# Patient Record
Sex: Male | Born: 1935
Health system: Southern US, Community
[De-identification: ages and names within clinical notes are randomized; demographics above are authoritative.]

## PROBLEM LIST (undated history)

## (undated) DIAGNOSIS — I10 Essential (primary) hypertension: Secondary | ICD-10-CM

## (undated) DIAGNOSIS — R7302 Impaired glucose tolerance (oral): Secondary | ICD-10-CM

## (undated) DIAGNOSIS — D692 Other nonthrombocytopenic purpura: Secondary | ICD-10-CM

## (undated) DIAGNOSIS — I4819 Other persistent atrial fibrillation: Secondary | ICD-10-CM

## (undated) DIAGNOSIS — M2550 Pain in unspecified joint: Secondary | ICD-10-CM

## (undated) DIAGNOSIS — H8110 Benign paroxysmal vertigo, unspecified ear: Secondary | ICD-10-CM

## (undated) DIAGNOSIS — E785 Hyperlipidemia, unspecified: Secondary | ICD-10-CM

## (undated) HISTORY — DX: Essential (primary) hypertension: I10

## (undated) HISTORY — DX: Pain in unspecified joint: M25.50

## (undated) HISTORY — PX: TONSILLECTOMY: SUR1361

## (undated) HISTORY — DX: Other persistent atrial fibrillation: I48.19

## (undated) HISTORY — DX: Impaired glucose tolerance (oral): R73.02

## (undated) HISTORY — DX: Benign paroxysmal vertigo, unspecified ear: H81.10

## (undated) HISTORY — PX: ORIF METACARPAL FRACTURE: SUR940

## (undated) HISTORY — DX: Hyperlipidemia, unspecified: E78.5

## (undated) HISTORY — DX: Other nonthrombocytopenic purpura: D69.2

---

## 2006-09-01 ENCOUNTER — Ambulatory Visit: Payer: Self-pay | Admitting: Internal Medicine

## 2006-09-15 ENCOUNTER — Ambulatory Visit: Payer: Self-pay | Admitting: Internal Medicine

## 2010-07-21 ENCOUNTER — Emergency Department (HOSPITAL_BASED_OUTPATIENT_CLINIC_OR_DEPARTMENT_OTHER)
Admission: EM | Admit: 2010-07-21 | Discharge: 2010-07-21 | Disposition: A | Discharge status: Other Acute Inpatient Hospital | Payer: Medicare Other | Attending: Emergency Medicine | Admitting: Emergency Medicine

## 2010-07-21 ENCOUNTER — Emergency Department (INDEPENDENT_AMBULATORY_CARE_PROVIDER_SITE_OTHER): Payer: Medicare Other

## 2010-07-21 DIAGNOSIS — W010XXA Fall on same level from slipping, tripping and stumbling without subsequent striking against object, initial encounter: Secondary | ICD-10-CM

## 2010-07-21 DIAGNOSIS — E7800 Pure hypercholesterolemia, unspecified: Secondary | ICD-10-CM | POA: Insufficient documentation

## 2010-07-21 DIAGNOSIS — Z8739 Personal history of other diseases of the musculoskeletal system and connective tissue: Secondary | ICD-10-CM | POA: Insufficient documentation

## 2010-07-21 DIAGNOSIS — S2249XA Multiple fractures of ribs, unspecified side, initial encounter for closed fracture: Secondary | ICD-10-CM | POA: Insufficient documentation

## 2010-07-21 DIAGNOSIS — R079 Chest pain, unspecified: Secondary | ICD-10-CM

## 2010-07-21 DIAGNOSIS — W19XXXA Unspecified fall, initial encounter: Secondary | ICD-10-CM | POA: Insufficient documentation

## 2010-07-21 DIAGNOSIS — Y92009 Unspecified place in unspecified non-institutional (private) residence as the place of occurrence of the external cause: Secondary | ICD-10-CM | POA: Insufficient documentation

## 2010-07-21 LAB — BASIC METABOLIC PANEL
BUN: 12 mg/dL (ref 6–23)
Calcium: 9.7 mg/dL (ref 8.4–10.5)
Creatinine, Ser: 0.8 mg/dL (ref 0.4–1.5)
GFR calc non Af Amer: >60 mL/min (ref >60)

## 2010-07-21 LAB — URINALYSIS, ROUTINE W REFLEX MICROSCOPIC
Leukocytes, UA: NEGATIVE
Nitrite: NEGATIVE
Specific Gravity, Urine: 1.024 (ref 1.005–1.030)
Urobilinogen, UA: 1 mg/dL (ref 0.0–1.0)
pH: 7 (ref 5.0–8.0)

## 2010-07-21 LAB — URINE MICROSCOPIC-ADD ON

## 2010-07-21 MED ORDER — IOHEXOL 300 MG/ML  SOLN
100.0000 mL | Freq: Once | INTRAMUSCULAR | Status: AC | PRN
  Administered 2010-07-21: 100 mL via INTRAVENOUS

## 2012-08-22 ENCOUNTER — Encounter (INDEPENDENT_AMBULATORY_CARE_PROVIDER_SITE_OTHER): Payer: Medicare Other | Admitting: *Deleted

## 2012-08-22 ENCOUNTER — Encounter: Payer: Self-pay | Admitting: Internal Medicine

## 2012-08-22 DIAGNOSIS — M79609 Pain in unspecified limb: Secondary | ICD-10-CM

## 2012-09-01 ENCOUNTER — Encounter: Payer: Self-pay | Admitting: Cardiovascular Disease

## 2012-09-01 ENCOUNTER — Ambulatory Visit (INDEPENDENT_AMBULATORY_CARE_PROVIDER_SITE_OTHER): Payer: Medicare Other | Admitting: Cardiovascular Disease

## 2012-09-01 VITALS — BP 152/74 | HR 72 | Ht 74.0 in | Wt 186.0 lb

## 2012-09-01 DIAGNOSIS — R0989 Other specified symptoms and signs involving the circulatory and respiratory systems: Secondary | ICD-10-CM

## 2012-09-01 DIAGNOSIS — E785 Hyperlipidemia, unspecified: Secondary | ICD-10-CM | POA: Insufficient documentation

## 2012-09-01 DIAGNOSIS — R55 Syncope and collapse: Secondary | ICD-10-CM

## 2012-09-01 DIAGNOSIS — R42 Dizziness and giddiness: Secondary | ICD-10-CM | POA: Insufficient documentation

## 2012-09-01 NOTE — Progress Notes (Signed)
   09/01/2012 Sean Bowman   May 30, 1927  [id number]  Primary Physician ARONSON,RICHARD A, MD Primary Cardiologist: Runell Gess MD Roseanne Reno   HPI:  Mr. Sean Bowman is an 79 year old married Caucasian male father of 2, grandfather to 5  grandchildren who was referred by Dr. Bernadene Person for cardiovascular evaluation because of several episodes of "pre-syncope" occurring 9 months ago.his crit aftereffect avows remarkable for treated hyperlipidemia otherwise is negative. He's never had a heart attack or stroke and denies chest pain or shortness of breath. Approximately 9 months ago he had several brief episodes of profound fatigue/presyncope but never actually lost consciousness. There were no other associated symptoms.   Current Outpatient Prescriptions  Medication Sig Dispense Refill  . aspirin 81 MG tablet Take 81 mg by mouth daily.      Marland Kitchen latanoprost (XALATAN) 0.005 % ophthalmic solution Place 1 drop into both eyes at bedtime.      . methocarbamol (ROBAXIN) 500 MG tablet Take 500 mg by mouth as needed.      . sildenafil (VIAGRA) 100 MG tablet Take 100 mg by mouth daily as needed for erectile dysfunction.      . simvastatin (ZOCOR) 40 MG tablet Take 40 mg by mouth every evening.       No current facility-administered medications for this visit.    No Known Allergies  History   Social History  . Marital Status: Married    Spouse Name: N/A    Number of Children: N/A  . Years of Education: N/A   Occupational History  . Not on file.   Social History Main Topics  . Smoking status: Never Smoker   . Smokeless tobacco: Not on file  . Alcohol Use: Yes     Comment: socially  . Drug Use: Not on file  . Sexually Active: Not on file   Other Topics Concern  . Not on file   Social History Narrative  . No narrative on file     Review of Systems: General: negative for chills, fever, night sweats or weight changes.  Cardiovascular: negative for chest pain, dyspnea on  exertion, edema, orthopnea, palpitations, paroxysmal nocturnal dyspnea or shortness of breath Dermatological: negative for rash Respiratory: negative for cough or wheezing Urologic: negative for hematuria Abdominal: negative for nausea, vomiting, diarrhea, bright red blood per rectum, melena, or hematemesis Neurologic: negative for visual changes, syncope, or dizziness All other systems reviewed and are otherwise negative except as noted above.    Blood pressure 152/74, pulse 72, height 6\' 2"  (1.88 m), weight 186 lb (84.369 kg).  General appearance: alert and no distress Neck: no adenopathy, no JVD, supple, symmetrical, trachea midline and soft right carotid bruit Lungs: clear to auscultation bilaterally Heart: regular rate and rhythm, S1, S2 normal, no murmur, click, rub or gallop Abdomen: soft, non-tender; bowel sounds normal; no masses,  no organomegaly Extremities: extremities normal, atraumatic, no cyanosis or edema Pulses: 2+ and symmetric  EKG normal sinus rhythm at 72 without ST or T wave changes  ASSESSMENT AND PLAN:   No problem-specific assessment & plan notes found for this encounter.      Runell Gess MD FACP,FACC,FAHA, [facility] 09/01/2012 2:01 PM

## 2012-09-01 NOTE — Patient Instructions (Addendum)
  We will see you back in follow up as needed  Dr Allyson Sabal has ordered a carotid doppler (ultrasound of your neck arteries)

## 2012-09-12 ENCOUNTER — Ambulatory Visit (HOSPITAL_COMMUNITY)
Admission: RE | Admit: 2012-09-12 | Discharge: 2012-09-12 | Disposition: A | Discharge status: Home / Self Care | Payer: Medicare Other | Source: Ambulatory Visit | Attending: Cardiovascular Disease | Admitting: Cardiovascular Disease

## 2012-09-12 DIAGNOSIS — R0989 Other specified symptoms and signs involving the circulatory and respiratory systems: Secondary | ICD-10-CM

## 2012-09-12 NOTE — Progress Notes (Signed)
Carotid Duplex Completed. Noelie Renfrow, RDMS, RVT  

## 2014-04-23 ENCOUNTER — Ambulatory Visit (HOSPITAL_COMMUNITY)
Admission: RE | Admit: 2014-04-23 | Discharge: 2014-04-23 | Disposition: A | Discharge status: Home / Self Care | Payer: Medicare Other | Source: Ambulatory Visit | Attending: Vascular Surgery | Admitting: Vascular Surgery

## 2014-04-23 ENCOUNTER — Other Ambulatory Visit (HOSPITAL_COMMUNITY): Payer: Self-pay | Admitting: Internal Medicine

## 2014-04-23 DIAGNOSIS — M7989 Other specified soft tissue disorders: Secondary | ICD-10-CM

## 2014-04-26 ENCOUNTER — Other Ambulatory Visit: Payer: Self-pay | Admitting: Dermatology

## 2014-12-19 ENCOUNTER — Other Ambulatory Visit: Payer: Self-pay | Admitting: Dermatology

## 2015-03-09 HISTORY — PX: CATARACT EXTRACTION W/ INTRAOCULAR LENS  IMPLANT, BILATERAL: SHX1307

## 2015-04-03 ENCOUNTER — Other Ambulatory Visit: Payer: Self-pay | Admitting: Dermatology

## 2015-06-03 DIAGNOSIS — H18411 Arcus senilis, right eye: Secondary | ICD-10-CM | POA: Diagnosis not present

## 2015-06-03 DIAGNOSIS — H401131 Primary open-angle glaucoma, bilateral, mild stage: Secondary | ICD-10-CM | POA: Diagnosis not present

## 2015-06-03 DIAGNOSIS — H02839 Dermatochalasis of unspecified eye, unspecified eyelid: Secondary | ICD-10-CM | POA: Diagnosis not present

## 2015-06-03 DIAGNOSIS — H18412 Arcus senilis, left eye: Secondary | ICD-10-CM | POA: Diagnosis not present

## 2015-06-05 DIAGNOSIS — D0362 Melanoma in situ of left upper limb, including shoulder: Secondary | ICD-10-CM | POA: Diagnosis not present

## 2015-07-18 DIAGNOSIS — I10 Essential (primary) hypertension: Secondary | ICD-10-CM | POA: Diagnosis not present

## 2015-07-18 DIAGNOSIS — M2550 Pain in unspecified joint: Secondary | ICD-10-CM | POA: Diagnosis not present

## 2015-07-18 DIAGNOSIS — E784 Other hyperlipidemia: Secondary | ICD-10-CM | POA: Diagnosis not present

## 2015-07-18 DIAGNOSIS — H8110 Benign paroxysmal vertigo, unspecified ear: Secondary | ICD-10-CM | POA: Diagnosis not present

## 2015-07-18 DIAGNOSIS — Z6823 Body mass index (BMI) 23.0-23.9, adult: Secondary | ICD-10-CM | POA: Diagnosis not present

## 2015-07-18 DIAGNOSIS — R7302 Impaired glucose tolerance (oral): Secondary | ICD-10-CM | POA: Diagnosis not present

## 2015-07-18 DIAGNOSIS — Z1389 Encounter for screening for other disorder: Secondary | ICD-10-CM | POA: Diagnosis not present

## 2015-07-18 DIAGNOSIS — D692 Other nonthrombocytopenic purpura: Secondary | ICD-10-CM | POA: Diagnosis not present

## 2015-08-06 DIAGNOSIS — Z85820 Personal history of malignant melanoma of skin: Secondary | ICD-10-CM | POA: Diagnosis not present

## 2015-08-06 DIAGNOSIS — D239 Other benign neoplasm of skin, unspecified: Secondary | ICD-10-CM | POA: Diagnosis not present

## 2015-08-06 DIAGNOSIS — L570 Actinic keratosis: Secondary | ICD-10-CM | POA: Diagnosis not present

## 2015-08-06 DIAGNOSIS — L821 Other seborrheic keratosis: Secondary | ICD-10-CM | POA: Diagnosis not present

## 2015-08-12 DIAGNOSIS — M25511 Pain in right shoulder: Secondary | ICD-10-CM | POA: Diagnosis not present

## 2015-08-12 DIAGNOSIS — Z6823 Body mass index (BMI) 23.0-23.9, adult: Secondary | ICD-10-CM | POA: Diagnosis not present

## 2015-12-23 DIAGNOSIS — H18413 Arcus senilis, bilateral: Secondary | ICD-10-CM | POA: Diagnosis not present

## 2015-12-23 DIAGNOSIS — H02839 Dermatochalasis of unspecified eye, unspecified eyelid: Secondary | ICD-10-CM | POA: Diagnosis not present

## 2015-12-23 DIAGNOSIS — H401131 Primary open-angle glaucoma, bilateral, mild stage: Secondary | ICD-10-CM | POA: Diagnosis not present

## 2016-01-12 DIAGNOSIS — I10 Essential (primary) hypertension: Secondary | ICD-10-CM | POA: Diagnosis not present

## 2016-01-12 DIAGNOSIS — Z0000 Encounter for general adult medical examination without abnormal findings: Secondary | ICD-10-CM | POA: Diagnosis not present

## 2016-01-12 DIAGNOSIS — Z125 Encounter for screening for malignant neoplasm of prostate: Secondary | ICD-10-CM | POA: Diagnosis not present

## 2016-01-12 DIAGNOSIS — R7302 Impaired glucose tolerance (oral): Secondary | ICD-10-CM | POA: Diagnosis not present

## 2016-01-28 DIAGNOSIS — Z0000 Encounter for general adult medical examination without abnormal findings: Secondary | ICD-10-CM | POA: Diagnosis not present

## 2016-01-28 DIAGNOSIS — D692 Other nonthrombocytopenic purpura: Secondary | ICD-10-CM | POA: Diagnosis not present

## 2016-01-28 DIAGNOSIS — Z6823 Body mass index (BMI) 23.0-23.9, adult: Secondary | ICD-10-CM | POA: Diagnosis not present

## 2016-01-28 DIAGNOSIS — E784 Other hyperlipidemia: Secondary | ICD-10-CM | POA: Diagnosis not present

## 2016-01-28 DIAGNOSIS — I10 Essential (primary) hypertension: Secondary | ICD-10-CM | POA: Diagnosis not present

## 2016-01-28 DIAGNOSIS — I4891 Unspecified atrial fibrillation: Secondary | ICD-10-CM | POA: Diagnosis not present

## 2016-01-28 DIAGNOSIS — R7302 Impaired glucose tolerance (oral): Secondary | ICD-10-CM | POA: Diagnosis not present

## 2016-01-28 DIAGNOSIS — M25511 Pain in right shoulder: Secondary | ICD-10-CM | POA: Diagnosis not present

## 2016-02-04 ENCOUNTER — Other Ambulatory Visit: Payer: Self-pay | Admitting: Dermatology

## 2016-02-04 DIAGNOSIS — H2511 Age-related nuclear cataract, right eye: Secondary | ICD-10-CM | POA: Diagnosis not present

## 2016-02-04 DIAGNOSIS — C44329 Squamous cell carcinoma of skin of other parts of face: Secondary | ICD-10-CM | POA: Diagnosis not present

## 2016-02-04 DIAGNOSIS — D0439 Carcinoma in situ of skin of other parts of face: Secondary | ICD-10-CM | POA: Diagnosis not present

## 2016-02-04 DIAGNOSIS — D0421 Carcinoma in situ of skin of right ear and external auricular canal: Secondary | ICD-10-CM | POA: Diagnosis not present

## 2016-02-10 ENCOUNTER — Ambulatory Visit
Admission: RE | Admit: 2016-02-10 | Discharge: 2016-02-10 | Disposition: A | Discharge status: Home / Self Care | Payer: Medicare Other | Source: Ambulatory Visit | Attending: Internal Medicine | Admitting: Internal Medicine

## 2016-02-10 ENCOUNTER — Other Ambulatory Visit: Payer: Self-pay | Admitting: Internal Medicine

## 2016-02-10 DIAGNOSIS — I4891 Unspecified atrial fibrillation: Secondary | ICD-10-CM

## 2016-02-10 DIAGNOSIS — R0602 Shortness of breath: Secondary | ICD-10-CM | POA: Diagnosis not present

## 2016-02-13 ENCOUNTER — Encounter: Payer: Self-pay | Admitting: Internal Medicine

## 2016-02-16 DIAGNOSIS — Z1212 Encounter for screening for malignant neoplasm of rectum: Secondary | ICD-10-CM | POA: Diagnosis not present

## 2016-02-16 DIAGNOSIS — H2511 Age-related nuclear cataract, right eye: Secondary | ICD-10-CM | POA: Diagnosis not present

## 2016-02-17 DIAGNOSIS — H2512 Age-related nuclear cataract, left eye: Secondary | ICD-10-CM | POA: Diagnosis not present

## 2016-02-18 DIAGNOSIS — D692 Other nonthrombocytopenic purpura: Secondary | ICD-10-CM | POA: Diagnosis not present

## 2016-02-18 DIAGNOSIS — I10 Essential (primary) hypertension: Secondary | ICD-10-CM | POA: Diagnosis not present

## 2016-02-18 DIAGNOSIS — R7302 Impaired glucose tolerance (oral): Secondary | ICD-10-CM | POA: Diagnosis not present

## 2016-02-18 DIAGNOSIS — I4891 Unspecified atrial fibrillation: Secondary | ICD-10-CM | POA: Diagnosis not present

## 2016-02-18 DIAGNOSIS — Z6823 Body mass index (BMI) 23.0-23.9, adult: Secondary | ICD-10-CM | POA: Diagnosis not present

## 2016-02-18 DIAGNOSIS — E784 Other hyperlipidemia: Secondary | ICD-10-CM | POA: Diagnosis not present

## 2016-02-18 DIAGNOSIS — H8113 Benign paroxysmal vertigo, bilateral: Secondary | ICD-10-CM | POA: Diagnosis not present

## 2016-02-18 DIAGNOSIS — M2550 Pain in unspecified joint: Secondary | ICD-10-CM | POA: Diagnosis not present

## 2016-02-19 ENCOUNTER — Ambulatory Visit (INDEPENDENT_AMBULATORY_CARE_PROVIDER_SITE_OTHER): Payer: Medicare Other | Admitting: Internal Medicine

## 2016-02-19 VITALS — BP 122/84 | HR 99 | Ht 74.0 in | Wt 182.4 lb

## 2016-02-19 DIAGNOSIS — I481 Persistent atrial fibrillation: Secondary | ICD-10-CM | POA: Diagnosis not present

## 2016-02-19 DIAGNOSIS — I4819 Other persistent atrial fibrillation: Secondary | ICD-10-CM

## 2016-02-19 DIAGNOSIS — I10 Essential (primary) hypertension: Secondary | ICD-10-CM | POA: Diagnosis not present

## 2016-02-19 MED ORDER — DILTIAZEM HCL ER COATED BEADS 180 MG PO CP24: 180.0000 mg | ORAL_CAPSULE | Freq: Every day | ORAL | 3 refills | Status: DC

## 2016-02-19 NOTE — Progress Notes (Signed)
Electrophysiology Office Note   Date:  02/19/2016   ID:  Sean Bowman, DOB 05-19-1927, MRN [id number]  PCP:  Sean Bowman, Sean Bowman  Cardiologist:  Sean Bowman Primary Electrophysiologist: Sean Bowman, Sean Bowman    Chief Complaint  Patient presents with  . Atrial Fibrillation     History of Present Illness: Sean Bowman is Bowman 82 y.o. male who presents today for electrophysiology evaluation.   He recently presented to Sean Bowman for routine follow-up and was found to have afib with RVR.  He was started on cardizem.  Plans for eliquis were made though he did not start this until last evening.  He is minimally symptomatic with his afib.  He has only occasional palpitations.  He is quite active for his age.  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  Today, he denies symptoms of chest pain, shortness of breath, orthopnea, PND, lower extremity edema, claudication, dizziness, presyncope, syncope, bleeding, or neurologic sequela. The patient is tolerating medications without difficulties and is otherwise without complaint today.    Past Medical History:  Diagnosis Date  . Arthralgia   . Benign positional vertigo   . HTN (hypertension)   . Hyperlipidemia   . Impaired glucose tolerance   . Persistent atrial fibrillation (HCC)   . Senile purpura (HCC)    Past Surgical History:  Procedure Laterality Date  . TOE SURGERY       Current Outpatient Prescriptions  Medication Sig Dispense Refill  . latanoprost (XALATAN) 0.005 % ophthalmic solution Place 1 drop into both eyes at bedtime.    . naproxen (NAPROSYN) 500 MG tablet Take 500 mg by mouth 2 (two) times daily with Bowman meal.    . sildenafil (VIAGRA) 100 MG tablet Take 100 mg by mouth daily as needed for erectile dysfunction.    . simvastatin (ZOCOR) 40 MG tablet Take 40 mg by mouth every evening.    . diltiazem (CARDIZEM CD) 180 MG 24 hr capsule Take 1 capsule (180 mg total) by mouth daily. 90 capsule 3   No current  facility-administered medications for this visit.     Allergies:   Patient has no known allergies.   Social History:  The patient  reports that he has never smoked. He has never used smokeless tobacco. He reports that he drinks alcohol. He reports that he does not use drugs.   Family History:  The patient's  family history includes Alzheimer's disease in his father and mother; Stroke in his mother.    ROS:  Please see the history of present illness.   All other systems are reviewed and negative.    PHYSICAL EXAM: VS:  BP 122/84   Pulse 99   Ht 6\' 2"  (1.88 m)   Wt 182 lb 6.4 oz (82.7 kg)   BMI 23.42 kg/m  , BMI Body mass index is 23.42 kg/m. GEN: Well nourished, well developed, in no acute distress  HEENT: normal  Neck: no JVD, carotid bruits, or masses Cardiac: iRRR; no murmurs, rubs, or gallops,no edema  Respiratory:  clear to auscultation bilaterally, normal work of breathing GI: soft, nontender, nondistended, + BS MS: no deformity or atrophy  Skin: warm and dry  Neuro:  Strength and sensation are intact Psych: euthymic mood, full affect  EKG:  EKG is ordered today. The ekg ordered today shows afib 99 bpm   Wt Readings from Last 3 Encounters:  02/19/16 182 lb 6.4 oz (82.7 kg)  09/01/12 186 lb (84.4 kg)  Other studies Reviewed: Additional studies/ records that were reviewed today include: 9 pages of records from [facility]   Review of the above records today demonstrates: ekg from 01/28/16 reveals afib with RVR   ASSESSMENT AND PLAN:  1.  Persistent afib Minimally symptomatic and determined on routine physical exam. Therapeutic strategies for afib including rate and rhythm control were discussed in detail with the patient today. Risk, benefits, and alternatives to each approach were discussed in detail.  He would like to proceed with cardioversion once appropriately anticoagulated.  chads2vasc score is 70 (age, htn).  Continue eliquis 5mg  BID and stop  ASA  Change cardizem to cardizem CD 180mg  daily for better rate control Echo to evaluate for structural abnormalities related to afib.  Return to see me 4 weeks after cardioversion  2. HTN Stable No change required today    Current medicines are reviewed at length with the patient today.   The patient does not have concerns regarding his medicines.  The following changes were made today:  none  Labs/ tests ordered today include:  Orders Placed This Encounter  Procedures  . Basic metabolic panel  . CBC with Differential  . EKG 12-Lead  . ECHOCARDIOGRAM COMPLETE     Signed, Sean Range, Sean Bowman  02/19/2016 4:42 PM     [facility] [address] [place] [address] [phone] (office) [phone] (fax)

## 2016-02-19 NOTE — Patient Instructions (Addendum)
Medication Instructions:  Your physician has recommended you make the following change in your medication:  1) Stop Aspirin 2) Increase Cardizem to 180 mg daily  Labwork: Your physician recommends that you return for lab work on 03/09/16--do not have to fast   Testing/Procedures:   Your physician has requested that you have an echocardiogram. Echocardiography is a painless test that uses sound waves to create images of your heart. It provides your doctor with information about the size and shape of your heart and how well your heart's chambers and valves are working. This procedure takes approximately one hour. There are no restrictions for this procedure.   Your physician has recommended that you have a Cardioversion (DCCV). Electrical Cardioversion uses a jolt of electricity to your heart either through paddles or wired patches attached to your chest. This is a controlled, usually prescheduled, procedure. Defibrillation is done under light anesthesia in the hospital, and you usually go home the day of the procedure. This is done to get your heart back into a normal rhythm. You are not awake for the procedure. Please see the instruction sheet given to you today.---03/15/16   Please arrive at [facility] at ________ Do not eat or drink after midnight the night before your procedure Okay to take your medications with a small sip of water the morning of procedure Will need someone to drive you home after the proceudre   Follow-Up: Your physician recommends that you schedule a follow-up appointment in: 8 weeks with Dr Johney Frame   Any Other Special Instructions Will Be Listed Below (If Applicable).     If you need a refill on your cardiac medications before your next appointment, please call your pharmacy.

## 2016-02-23 ENCOUNTER — Telehealth: Payer: Self-pay | Admitting: Internal Medicine

## 2016-02-23 NOTE — Telephone Encounter (Signed)
New Message  Pt call requesting to speak with RN about his medication. Pt did not want to disclose much information . Pt would rather speak with RN. Please call back to discuss

## 2016-02-23 NOTE — Telephone Encounter (Signed)
Left a message for the patient and let him know I will work on scheduling his DCCV for Jan tomorrow and call him back

## 2016-02-24 NOTE — Telephone Encounter (Signed)
Called patient back to discuss DCCV.  I have it scheduled for 03/15/16 at 10:00.  Patient is to be at the [facility] at 8:30am  Labs are on 01/02

## 2016-02-25 ENCOUNTER — Telehealth: Payer: Self-pay | Admitting: Internal Medicine

## 2016-02-25 NOTE — Telephone Encounter (Signed)
New message     Pt verbalized that he is returning call for rn and [phone] is a cell

## 2016-02-25 NOTE — Telephone Encounter (Signed)
New Message;    He need to talk to you about the dates for his procedure.

## 2016-02-25 NOTE — Telephone Encounter (Signed)
Left message for patient to return my call.

## 2016-02-27 NOTE — Telephone Encounter (Signed)
Late entry: Spoke with patient and he is going to call his eye MD and get back to me.

## 2016-02-27 NOTE — Telephone Encounter (Signed)
Spoke with patient and he is still not taking the Eliquis bid.  We discussed this in great detail for over 15 min.  He is going to check his medications again and let me know that he has Eliquis.  Seems very confused in regards to what he is taking.  I let him know that the DCCV can not be scheduled until he has been taking the blood thinner twice daily fior 3 weeks.  He thinks 1/18 or 1/19 will work.  He will call me back Tues after Christmas

## 2016-03-02 MED ORDER — APIXABAN 5 MG PO TABS: 5.0000 mg | ORAL_TABLET | Freq: Two times a day (BID) | ORAL | 2 refills | Status: DC

## 2016-03-02 NOTE — Telephone Encounter (Signed)
Patient walked in this morning to discuss his medications.  He is not taking Eliquis as Dr Jenel Lucks not says to continue.  It was my understanding Dr Jacky Kindle had started this medication for him.  He says he is not taking.  I have given him samples a 30 day free card and let him know will not be able to schedule the DCCV until week of 03/29/16.  I let him know it is very important that he not miss any doses of the Eliquis prior to the DCCV.

## 2016-03-09 ENCOUNTER — Other Ambulatory Visit: Payer: Medicare Other | Admitting: *Deleted

## 2016-03-09 DIAGNOSIS — I481 Persistent atrial fibrillation: Secondary | ICD-10-CM | POA: Diagnosis not present

## 2016-03-09 DIAGNOSIS — I4819 Other persistent atrial fibrillation: Secondary | ICD-10-CM

## 2016-03-09 NOTE — Addendum Note (Signed)
Addended by: Tonita Phoenix on: 03/09/2016 09:02 AM   Modules accepted: Orders

## 2016-03-10 LAB — CBC WITH DIFFERENTIAL/PLATELET
BASOS ABS: 0 10*3/uL (ref 0.0–0.2)
Basos: 0 %
EOS (ABSOLUTE): 0.5 10*3/uL — ABNORMAL HIGH (ref 0.0–0.4)
Eos: 5 %
HEMOGLOBIN: 13.8 g/dL (ref 13.0–17.7)
Hematocrit: 41.2 % (ref 37.5–51.0)
Immature Grans (Abs): 0 10*3/uL (ref 0.0–0.1)
Immature Granulocytes: 0 %
LYMPHS ABS: 2.6 10*3/uL (ref 0.7–3.1)
Lymphs: 27 %
MCH: 31.6 pg (ref 26.6–33.0)
MCHC: 33.5 g/dL (ref 31.5–35.7)
MCV: 94 fL (ref 79–97)
MONOCYTES: 12 %
Monocytes Absolute: 1.1 10*3/uL — ABNORMAL HIGH (ref 0.1–0.9)
NEUTROS ABS: 5.5 10*3/uL (ref 1.4–7.0)
Neutrophils: 56 %
Platelets: 221 10*3/uL (ref 150–379)
RBC: 4.37 x10E6/uL (ref 4.14–5.80)
RDW: 14 % (ref 12.3–15.4)
WBC: 9.8 10*3/uL (ref 3.4–10.8)

## 2016-03-10 LAB — BASIC METABOLIC PANEL
BUN / CREAT RATIO: 18 (ref 10–24)
BUN: 20 mg/dL (ref 8–27)
CHLORIDE: 102 mmol/L (ref 96–106)
CO2: 25 mmol/L (ref 18–29)
Calcium: 9.2 mg/dL (ref 8.6–10.2)
Creatinine, Ser: 1.14 mg/dL (ref 0.76–1.27)
GFR calc non Af Amer: 58 mL/min/{1.73_m2} — ABNORMAL LOW (ref >59)
GFR, EST AFRICAN AMERICAN: 67 mL/min/{1.73_m2} (ref >59)
Glucose: 90 mg/dL (ref 65–99)
POTASSIUM: 4.4 mmol/L (ref 3.5–5.2)
Sodium: 142 mmol/L (ref 134–144)

## 2016-03-11 ENCOUNTER — Telehealth: Payer: Self-pay | Admitting: Internal Medicine

## 2016-03-11 MED ORDER — APIXABAN 5 MG PO TABS: 5.0000 mg | ORAL_TABLET | Freq: Two times a day (BID) | ORAL | 2 refills | Status: DC

## 2016-03-11 NOTE — Telephone Encounter (Signed)
He is going to resend me a fax with some questions.

## 2016-03-11 NOTE — Telephone Encounter (Signed)
New Message  Pt call requesting to speak with RN to f/u to see if his fax was received. Please call back to confirm.

## 2016-03-12 DIAGNOSIS — H2512 Age-related nuclear cataract, left eye: Secondary | ICD-10-CM | POA: Diagnosis not present

## 2016-03-12 DIAGNOSIS — H25812 Combined forms of age-related cataract, left eye: Secondary | ICD-10-CM | POA: Diagnosis not present

## 2016-03-13 ENCOUNTER — Other Ambulatory Visit: Payer: Self-pay | Admitting: Nurse Practitioner

## 2016-03-15 ENCOUNTER — Encounter (HOSPITAL_COMMUNITY): Admission: RE | Payer: Self-pay | Source: Ambulatory Visit

## 2016-03-15 ENCOUNTER — Ambulatory Visit (HOSPITAL_COMMUNITY): Admission: RE | Admit: 2016-03-15 | Payer: Medicare Other | Source: Ambulatory Visit | Admitting: Cardiology

## 2016-03-15 ENCOUNTER — Encounter (HOSPITAL_COMMUNITY): Payer: Self-pay | Admitting: Certified Registered"

## 2016-03-15 SURGERY — CARDIOVERSION
Anesthesia: Monitor Anesthesia Care

## 2016-03-15 NOTE — Telephone Encounter (Signed)
Discussed with Dr Johney Frame and will need to take the Eliquis for at least 4 weeks post DCCV.  DCCV is scheduled for 03/29/16 with Dr Royann Shivers at 10:00am. He is to be at the [facility] at 8:30am.  NPO after midnight but okay to take all morning medications with a small sip of water.  Spoke with patient and let him know date and time and he would like to discuss with Dr Johney Frame.  He says he wants to have an appointment with MD doing DCCV.  I have tried to explain to the patient that this is not necessary as Dr Johney Frame has explained the procedure to him.  He would like for Dr Johney Frame to call him

## 2016-03-19 ENCOUNTER — Telehealth: Payer: Self-pay | Admitting: Internal Medicine

## 2016-03-19 NOTE — Telephone Encounter (Signed)
Returned call to patient no answer.LMTC. 

## 2016-03-19 NOTE — Telephone Encounter (Signed)
New Message   Pt has some questions pertaining to upcoming test. Would like a call back.

## 2016-03-22 ENCOUNTER — Telehealth (HOSPITAL_COMMUNITY): Payer: Self-pay | Admitting: Internal Medicine

## 2016-03-22 NOTE — Telephone Encounter (Signed)
Follow up ° °Pt voiced returning nurses call. °

## 2016-03-22 NOTE — Telephone Encounter (Signed)
Pt called to discuss cardioversion. Pt states he would like to speak to Dr Johney Frame personally about risks. Pt states echocardiogram has not been scheduled yet, pt advised I will ask [facility] to contact him to get echo scheduled.   Pt advised I will forward to Dr Allred/Kelly to follow up with him on Wednesday.

## 2016-03-23 ENCOUNTER — Telehealth: Payer: Self-pay | Admitting: Internal Medicine

## 2016-03-23 NOTE — Telephone Encounter (Signed)
New Message:  I called Sean Bowman to get him schedule for an echo and the Sean Bowman asked if this is something Dr. Johney Frame wanted him to have prior to his Cardioversion. Please f/u with Sean Bowman and further advise.

## 2016-03-23 NOTE — Telephone Encounter (Signed)
Pt scheduled for DCCV on 1/22 and for echo on 1/26.  Pt wanted to know if echo needed to be before DCCV.  Advised pt that it did not, either way would have been fine. Pt concerned about making sure Dr. Johney Frame was aware that echo would be after DCCV.  Advised that I will send this message to Dr. Johney Frame and his nurse and have her call if by chance echo did need to be prior to DCCV.  Pt appreciative for assistance.

## 2016-03-23 NOTE — Telephone Encounter (Signed)
New Message   Pt returning call from [place].

## 2016-03-26 ENCOUNTER — Other Ambulatory Visit: Payer: Self-pay | Admitting: Nurse Practitioner

## 2016-03-29 ENCOUNTER — Ambulatory Visit (HOSPITAL_COMMUNITY): Payer: Medicare Other | Admitting: Certified Registered"

## 2016-03-29 ENCOUNTER — Ambulatory Visit (HOSPITAL_COMMUNITY)
Admission: RE | Admit: 2016-03-29 | Discharge: 2016-03-29 | Disposition: A | Discharge status: Home / Self Care | Payer: Medicare Other | Source: Ambulatory Visit | Attending: Cardiovascular Disease | Admitting: Cardiovascular Disease

## 2016-03-29 ENCOUNTER — Telehealth (HOSPITAL_COMMUNITY): Payer: Self-pay | Admitting: *Deleted

## 2016-03-29 ENCOUNTER — Encounter (HOSPITAL_COMMUNITY)
Admission: RE | Disposition: A | Discharge status: Home / Self Care | Payer: Self-pay | Source: Ambulatory Visit | Attending: Cardiovascular Disease

## 2016-03-29 ENCOUNTER — Encounter (HOSPITAL_COMMUNITY): Payer: Self-pay | Admitting: *Deleted

## 2016-03-29 DIAGNOSIS — E785 Hyperlipidemia, unspecified: Secondary | ICD-10-CM | POA: Insufficient documentation

## 2016-03-29 DIAGNOSIS — H8110 Benign paroxysmal vertigo, unspecified ear: Secondary | ICD-10-CM | POA: Insufficient documentation

## 2016-03-29 DIAGNOSIS — R42 Dizziness and giddiness: Secondary | ICD-10-CM | POA: Diagnosis not present

## 2016-03-29 DIAGNOSIS — Z79899 Other long term (current) drug therapy: Secondary | ICD-10-CM | POA: Insufficient documentation

## 2016-03-29 DIAGNOSIS — Z7901 Long term (current) use of anticoagulants: Secondary | ICD-10-CM | POA: Insufficient documentation

## 2016-03-29 DIAGNOSIS — I10 Essential (primary) hypertension: Secondary | ICD-10-CM | POA: Insufficient documentation

## 2016-03-29 DIAGNOSIS — I481 Persistent atrial fibrillation: Secondary | ICD-10-CM

## 2016-03-29 DIAGNOSIS — I4891 Unspecified atrial fibrillation: Secondary | ICD-10-CM | POA: Diagnosis not present

## 2016-03-29 HISTORY — PX: CARDIOVERSION: SHX1299

## 2016-03-29 SURGERY — CARDIOVERSION
Anesthesia: General

## 2016-03-29 MED ORDER — SODIUM CHLORIDE 0.9 % IV SOLN: INTRAVENOUS | Status: DC

## 2016-03-29 MED ORDER — PROPOFOL 10 MG/ML IV BOLUS
INTRAVENOUS | Status: DC | PRN
  Administered 2016-03-29: 70 mg via INTRAVENOUS

## 2016-03-29 MED ORDER — LIDOCAINE 2% (20 MG/ML) 5 ML SYRINGE
INTRAMUSCULAR | Status: DC | PRN
  Administered 2016-03-29: 40 mg via INTRAVENOUS

## 2016-03-29 MED ORDER — APIXABAN 5 MG PO TABS
5.0000 mg | ORAL_TABLET | Freq: Once | ORAL | Status: AC
  Administered 2016-03-29: 5 mg via ORAL
  Filled 2016-03-29 (×2): qty 1

## 2016-03-29 MED ORDER — DILTIAZEM HCL ER COATED BEADS 180 MG PO CP24: 180.0000 mg | ORAL_CAPSULE | Freq: Two times a day (BID) | ORAL | 3 refills | Status: DC

## 2016-03-29 NOTE — Anesthesia Preprocedure Evaluation (Signed)
Anesthesia Evaluation  Patient identified by MRN, date of birth, ID band Patient awake    Reviewed: Allergy & Precautions, NPO status , Patient's Chart, lab work & pertinent test results  Airway Mallampati: I  TM Distance: >3 FB     Dental  (+) Teeth Intact   Pulmonary neg pulmonary ROS,    breath sounds clear to auscultation       Cardiovascular hypertension, + Peripheral Vascular Disease  + dysrhythmias  Rhythm:Irregular Rate:Normal     Neuro/Psych negative neurological ROS     GI/Hepatic negative GI ROS, Neg liver ROS,   Endo/Other  negative endocrine ROS  Renal/GU negative Renal ROS     Musculoskeletal   Abdominal   Peds  Hematology   Anesthesia Other Findings   Reproductive/Obstetrics                             Anesthesia Physical Anesthesia Plan  ASA: II  Anesthesia Plan: General   Post-op Pain Management:    Induction: Intravenous  Airway Management Planned: Simple Face Mask  Additional Equipment:   Intra-op Plan:   Post-operative Plan:   Informed Consent: I have reviewed the patients History and Physical, chart, labs and discussed the procedure including the risks, benefits and alternatives for the proposed anesthesia with the patient or authorized representative who has indicated his/her understanding and acceptance.   Dental advisory given  Plan Discussed with: CRNA  Anesthesia Plan Comments:         Anesthesia Quick Evaluation

## 2016-03-29 NOTE — Anesthesia Postprocedure Evaluation (Addendum)
Anesthesia Post Note  Patient: Sean Bowman  Procedure(s) Performed: Procedure(s) (LRB): CARDIOVERSION (N/A)  Patient location during evaluation: Endoscopy Anesthesia Type: General Level of consciousness: awake and alert Pain management: pain level controlled Vital Signs Assessment: post-procedure vital signs reviewed and stable Respiratory status: spontaneous breathing, nonlabored ventilation, respiratory function stable and patient connected to nasal cannula oxygen Cardiovascular status: stable and blood pressure returned to baseline Anesthetic complications: no       Last Vitals:  Vitals:   03/29/16 0950 03/29/16 1000  BP: 107/74 112/69  Pulse: (!) 114 (!) 110  Resp: 15 14  Temp: 36.3 C     Last Pain:  Vitals:   03/29/16 0950  TempSrc: Oral                 Hilmar Moldovan,Cage TERRILL

## 2016-03-29 NOTE — Discharge Instructions (Signed)
Electrical Cardioversion, Care After °This sheet gives you information about how to care for yourself after your procedure. Your health care provider may also give you more specific instructions. If you have problems or questions, contact your health care provider. °What can I expect after the procedure? °After the procedure, it is common to have: °· Some redness on the skin where the shocks were given. °Follow these instructions at home: °· Do not drive for 24 hours if you were given a medicine to help you relax (sedative). °· Take over-the-counter and prescription medicines only as told by your health care provider. °· Ask your health care provider how to check your pulse. Check it often. °· Rest for 48 hours after the procedure or as told by your health care provider. °· Avoid or limit your caffeine use as told by your health care provider. °Contact a health care provider if: °· You feel like your heart is beating too quickly or your pulse is not regular. °· You have a serious muscle cramp that does not go away. °Get help right away if: °· You have discomfort in your chest. °· You are dizzy or you feel faint. °· You have trouble breathing or you are short of breath. °· Your speech is slurred. °· You have trouble moving an arm or leg on one side of your body. °· Your fingers or toes turn cold or blue. °This information is not intended to replace advice given to you by your health care provider. Make sure you discuss any questions you have with your health care provider. °Document Released: 12/13/2012 Document Revised: 09/26/2015 Document Reviewed: 08/29/2015 °Elsevier Interactive Patient Education © 2017 [employer] ° °

## 2016-03-29 NOTE — Op Note (Signed)
Procedure: Electrical Cardioversion Indications:  Atrial Fibrillation  Procedure Details:  Consent: Risks of procedure as well as the alternatives and risks of each were explained to the (patient/caregiver).  Consent for procedure obtained.  Time Out: Verified patient identification, verified procedure, site/side was marked, verified correct patient position, special equipment/implants available, medications/allergies/relevent history reviewed, required imaging and test results available.  Performed  Patient placed on cardiac monitor, pulse oximetry, supplemental oxygen as necessary.  Sedation given: IV propofol, Dr. Jacklynn Bue. Pacer pads placed anterior and posterior chest.  Cardioverted 1 time(s).  Cardioversion with synchronized biphasic 120J shock. The patient converted to NSR for approximately 1 minute, then returned to atrial fibrillation with RVR. No further attempts were made to convert the rhythm.  Evaluation: Findings: Post procedure EKG shows: Atrial Fibrillation Complications: None Patient did tolerate procedure well.  Consider repeat DC cardioversion after initiation of antiarrhythmics. The ventricular rate was 130-150s before and after the cardioversion. Will double the dose of diltiazem. Due to the risk of adverse drug interactions, will stop the simvastatin. If diltiazem will be long term therapy, consider an alternative statin.  Time Spent Directly with the Patient:  30 minutes   Sean Bowman 03/29/2016, 8:58 AM

## 2016-03-29 NOTE — Transfer of Care (Signed)
Immediate Anesthesia Transfer of Care Note  Patient: Sean Bowman  Procedure(s) Performed: Procedure(s): CARDIOVERSION (N/A)  Patient Location: Endoscopy Unit  Anesthesia Type:General  Level of Consciousness: awake, alert , oriented and patient cooperative  Airway & Oxygen Therapy: Patient Spontanous Breathing and Patient connected to nasal cannula oxygen  Post-op Assessment: Report given to RN, Post -op Vital signs reviewed and stable and Patient moving all extremities  Post vital signs: Reviewed and stable  Last Vitals:  Vitals:   03/29/16 0816  BP: (!) 132/95  Pulse: (!) 121  Resp: 18  Temp: 36.4 C    Last Pain:  Vitals:   03/29/16 0816  TempSrc: Oral         Complications: No apparent anesthesia complications

## 2016-03-29 NOTE — H&P (Signed)
   Chief Complaint:  atrial fibrillation Cardiologist: Berry/Allred  HPI:  This is a 83 y.o. male with a past medical history significant for HTN and persistent atrial fibrillation, here for elective DC cardioversion after >4 weeks of uninterrupted anticoagulation.   PMHx:  Past Medical History:  Diagnosis Date  . Arthralgia   . Benign positional vertigo   . HTN (hypertension)   . Hyperlipidemia   . Impaired glucose tolerance   . Persistent atrial fibrillation (HCC)   . Senile purpura (HCC)     Past Surgical History:  Procedure Laterality Date  . TOE SURGERY      FAMHx:  Family History  Problem Relation Age of Onset  . Stroke Mother   . Alzheimer's disease Mother   . Alzheimer's disease Father     SOCHx:   reports that he has never smoked. He has never used smokeless tobacco. He reports that he drinks alcohol. He reports that he does not use drugs.  ALLERGIES:  No Known Allergies  ROS: Pertinent items are noted in HPI.  HOME MEDS: Medications Prior to Admission  Medication Sig Dispense Refill  . apixaban (ELIQUIS) 5 MG TABS tablet Take 1 tablet (5 mg total) by mouth 2 (two) times daily. 60 tablet 2  . diltiazem (CARDIZEM CD) 180 MG 24 hr capsule Take 1 capsule (180 mg total) by mouth daily. 90 capsule 3  . latanoprost (XALATAN) 0.005 % ophthalmic solution Place 1 drop into both eyes at bedtime.    . sildenafil (VIAGRA) 100 MG tablet Take 100 mg by mouth daily as needed for erectile dysfunction.    . simvastatin (ZOCOR) 40 MG tablet Take 40 mg by mouth every evening.      VITALS: Blood pressure (!) 132/95, pulse (!) 121, temperature 97.6 F (36.4 C), temperature source Oral, resp. rate 18, height 6\' 2"  (1.88 m), weight 82.6 kg (182 lb), SpO2 97 %.  EXAM:  General: Alert, oriented x3, no distress Head: no evidence of trauma, PERRL, EOMI, no exophtalmos or lid lag, no myxedema, no xanthelasma; normal ears, nose and oropharynx Neck:  jugular venous pulsations  and no hepatojugular reflux; brisk carotid pulses without delay and no carotid bruits Chest: clear to auscultation, no signs of consolidation by percussion or palpation, normal fremitus, symmetrical and full respiratory excursions Cardiovascular: normal position and quality of the apical impulse, rapid irregular rhythm, normal first heart sound and second heart sounds, no rubs or gallops, nomurmur Abdomen: no tenderness or distention, no masses by palpation, no abnormal pulsatility or arterial bruits, normal bowel sounds, no hepatosplenomegaly Extremities: no clubbing, cyanosis or edema; 2+ radial, ulnar and brachial pulses bilaterally; 2+ right femoral, posterior tibial and dorsalis pedis pulses; 2+ left femoral, posterior tibial and dorsalis pedis pulses; no subclavian or femoral bruits Neurological: grossly nonfocal   IMPRESSION: AFib with RVR. Appropriate uninterrupted anticoagulation.  PLAN: DC cardioversion. If unsuccessful, needs enhanced rate control meds. Probably should switch to an alternative statin if he remains on diltiazem. Either way, should remain on anticoagulants indefinitely (CHADSVasc 3 - age 12, HTN). This procedure has been fully reviewed with the patient and written informed consent has been obtained.   Thurmon Fair, MD, [facility] [phone] office [phone] pager  03/29/2016, 8:53 AM

## 2016-03-29 NOTE — Telephone Encounter (Signed)
-----   Message from Shona Simpson, RN sent at 03/29/2016 10:34 AM EST -----   ----- Message ----- From: Thurmon Fair, MD Sent: 03/29/2016  10:23 AM To: Hillis Range, MD, Lutricia Horsfall, #  Billie, Please make him an AF clinic appt later this week (early AF recurrence after DCCV) to discuss antiarrhythmics. Thanks [employer]

## 2016-03-29 NOTE — Telephone Encounter (Signed)
RE: appt with afib clinic this week.  LMOM for pt to clbk to sched appt.  LM on home and work.

## 2016-03-30 ENCOUNTER — Encounter (HOSPITAL_COMMUNITY): Payer: Self-pay | Admitting: Cardiovascular Disease

## 2016-03-31 ENCOUNTER — Ambulatory Visit (HOSPITAL_COMMUNITY): Payer: Medicare Other | Admitting: Nurse Practitioner

## 2016-04-01 ENCOUNTER — Encounter (HOSPITAL_COMMUNITY): Payer: Self-pay | Admitting: Nurse Practitioner

## 2016-04-01 ENCOUNTER — Telehealth: Payer: Self-pay | Admitting: Internal Medicine

## 2016-04-01 ENCOUNTER — Ambulatory Visit (HOSPITAL_COMMUNITY)
Admission: RE | Admit: 2016-04-01 | Discharge: 2016-04-01 | Disposition: A | Discharge status: Home / Self Care | Payer: Medicare Other | Source: Ambulatory Visit | Attending: Nurse Practitioner | Admitting: Nurse Practitioner

## 2016-04-01 VITALS — BP 130/74 | HR 90 | Ht 74.0 in | Wt 181.4 lb

## 2016-04-01 DIAGNOSIS — R600 Localized edema: Secondary | ICD-10-CM | POA: Diagnosis not present

## 2016-04-01 DIAGNOSIS — I481 Persistent atrial fibrillation: Secondary | ICD-10-CM | POA: Diagnosis not present

## 2016-04-01 DIAGNOSIS — I4819 Other persistent atrial fibrillation: Secondary | ICD-10-CM

## 2016-04-01 DIAGNOSIS — Z7901 Long term (current) use of anticoagulants: Secondary | ICD-10-CM | POA: Insufficient documentation

## 2016-04-01 DIAGNOSIS — Z79899 Other long term (current) drug therapy: Secondary | ICD-10-CM | POA: Diagnosis not present

## 2016-04-01 DIAGNOSIS — I4891 Unspecified atrial fibrillation: Secondary | ICD-10-CM | POA: Diagnosis present

## 2016-04-01 NOTE — Telephone Encounter (Signed)
New message     Did you receive the fax he sent you Tuesday about changing medication ?  Please call him to discuss this change ?

## 2016-04-02 ENCOUNTER — Other Ambulatory Visit: Payer: Self-pay

## 2016-04-02 ENCOUNTER — Ambulatory Visit (HOSPITAL_COMMUNITY): Payer: Medicare Other | Attending: Cardiovascular Disease

## 2016-04-02 DIAGNOSIS — I4819 Other persistent atrial fibrillation: Secondary | ICD-10-CM

## 2016-04-02 DIAGNOSIS — I481 Persistent atrial fibrillation: Secondary | ICD-10-CM | POA: Diagnosis not present

## 2016-04-02 DIAGNOSIS — I340 Nonrheumatic mitral (valve) insufficiency: Secondary | ICD-10-CM | POA: Insufficient documentation

## 2016-04-02 DIAGNOSIS — I517 Cardiomegaly: Secondary | ICD-10-CM | POA: Diagnosis not present

## 2016-04-02 DIAGNOSIS — E785 Hyperlipidemia, unspecified: Secondary | ICD-10-CM | POA: Diagnosis not present

## 2016-04-02 DIAGNOSIS — I4891 Unspecified atrial fibrillation: Secondary | ICD-10-CM | POA: Diagnosis present

## 2016-04-02 NOTE — Progress Notes (Signed)
Primary Care Physician: Minda Meo, MD Referring Physician:   [name] is a 83 y.o. male with a h/o persistent afib that was diagnosed in December of 2017. He was started on anticoagulation and weeks later had DCCV which was unsuccessful. At time of cardioversion Cardizem was doubled for better rate control and simvastatin was stopped due to drug drug interaction concerns.  He is in the afib clinic for f/u.He is rate controlled. He is unaware that he is in afib . 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 He continues on apixaban. Had a nosebleed this am which he was able to control. Echo is pending tomorrow. Mild ankle edema since increasing dose of cardizem.  Today, he denies symptoms of palpitations, chest pain, shortness of breath, orthopnea, PND, mild bilateral ankle edema, dizziness, presyncope, syncope, or neurologic sequela. The patient is tolerating medications without difficulties and is otherwise without complaint today.   Past Medical History:  Diagnosis Date  . Arthralgia   . Benign positional vertigo   . HTN (hypertension)   . Hyperlipidemia   . Impaired glucose tolerance   . Persistent atrial fibrillation (HCC)   . Senile purpura (HCC)    Past Surgical History:  Procedure Laterality Date  . CARDIOVERSION N/A 03/29/2016   Procedure: CARDIOVERSION;  Surgeon: Thurmon Fair, MD;  [facility];  Service: Cardiovascular;  Laterality: N/A;  . TOE SURGERY      Current Outpatient Prescriptions  Medication Sig Dispense Refill  . apixaban (ELIQUIS) 5 MG TABS tablet Take 1 tablet (5 mg total) by mouth 2 (two) times daily. 60 tablet 2  . diltiazem (CARDIZEM CD) 180 MG 24 hr capsule Take 1 capsule (180 mg total) by mouth 2 (two) times daily. 90 capsule 3  . latanoprost (XALATAN) 0.005 % ophthalmic solution Place 1 drop into both eyes at bedtime.    . sildenafil (VIAGRA) 100 MG  tablet Take 100 mg by mouth daily as needed for erectile dysfunction.     No current facility-administered medications for this encounter.     No Known Allergies  Social History   Social History  . Marital status: Married    Spouse name: N/A  . Number of children: N/A  . Years of education: N/A   Occupational History  . Not on file.   Social History Main Topics  . Smoking status: Never Smoker  . Smokeless tobacco: Never Used  . Alcohol use Yes     Comment: socially  . Drug use: No  . Sexual activity: Not on file   Other Topics Concern  . 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    Family History  Problem Relation Age of Onset  . Stroke Mother   . Alzheimer's disease Mother   . Alzheimer's disease Father     ROS- All systems are reviewed and negative except as per the HPI above  Physical Exam: Vitals:   04/01/16 1529  BP: 130/74  Pulse: 90  Weight: 181 lb 6.4 oz (82.3 kg)  Height: 6\' 2"  (1.88 m)   Wt Readings from Last 3 Encounters:  04/01/16 181 lb 6.4 oz (82.3 kg)  03/29/16 182 lb (82.6 kg)  02/19/16 182 lb 6.4 oz (82.7 kg)    Labs: Lab Results  Component Value Date  NA 142 03/09/2016   K 4.4 03/09/2016   CL 102 03/09/2016   CO2 25 03/09/2016   GLUCOSE 90 03/09/2016   BUN 20 03/09/2016   CREATININE 1.14 03/09/2016   CALCIUM 9.2 03/09/2016   No results found for: INR No results found for: CHOL, HDL, LDLCALC, TRIG   GEN- The patient is well appearing, alert and oriented x 3 today.   Head- normocephalic, atraumatic Eyes-  Sclera clear, conjunctiva pink Ears- hearing intact Oropharynx- clear Neck- supple, no JVP Lymph- no cervical lymphadenopathy Lungs- Clear to ausculation bilaterally, normal work of breathing Heart- irregular rate and rhythm, no murmurs, rubs or gallops, PMI not laterally displaced GI- soft, NT, ND, +  BS Extremities- no clubbing, cyanosis, or  Mild edema around bilateral ankle area MS- no significant deformity or atrophy Skin- no rash or lesion Psych- euthymic mood, full affect Neuro- strength and sensation are intact  EKG-atrial fibrillation at 90 bpm Epic records reviewed Echo pending    Assessment and Plan: 1. Pt appears to have asymptomatic rate controlled persistent afib with failed cardioversion. Antiarrythmic's discussed but pt is not sure that he feels that that step is necessary at this time Will need echo info, if EF is diminished then it may be important to try to restore rhythm Continue apixaban  Continue cardizem at 180 mg bid, but will watch pedal edema and if worse may need to decrease cardizem and add in BB He is off simvastatin due to interaction with cardizem  and has rx to start crestor from PCP Echo tomorrow as scheduled  F/u with Dr. Johney Frame in 2 weeks  Lupita Leash C[facility] [address] [place], [place] [address] [phone]

## 2016-04-05 ENCOUNTER — Telehealth: Payer: Self-pay | Admitting: Internal Medicine

## 2016-04-05 NOTE — Telephone Encounter (Signed)
Follow Up:    Sean Bowman wants to know if you received her fax from 03-31-16 for surgical clearance? She needs this back asap.Wants to know if pt's CV is stable enough for surgery. Please fax [phone]

## 2016-04-05 NOTE — Telephone Encounter (Signed)
MR has form and they are faxing now

## 2016-04-06 NOTE — Telephone Encounter (Signed)
Yes fax was received and Dr Johney Frame said ok to make the change to Crestor.    A message was left for the patient

## 2016-04-06 NOTE — Telephone Encounter (Signed)
Follow Up:; ° ° °Returning your call. °

## 2016-04-08 ENCOUNTER — Encounter: Payer: Self-pay | Admitting: Internal Medicine

## 2016-04-08 NOTE — Telephone Encounter (Signed)
Spoke with patient and let him know Allred's recommendations

## 2016-04-08 NOTE — Telephone Encounter (Signed)
Call Documentation   Tilda Burrow at 04/08/2016 8:29 AM   Status: Signed    F/u Message  Pt states he is return RN call. Please call back

## 2016-04-08 NOTE — Telephone Encounter (Signed)
F/u Message  Pt states he is return RN call. Please call back

## 2016-04-08 NOTE — Telephone Encounter (Signed)
This encounter was created in error - please disregard.

## 2016-04-13 DIAGNOSIS — Z85820 Personal history of malignant melanoma of skin: Secondary | ICD-10-CM | POA: Diagnosis not present

## 2016-04-13 DIAGNOSIS — L905 Scar conditions and fibrosis of skin: Secondary | ICD-10-CM | POA: Diagnosis not present

## 2016-04-15 ENCOUNTER — Ambulatory Visit (HOSPITAL_COMMUNITY)
Admission: RE | Admit: 2016-04-15 | Discharge: 2016-04-15 | Disposition: A | Discharge status: Home / Self Care | Payer: Medicare Other | Source: Ambulatory Visit | Attending: Nurse Practitioner | Admitting: Nurse Practitioner

## 2016-04-15 ENCOUNTER — Encounter (HOSPITAL_COMMUNITY): Payer: Self-pay | Admitting: Nurse Practitioner

## 2016-04-15 VITALS — BP 132/64 | HR 117 | Ht 74.0 in | Wt 180.0 lb

## 2016-04-15 DIAGNOSIS — I4891 Unspecified atrial fibrillation: Secondary | ICD-10-CM | POA: Diagnosis present

## 2016-04-15 DIAGNOSIS — I10 Essential (primary) hypertension: Secondary | ICD-10-CM | POA: Diagnosis not present

## 2016-04-15 DIAGNOSIS — Z79899 Other long term (current) drug therapy: Secondary | ICD-10-CM | POA: Insufficient documentation

## 2016-04-15 DIAGNOSIS — I481 Persistent atrial fibrillation: Secondary | ICD-10-CM | POA: Diagnosis not present

## 2016-04-15 DIAGNOSIS — Z7901 Long term (current) use of anticoagulants: Secondary | ICD-10-CM | POA: Insufficient documentation

## 2016-04-15 DIAGNOSIS — I4819 Other persistent atrial fibrillation: Secondary | ICD-10-CM

## 2016-04-15 MED ORDER — CARVEDILOL 12.5 MG PO TABS: 12.5000 mg | ORAL_TABLET | Freq: Two times a day (BID) | ORAL | 3 refills | Status: DC

## 2016-04-15 NOTE — Patient Instructions (Signed)
Your physician has recommended you make the following change in your medication:  1)Stop Diltiazem  2)Start Coreg 12.5mg  twice a day  Scheduler will be in touch with you regarding follow up appointment with Dr. Johney Frame in 2 weeks

## 2016-04-15 NOTE — Progress Notes (Signed)
Electrophysiology Office Note   Date:  04/15/2016   ID:  Sean Bowman, DOB 1927/10/31, MRN [id number]  PCP:  Minda Meo, MD  Cardiologist:  Dr Allyson Sabal Primary Electrophysiologist: Hillis Range, MD    CC: afib   History of Present Illness: Sean Bowman is a 83 y.o. male who presents today for electrophysiology follow-up.  Cardioversion was recently unsuccessful.  He has only occasional palpitations.  He is quite active for his age.  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  He has noticed swelling on his legs over the past few weeks.  Today, he denies symptoms of chest pain, shortness of breath, orthopnea, PND,  claudication, dizziness, presyncope, syncope, bleeding, or neurologic sequela. The patient is tolerating medications without difficulties and is otherwise without complaint today.    Past Medical History:  Diagnosis Date  . Arthralgia   . Benign positional vertigo   . HTN (hypertension)   . Hyperlipidemia   . Impaired glucose tolerance   . Persistent atrial fibrillation (HCC)   . Senile purpura (HCC)    Past Surgical History:  Procedure Laterality Date  . CARDIOVERSION N/A 03/29/2016   Procedure: CARDIOVERSION;  Surgeon: Thurmon Fair, MD;  [facility];  Service: Cardiovascular;  Laterality: N/A;  . TOE SURGERY       Current Outpatient Prescriptions  Medication Sig Dispense Refill  . apixaban (ELIQUIS) 5 MG TABS tablet Take 1 tablet (5 mg total) by mouth 2 (two) times daily. 60 tablet 2  . rosuvastatin (CRESTOR) 20 MG tablet Take 20 mg by mouth daily.  5  . sildenafil (VIAGRA) 100 MG tablet Take 100 mg by mouth daily as needed for erectile dysfunction.    . carvedilol (COREG) 12.5 MG tablet Take 1 tablet (12.5 mg total) by mouth 2 (two) times daily with a meal. 60 tablet 3   No current facility-administered medications for this encounter.     Allergies:   Patient has no known allergies.   Social History:  The patient  reports that  he has never smoked. He has never used smokeless tobacco. He reports that he drinks alcohol. He reports that he does not use drugs.   Family History:  The patient's  family history includes Alzheimer's disease in his father and mother; Stroke in his mother.    ROS:  Please see the history of present illness.   All other systems are reviewed and negative.    PHYSICAL EXAM: VS:  BP 132/64 (BP Location: Left Arm, Patient Position: Sitting, Cuff Size: Normal)   Pulse (!) 117   Ht 6\' 2"  (1.88 m)   Wt 180 lb (81.6 kg)   BMI 23.11 kg/m  , BMI Body mass index is 23.11 kg/m. GEN: Well nourished, well developed, in no acute distress  HEENT: normal  Neck: no JVD, carotid bruits, or masses Cardiac: tachycardic irregular rhythm ; no murmurs, rubs, or gallops, +1 edema  Respiratory:  clear to auscultation bilaterally, normal work of breathing GI: soft, nontender, nondistended, + BS MS: no deformity or atrophy  Skin: warm and dry  Neuro:  Strength and sensation are intact Psych: euthymic mood, full affect  EKG:  EKG is ordered today. The ekg ordered today shows afib 117 bpm   Wt Readings from Last 3 Encounters:  04/15/16 180 lb (81.6 kg)  04/01/16 181 lb 6.4 oz (82.3 kg)  03/29/16 182 lb (82.6 kg)    ASSESSMENT AND PLAN:  1.  Persistent afib Minimally symptomatic  and determined on routine physical exam. V rates are more elevated.  Echo is reviewed and reveals EF 40% (likely tachy mediated) I suspect that his edema is due to afib/ RVR and diltiazem. I will stop diltiazem and start coreg 12.5mg  BID today I have also discussed tikosyn as an option.  If he decides to proceed, he will contact the AF clinic Otherwise, I will follow-up for rate control in 2 weeks.   chads2vasc score is 79 (age, htn).  Continue eliquis 5mg  BID    2. HTN Stable No change required today    Current medicines are reviewed at length with the patient today.   The patient does not have concerns regarding his  medicines.  The following changes were made today:  none  Labs/ tests ordered today include:  Orders Placed This Encounter  Procedures  . EKG 12-Lead   Today, I have spent 40 minutes with the patient discussing afib management.  More than 50% of the visit time today was spent on this issue.    Randolm Idol, MD  04/15/2016 4:54 PM     [facility] [address] [place] [address] [phone] (office) [phone] (fax)

## 2016-04-23 ENCOUNTER — Telehealth (HOSPITAL_COMMUNITY): Payer: Self-pay | Admitting: *Deleted

## 2016-04-23 MED ORDER — FUROSEMIDE 20 MG PO TABS: 20.0000 mg | ORAL_TABLET | Freq: Every day | ORAL | 1 refills | Status: DC

## 2016-04-23 NOTE — Telephone Encounter (Signed)
Patient called in stating he is having swelling in lower legs despite stopping diltiazem and continues to be short of breath with exertion. Discussed with [name] NP == will call in Lasix 20mg  once a day until he sees Dr. Johney Frame on 2/23. Pt will call if issues in the interim.

## 2016-04-30 ENCOUNTER — Encounter: Payer: Self-pay | Admitting: Internal Medicine

## 2016-04-30 ENCOUNTER — Ambulatory Visit (INDEPENDENT_AMBULATORY_CARE_PROVIDER_SITE_OTHER): Payer: Medicare Other | Admitting: Internal Medicine

## 2016-04-30 ENCOUNTER — Other Ambulatory Visit: Payer: Self-pay

## 2016-04-30 VITALS — BP 110/72 | HR 114 | Ht 74.0 in | Wt 179.6 lb

## 2016-04-30 DIAGNOSIS — I481 Persistent atrial fibrillation: Secondary | ICD-10-CM

## 2016-04-30 DIAGNOSIS — I519 Heart disease, unspecified: Secondary | ICD-10-CM | POA: Diagnosis not present

## 2016-04-30 DIAGNOSIS — I10 Essential (primary) hypertension: Secondary | ICD-10-CM

## 2016-04-30 DIAGNOSIS — I4819 Other persistent atrial fibrillation: Secondary | ICD-10-CM

## 2016-04-30 NOTE — Patient Instructions (Addendum)
Medication Instructions:  Your physician recommends that you continue on your current medications as directed. Please refer to the Current Medication list given to you today.   Labwork: None ordered   Testing/Procedures: Tikosyn Load on Tues with Dr Johney Frame  Follow-Up: Your physician recommends that you schedule a follow-up appointment ---will be scheduled after Tikosyn Load    Call Tresa Endo on Mon or fax to me your decision

## 2016-04-30 NOTE — Progress Notes (Signed)
Electrophysiology Office Note   Date:  04/30/2016   ID:  Sean Bowman, DOB 09-18-1927, MRN [id number]  PCP:  Minda Meo, MD  Cardiologist:  Dr Allyson Sabal Primary Electrophysiologist: Hillis Range, MD    CC: afib   History of Present Illness: Sean Bowman is a 83 y.o. male who presents today for electrophysiology follow-up.  Cardioversion was recently unsuccessful.  His edema and SOB are worse.  He has started lasix. Today, he denies symptoms of chest pain, , orthopnea, PND,  claudication, dizziness, presyncope, syncope, bleeding, or neurologic sequela. The patient is tolerating medications without difficulties and is otherwise without complaint today.    Past Medical History:  Diagnosis Date  . Arthralgia   . Benign positional vertigo   . HTN (hypertension)   . Hyperlipidemia   . Impaired glucose tolerance   . Persistent atrial fibrillation (HCC)   . Senile purpura (HCC)    Past Surgical History:  Procedure Laterality Date  . CARDIOVERSION N/A 03/29/2016   Procedure: CARDIOVERSION;  Surgeon: Thurmon Fair, MD;  [facility]: [facility];  Service: Cardiovascular;  Laterality: N/A;  . TOE SURGERY       Current Outpatient Prescriptions  Medication Sig Dispense Refill  . apixaban (ELIQUIS) 5 MG TABS tablet Take 1 tablet (5 mg total) by mouth 2 (two) times daily. 60 tablet 2  . carvedilol (COREG) 12.5 MG tablet Take 1 tablet (12.5 mg total) by mouth 2 (two) times daily with a meal. 60 tablet 3  . furosemide (LASIX) 20 MG tablet Take 1 tablet (20 mg total) by mouth daily. 30 tablet 1  . rosuvastatin (CRESTOR) 20 MG tablet Take 20 mg by mouth daily.  5   No current facility-administered medications for this visit.     Allergies:   Patient has no known allergies.   Social History:  The patient  reports that he has never smoked. He has never used smokeless tobacco. He reports that he drinks alcohol. He reports that he does not use drugs.   Family History:  The patient's   family history includes Alzheimer's disease in his father and mother; Stroke in his mother.    ROS:  Please see the history of present illness.   All other systems are reviewed and negative.    PHYSICAL EXAM: VS:  BP 110/72   Pulse (!) 114   Ht 6\' 2"  (1.88 m)   Wt 179 lb 9.6 oz (81.5 kg)   BMI 23.06 kg/m  , BMI Body mass index is 23.06 kg/m. GEN: Well nourished, well developed, in no acute distress  HEENT: normal  Neck: no JVD, carotid bruits, or masses Cardiac: tachycardic irregular rhythm ; no murmurs, rubs, or gallops, +1 edema  Respiratory:  clear to auscultation bilaterally, normal work of breathing GI: soft, nontender, nondistended, + BS MS: no deformity or atrophy  Skin: warm and dry  Neuro:  Strength and sensation are intact Psych: euthymic mood, full affect  EKG:  EKG is ordered today. The ekg ordered today shows afib 114 bpm  Personally reviewed   Wt Readings from Last 3 Encounters:  04/30/16 179 lb 9.6 oz (81.5 kg)  04/15/16 180 lb (81.6 kg)  04/01/16 181 lb 6.4 oz (82.3 kg)    ASSESSMENT AND PLAN:  1.  Persistent afib V rates are remain elevated.  Echo is reviewed and reveals EF 40% (likely tachy mediated) I suspect that his edema is due to afib/ RVR.  chads2vasc score is 87 (age, htn).  Continue eliquis  5mg  BID  I have advised tikosyn.  He is contemplating this option.  He will contact my office when he wishes to proceed.   2. HTN Stable No change required today  Current medicines are reviewed at length with the patient today.   The patient does not have concerns regarding his medicines.  The following changes were made today:  none    Signed, Hillis Range, MD  04/30/2016 4:27 PM     [facility] [address] [place] [address] [phone] (office) [phone] (fax)

## 2016-05-03 ENCOUNTER — Telehealth: Payer: Self-pay | Admitting: Internal Medicine

## 2016-05-03 NOTE — Telephone Encounter (Signed)
New Message     Please call her husband wants to go on vacation after the procedure 05/04/16 is this a good idea for him to fly?  Please call before 1130a

## 2016-05-03 NOTE — Telephone Encounter (Signed)
Discussed with Dr Johney Frame and will be okay to travel after Tikosyn load.  Wife aware

## 2016-05-04 ENCOUNTER — Inpatient Hospital Stay (HOSPITAL_COMMUNITY)
Admission: AD | Admit: 2016-05-04 | Discharge: 2016-05-07 | DRG: 310 | Disposition: A | Discharge status: Home / Self Care | Payer: Medicare Other | Source: Ambulatory Visit | Attending: Cardiovascular Disease | Admitting: Cardiovascular Disease

## 2016-05-04 ENCOUNTER — Ambulatory Visit (INDEPENDENT_AMBULATORY_CARE_PROVIDER_SITE_OTHER): Payer: Medicare Other | Admitting: Pharmacist

## 2016-05-04 ENCOUNTER — Encounter (HOSPITAL_COMMUNITY): Payer: Self-pay | Admitting: Physician Assistant

## 2016-05-04 DIAGNOSIS — I4891 Unspecified atrial fibrillation: Secondary | ICD-10-CM

## 2016-05-04 DIAGNOSIS — I481 Persistent atrial fibrillation: Principal | ICD-10-CM

## 2016-05-04 DIAGNOSIS — E785 Hyperlipidemia, unspecified: Secondary | ICD-10-CM | POA: Diagnosis present

## 2016-05-04 DIAGNOSIS — I10 Essential (primary) hypertension: Secondary | ICD-10-CM | POA: Diagnosis present

## 2016-05-04 DIAGNOSIS — I429 Cardiomyopathy, unspecified: Secondary | ICD-10-CM | POA: Diagnosis present

## 2016-05-04 DIAGNOSIS — R000 Tachycardia, unspecified: Secondary | ICD-10-CM | POA: Diagnosis present

## 2016-05-04 DIAGNOSIS — Z79899 Other long term (current) drug therapy: Secondary | ICD-10-CM

## 2016-05-04 DIAGNOSIS — Z7901 Long term (current) use of anticoagulants: Secondary | ICD-10-CM

## 2016-05-04 DIAGNOSIS — I4819 Other persistent atrial fibrillation: Secondary | ICD-10-CM

## 2016-05-04 LAB — BASIC METABOLIC PANEL
Anion gap: 8 (ref 5–15)
BUN / CREAT RATIO: 20 (ref 10–24)
BUN: 25 mg/dL (ref 8–27)
BUN: 20 mg/dL (ref 6–20)
CHLORIDE: 104 mmol/L (ref 101–111)
CO2: 31 mmol/L — ABNORMAL HIGH (ref 18–29)
CO2: 28 mmol/L (ref 22–32)
CREATININE: 1.24 mg/dL (ref 0.61–1.24)
Calcium: 9.1 mg/dL (ref 8.6–10.2)
Calcium: 9.1 mg/dL (ref 8.9–10.3)
Chloride: 104 mmol/L (ref 96–106)
Creatinine, Ser: 1.24 mg/dL (ref 0.76–1.27)
GFR calc non Af Amer: 51 mL/min — ABNORMAL LOW (ref >60)
GFR, EST AFRICAN AMERICAN: 60 mL/min/{1.73_m2} (ref >59)
GFR, EST AFRICAN AMERICAN: 59 mL/min — AB (ref >60)
GFR, EST NON AFRICAN AMERICAN: 52 mL/min/{1.73_m2} — AB (ref >59)
Glucose, Bld: 115 mg/dL — ABNORMAL HIGH (ref 65–99)
Glucose: 136 mg/dL — ABNORMAL HIGH (ref 65–99)
POTASSIUM: 4.6 mmol/L (ref 3.5–5.1)
Potassium: 4.2 mmol/L (ref 3.5–5.2)
SODIUM: 142 mmol/L (ref 134–144)
Sodium: 140 mmol/L (ref 135–145)

## 2016-05-04 LAB — MAGNESIUM
Magnesium: 2.3 mg/dL (ref 1.6–2.3)
Magnesium: 2.3 mg/dL (ref 1.7–2.4)

## 2016-05-04 MED ORDER — APIXABAN 5 MG PO TABS
5.0000 mg | ORAL_TABLET | Freq: Two times a day (BID) | ORAL | Status: DC
  Administered 2016-05-04 – 2016-05-07 (×6): 5 mg via ORAL
  Filled 2016-05-04 (×6): qty 1

## 2016-05-04 MED ORDER — METOPROLOL TARTRATE 5 MG/5ML IV SOLN
INTRAVENOUS | Status: AC
  Filled 2016-05-04: qty 5

## 2016-05-04 MED ORDER — SODIUM CHLORIDE 0.9% FLUSH
3.0000 mL | Freq: Two times a day (BID) | INTRAVENOUS | Status: DC
  Administered 2016-05-05 – 2016-05-06 (×2): 3 mL via INTRAVENOUS

## 2016-05-04 MED ORDER — ROSUVASTATIN CALCIUM 10 MG PO TABS
20.0000 mg | ORAL_TABLET | Freq: Every day | ORAL | Status: DC
  Administered 2016-05-05 – 2016-05-07 (×3): 20 mg via ORAL
  Filled 2016-05-04 (×3): qty 2

## 2016-05-04 MED ORDER — SODIUM CHLORIDE 0.9% FLUSH
3.0000 mL | INTRAVENOUS | Status: DC | PRN
  Administered 2016-05-06: 3 mL via INTRAVENOUS
  Filled 2016-05-04: qty 3

## 2016-05-04 MED ORDER — FUROSEMIDE 20 MG PO TABS
20.0000 mg | ORAL_TABLET | Freq: Every day | ORAL | Status: DC
  Administered 2016-05-05 – 2016-05-07 (×2): 20 mg via ORAL
  Filled 2016-05-04 (×3): qty 1

## 2016-05-04 MED ORDER — METOPROLOL TARTRATE 5 MG/5ML IV SOLN
10.0000 mg | Freq: Once | INTRAVENOUS | Status: AC
  Administered 2016-05-04: 10 mg via INTRAVENOUS
  Filled 2016-05-04: qty 10

## 2016-05-04 MED ORDER — SODIUM CHLORIDE 0.9 % IV SOLN: 250.0000 mL | INTRAVENOUS | Status: DC | PRN

## 2016-05-04 MED ORDER — CARVEDILOL 12.5 MG PO TABS
12.5000 mg | ORAL_TABLET | Freq: Two times a day (BID) | ORAL | Status: DC
  Administered 2016-05-05 – 2016-05-07 (×5): 12.5 mg via ORAL
  Filled 2016-05-04 (×5): qty 1

## 2016-05-04 MED ORDER — DOFETILIDE 250 MCG PO CAPS
250.0000 ug | ORAL_CAPSULE | Freq: Two times a day (BID) | ORAL | Status: DC
  Administered 2016-05-04 – 2016-05-07 (×6): 250 ug via ORAL
  Filled 2016-05-04 (×6): qty 1

## 2016-05-04 NOTE — Progress Notes (Signed)
Patient ID: Sean Bowman                 DOB: May 12, 1927                    MRN: [id number]     HPI: Sean Bowman is a 83 y.o. male patient of Dr. Johney Frame who presents today for Tikosyn initiation. PMH is significant for permanent afib, HTN, and HLD. Recent cardioversion on 03/29/16 was unsuccessful and pt with complaints of worsening edema and SOB. Decision was made at last office visit on 04/30/16 to pursue Tikosyn initiation.  Pt educated on potential risks with Tikosyn including QTc prolongation. Pt is aware of the importance of not missing any doses and will call the office if they miss more than 2 doses in a row. Pt is anticoagulated with Eliquis and reports no missed doses within the past month. Pt is currently not taking any QTc prolongating or contraindicated medications.   EKG: Read by Dr Elberta Fortis. Afib with RVR. Vent rate 109bpm, QTc (was 457 at OV on 2/23 when Dr Johney Frame recommended Tikosyn)  Labs: K 4.2, Mg 2.3, SCr 1.24, CrCL 47mL/min, Wt 180 lbs  Past Medical History:  Diagnosis Date  . Arthralgia   . Benign positional vertigo   . HTN (hypertension)   . Hyperlipidemia   . Impaired glucose tolerance   . Persistent atrial fibrillation (HCC)   . Senile purpura (HCC)     Current Outpatient Prescriptions on File Prior to Visit  Medication Sig Dispense Refill  . apixaban (ELIQUIS) 5 MG TABS tablet Take 1 tablet (5 mg total) by mouth 2 (two) times daily. 60 tablet 2  . carvedilol (COREG) 12.5 MG tablet Take 1 tablet (12.5 mg total) by mouth 2 (two) times daily with a meal. 60 tablet 3  . furosemide (LASIX) 20 MG tablet Take 1 tablet (20 mg total) by mouth daily. 30 tablet 1  . rosuvastatin (CRESTOR) 20 MG tablet Take 20 mg by mouth daily.  5   No current facility-administered medications on file prior to visit.     No Known Allergies  Assessment/Plan:  1. Atrial fibrillation - QTc elevated > however discussed with Dr Johney Frame and still ok for Tikosyn admit.  Mg and K both at goal. CrCl 73mL/min, anticipated starting dose of Tikosyn is BID. Pt is aware to report to admitting. Of note, pt will need to be discharged on generic Tikosyn (Tier 4 with his insurance - brand is not covered at all).   Glenyce Randle E. Kaniya Trueheart, PharmD, CPP, BCACP [facility] [address] [place], [place] [address] Phone: [phone]; Fax: [phone] 05/04/2016 10:36 AM

## 2016-05-04 NOTE — Progress Notes (Signed)
Pharmacy Review for Dofetilide (Tikosyn) Initiation  Admit Complaint: 83 y.o. male admitted 05/04/2016 with atrial fibrillation to be initiated on dofetilide.   Assessment:  Patient Exclusion Criteria: If any screening criteria checked as "Yes", then  patient  should NOT receive dofetilide until criteria item is corrected. If "Yes" please indicate correction plan.  YES  NO Patient  Exclusion Criteria Correction Plan  [x]  []  Baseline QTc interval is greater than or equal to 440 msec. IF above YES box checked dofetilide contraindicated unless patient has ICD; then may proceed if QTc 500-550 msec or with known ventricular conduction abnormalities may proceed with QTc 550-600 msec. QTc =490     Noted per Dr. Tresa Endo EKG: Read by Dr Elberta Fortis. Afib with RVR. Vent rate 109bpm, QTc (was 457 at OV on 2/23 when Dr Johney Frame recommended Tikosyn)   []  [x]  Magnesium level is less than 1.8 mEq/l : Last magnesium:  Lab Results  Component Value Date   MG 2.3 05/04/2016         []  [x]  Potassium level is less than 4 mEq/l : Last potassium:  Lab Results  Component Value Date   K 4.2 05/04/2016         []  [x]  Patient is known or suspected to have a digoxin level greater than 2 ng/ml: No results found for: DIGOXIN    []  [x]  Creatinine clearance less than 20 ml/min (calculated using Cockcroft-Gault, actual body weight and serum creatinine): Estimated Creatinine Clearance: 48.1 mL/min (by C-G formula based on SCr of 1.24 mg/dL).    []  [x]  Patient has received drugs known to prolong the QT intervals within the last 48 hours (phenothiazines, tricyclics or tetracyclic antidepressants, erythromycin, H-1 antihistamines, cisapride, fluoroquinolones, azithromycin). Drugs not listed above may have an, as yet, undetected potential to prolong the QT interval, updated information on QT prolonging agents is available at this website:QT prolonging agents   []  [x]  Patient received a dose of hydrochlorothiazide  (Oretic) alone or in any combination including triamterene (Dyazide, Maxzide) in the last 48 hours.   []  [x]  Patient received a medication known to increase dofetilide plasma concentrations prior to initial dofetilide dose:  . Trimethoprim (Primsol, Proloprim) in the last 36 hours . Verapamil (Calan, Verelan) in the last 36 hours or a sustained release dose in the last 72 hours . Megestrol (Megace) in the last 5 days  . Cimetidine (Tagamet) in the last 6 hours . Ketoconazole (Nizoral) in the last 24 hours . Itraconazole (Sporanox) in the last 48 hours  . Prochlorperazine (Compazine) in the last 36 hours    []  [x]  Patient is known to have a history of torsades de pointes; congenital or acquired long QT syndromes.   []  [x]  Patient has received a Class 1 antiarrhythmic with less than 2 half-lives since last dose. (Disopyramide, Quinidine, Procainamide, Lidocaine, Mexiletine, Flecainide, Propafenone)   []  [x]  Patient has received amiodarone therapy in the past 3 months or amiodarone level is greater than 0.3 ng/ml.    Patient has been appropriately anticoagulated with Eliquis.   Ordering provider was confirmed at [email or web address] if they are not listed on the [facility] Authorized Prescribers list.  Goal of Therapy: Follow renal function, electrolytes, potential drug interactions, and dose adjustment. Provide education and 1 week supply at discharge.  Plan:  [x]   Physician selected initial dose within range recommended for patients level of renal function - will monitor for response.  []   Physician selected initial dose outside of range recommended for patients level of  renal function - will discuss if the dose should be altered at this time.   Select One Calculated CrCl  Dose q12h  []  > 60 ml/min 500 mcg  [x]  40-60 ml/min 250 mcg  []  20-40 ml/min 125 mcg   2. Follow up QTc after the first 5 doses, renal function, electrolytes (K & Mg) daily x 3     days, dose adjustment, success of  initiation and facilitate 1 week discharge supply as     clinically indicated.  3. Initiate Tikosyn education video (Call [phone] and ask for Tikosyn Video # 116).  4. Place Enrollment Form on the chart for discharge supply of dofetilide.  Noah Delaine, RPh Clinical Pharmacist 7:02 PM 05/04/2016

## 2016-05-04 NOTE — Progress Notes (Signed)
Pt has arrived to 2w as a direct admit. Telemetry box applied and CCMD notified. Pt oriented to room. MD paged. Awaiting orders to be placed.   Berdine Dance BSN, RN

## 2016-05-04 NOTE — H&P (Signed)
Patient ID: Sean Bowman MRN: [id number], DOB/AGE: [birth year]   Admit date: 05/04/2016   Primary Physician: Minda Meo, MD Primary Cardiologist: Dr. Johney Frame  Reason for admission: Tikosyn admission  HPI:  Sean Bowman is a 83 y.o. male with a history of permanent afib s/p recent unsuccessful DCCV on 03/29/16, HTN, HLD who presented to [facility] today for planned [facility] load.  Decision was made at last office visit on 04/30/16 to pursue Tikosyn initiation.  He was seen in the office today by our clinical pharmacist, Margaretmary Dys PharmD. Pt educated on potential risks with Tikosyn including QTc prolongation. Pt is aware of the importance of not missing any doses and will call the office if they miss more than 2 doses in a row. Pt is anticoagulated with Eliquisand reports no missed doses within the past month. Pt is currently not taking any QTc prolongating or contraindicated medications.   EKG: Read by Dr Elberta Fortis. Afib with RVR. Vent rate 109bpm, QTc (was 457 at OV on 2/23 when Dr Johney Frame recommended Tikosyn)  Labs: K 4.2, Mg 2.3, SCr 1.24, CrCL 8mL/min, Wt 180 lbs    Problem List  Past Medical History:  Diagnosis Date  . Arthralgia   . Benign positional vertigo   . HTN (hypertension)   . Hyperlipidemia   . Impaired glucose tolerance   . Persistent atrial fibrillation (HCC)   . Senile purpura (HCC)     Past Surgical History:  Procedure Laterality Date  . CARDIOVERSION N/A 03/29/2016   Procedure: CARDIOVERSION;  Surgeon: Thurmon Fair, MD;  Location: [facility];  Service: Cardiovascular;  Laterality: N/A;  . TOE SURGERY       Allergies  No Known Allergies   Home Medications  Prior to Admission medications   Medication Sig Start Date End Date Taking? Authorizing Provider  apixaban (ELIQUIS) 5 MG TABS tablet Take 1 tablet (5 mg total) by mouth 2 (two) times daily. 03/11/16   Hillis Range, MD  carvedilol (COREG) 12.5 MG tablet Take 1 tablet (12.5  mg total) by mouth 2 (two) times daily with a meal. 04/15/16   Hillis Range, MD  furosemide (LASIX) 20 MG tablet Take 1 tablet (20 mg total) by mouth daily. 04/23/16   Newman Nip, NP  rosuvastatin (CRESTOR) 20 MG tablet Take 20 mg by mouth daily. 03/29/16   Historical Provider, MD    Family History  Family History  Problem Relation Age of Onset  . Stroke Mother   . Alzheimer's disease Mother   . Alzheimer's disease Father       Family Status  Relation Status  . Mother Deceased  . Father Deceased  . Maternal Grandmother Deceased  . Maternal Grandfather Deceased  . Paternal Grandmother Deceased  . Paternal Grandfather Deceased     Social History  Social History   Social History  . Marital status: Married    Spouse name: N/A  . Number of children: N/A  . Years of education: N/A   Occupational History  . Not on file.   Social History Main Topics  . Smoking status: Never Smoker  . Smokeless tobacco: Never Used  . Alcohol use Yes     Comment: socially  . Drug use: No  . Sexual activity: Not on file   Other Topics Concern  . 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  Review of Systems General:  No chills, fever, night sweats or weight changes.  Cardiovascular:  No chest pain, dyspnea on exertion, edema, orthopnea, palpitations, paroxysmal nocturnal dyspnea. Dermatological: No rash, lesions/masses Respiratory: No cough, dyspnea Urologic: No hematuria, dysuria Abdominal:   No nausea, vomiting, diarrhea, bright red blood per rectum, melena, or hematemesis Neurologic:  No visual changes, wkns, changes in mental status. All other systems reviewed and are otherwise negative except as noted above.  Physical Exam  Blood pressure 118/79, pulse (!) 139, resp. rate 18, SpO2 100 %.  General: Pleasant, NAD Psych: Normal affect. Neuro: Alert and  oriented X 3. Moves all extremities spontaneously. HEENT: Normal  Neck: Supple without bruits or JVD. Lungs:  Resp regular and unlabored, CTA. Heart: irreg irreg, tachy. no s3, s4, or murmurs. Abdomen: Soft, non-tender, non-distended, BS + x 4.  Extremities: No clubbing, cyanosis or edema. DP/PT/Radials 2+ and equal bilaterally.  Labs  No results for input(s): CKTOTAL, CKMB, TROPONINI in the last 72 hours. Lab Results  Component Value Date   WBC 9.8 03/09/2016   HCT 41.2 03/09/2016   MCV 94 03/09/2016   PLT 221 03/09/2016     Recent Labs Lab 05/04/16 0818  NA 142  K 4.2  CL 104  CO2 31*  BUN 25  CREATININE 1.24  CALCIUM 9.1  GLUCOSE 136*   No results found for: CHOL, HDL, LDLCALC, TRIG No results found for: DDIMER   Radiology/Studies  No results found.  ECG  Afib with RVR. Vent rate 109bpm, QTc (was 457 at OV on 2/23 when Dr Johney Frame recommended Tikosyn)  ASSESSMENT AND PLAN  1. Atrial fibrillation - QTc elevated > however discussed with Dr Johney Frame and still ok for Tikosyn admit. Mg and K both at goal. CrCl 82mL/min, anticipated starting dose of Tikosyn is BID. Pt is aware to report to admitting. Of note, pt will need to be discharged on generic Tikosyn (Tier 4 with his insurance - brand is not covered at all).   Gaetana Michaelis, PA-C 05/04/2016, 6:24 PM  Pager [phone]   Patient seen and examined. Agree with assessment and plan. Very pleasant 88 year WM originally from [place], [place] who has AF on eliquis anticoagulation and recently failed cardioversion . He had seen Dr. Johney Frame and is now admitted for tikosyn loading.  ECG earlier today showed AF with HR 109; QT 350/QTc 471 msec.  He has Cr Cl 49 with 1.24. He is tolerating eliquis 5 mg bid.  Will initiate tonight; monitor QTc; Mg. ECG in am. On exam, BP stable; P 100, irreg, irreg, 1/6 systolic murmur, trace left ankle edema. A repeat ECG upon arrival now shows AF 145, QT 316,  QTc 490 Continue carvedilol 12.5 mg bid and give diose now.    Lennette Bihari, MD, [facility] 05/04/2016 6:24 PM

## 2016-05-05 ENCOUNTER — Other Ambulatory Visit: Payer: Self-pay

## 2016-05-05 LAB — BASIC METABOLIC PANEL
ANION GAP: 7 (ref 5–15)
BUN: 24 mg/dL — ABNORMAL HIGH (ref 6–20)
CALCIUM: 8.6 mg/dL — AB (ref 8.9–10.3)
CO2: 28 mmol/L (ref 22–32)
CREATININE: 1.36 mg/dL — AB (ref 0.61–1.24)
Chloride: 106 mmol/L (ref 101–111)
GFR, EST AFRICAN AMERICAN: 53 mL/min — AB (ref >60)
GFR, EST NON AFRICAN AMERICAN: 45 mL/min — AB (ref >60)
Glucose, Bld: 99 mg/dL (ref 65–99)
Potassium: 4.3 mmol/L (ref 3.5–5.1)
SODIUM: 141 mmol/L (ref 135–145)

## 2016-05-05 LAB — MAGNESIUM: MAGNESIUM: 2.3 mg/dL (ref 1.7–2.4)

## 2016-05-05 NOTE — Progress Notes (Signed)
Per Theatre stage manager for [employer], @ [employer] # [phone] OPT- 1   1. TIKOSYN 250 MCG BID  COVER- NO  NONE FORMULARY  PRIOR APPROVAL- YES # [phone] OPT 5 FOR EXCEPTION   2. DOFETILIDE 250.00 BID  COVER- YES  CIO-PAY- $ 37.00  TIER- 4 DRUG  PRIOR APPROVAL- NO   PREFERRED PHARMACY : [facility] AND [facility]

## 2016-05-05 NOTE — Progress Notes (Signed)
    SUBJECTIVE: The patient is doing well today.  At this time, he denies chest pain, shortness of breath, or any new concerns.  CURRENT MEDICATIONS: . apixaban  5 mg Oral BID  . carvedilol  12.5 mg Oral BID WC  . dofetilide  250 mcg Oral BID  . furosemide  20 mg Oral Daily  . metoprolol      . rosuvastatin  20 mg Oral Daily  . sodium chloride flush  3 mL Intravenous Q12H     OBJECTIVE: Physical Exam: Vitals:   05/04/16 1727 05/04/16 2121 05/05/16 0521  BP: 118/79 118/73 106/75  Pulse: (!) 139 (!) 103 (!) 130  Resp: 18 18 18   Temp:  98 F (36.7 C) 98.3 F (36.8 C)  TempSrc: Oral Oral Oral  SpO2: 100% 100% 99%  Weight: 175 lb 3.2 oz (79.5 kg)    Height: 6\' 2"  (1.88 m)     No intake or output data in the 24 hours ending 05/05/16 0650  Telemetry reveals atrial fibrillation  GEN- The patient is well appearing, alert and oriented x 3 today.   Head- normocephalic, atraumatic Eyes-  Sclera clear, conjunctiva pink Ears- hearing intact Oropharynx- clear Neck- supple Lungs- Clear to ausculation bilaterally, normal work of breathing Heart- Tachycardic irregular rate and rhythm GI- soft, NT, ND, + BS Extremities- no clubbing, cyanosis, or edema Skin- no rash or lesion Psych- euthymic mood, full affect Neuro- strength and sensation are intact  LABS: Basic Metabolic Panel:  Recent Labs  44/96/75 1913 05/05/16 0310  NA 140 141  K 4.6 4.3  CL 104 106  CO2 28 28  GLUCOSE 115* 99  BUN 20 24*  CREATININE 1.24 1.36*  CALCIUM 9.1 8.6*  MG 2.3 2.3    ASSESSMENT AND PLAN:  Principal Problem:   Atrial fibrillation, persistent (HCC) Active Problems:   Hyperlipidemia   Essential hypertension  1.  Persistent atrial fibrillation Admitted for Tikosyn load BMET, Mg, QTc stable Continue Eliquis long term for CHADS2VASC of 3 If still in AF tomorrow, will need DCCV Ok to shower while here  2.  HTN Stable No change required today  Hillis Range, MD 05/05/2016 8:15  AM

## 2016-05-05 NOTE — Care Management Note (Signed)
Case Management Note Donn Pierini RN, BSN Unit 2W-Case Manager [phone]  Patient Details  Name: Sean Bowman MRN: [id number] Date of Birth: Aug 08, 1927  Subjective/Objective:   Pt admitted with afib for Tikosyn load                 Action/Plan: PTA pt lived at home, independent- referral received for Tikosyn needs- per insurance check- copay cost for generic is $37/mo- spoke with pt at bedside- coverage info shared-  Pt will need 7 day supply on discharge from [facility]- MD please provide script fo r7 day with no refills to send to [facility]- pt will also need script with [facility] for [facility] in [facility]-  Expected Discharge Date:    05/07/16              Expected Discharge Plan:  Home/Self Care  In-House Referral:     Discharge planning Services  Medication Assistance  Post Acute Care Choice:  NA Choice offered to:  NA  DME Arranged:    DME Agency:     HH Arranged:    HH Agency:     Status of Service:  Completed, signed off  If discussed at [employer] of Stay Meetings, dates discussed:    Additional Comments:  Darrold Span, RN 05/05/2016, 11:43 AM

## 2016-05-06 ENCOUNTER — Encounter (HOSPITAL_COMMUNITY)
Admission: AD | Disposition: A | Discharge status: Home / Self Care | Payer: Self-pay | Source: Ambulatory Visit | Attending: Cardiovascular Disease

## 2016-05-06 LAB — BASIC METABOLIC PANEL
Anion gap: 6 (ref 5–15)
BUN: 21 mg/dL — ABNORMAL HIGH (ref 6–20)
CALCIUM: 8.4 mg/dL — AB (ref 8.9–10.3)
CO2: 27 mmol/L (ref 22–32)
CREATININE: 1.19 mg/dL (ref 0.61–1.24)
Chloride: 108 mmol/L (ref 101–111)
GFR calc non Af Amer: 53 mL/min — ABNORMAL LOW (ref >60)
GLUCOSE: 96 mg/dL (ref 65–99)
Potassium: 3.9 mmol/L (ref 3.5–5.1)
Sodium: 141 mmol/L (ref 135–145)

## 2016-05-06 LAB — MAGNESIUM: Magnesium: 2.3 mg/dL (ref 1.7–2.4)

## 2016-05-06 SURGERY — CARDIOVERSION
Anesthesia: Monitor Anesthesia Care

## 2016-05-06 SURGERY — Surgical Case
Anesthesia: *Unknown

## 2016-05-06 MED ORDER — POTASSIUM CHLORIDE CRYS ER 10 MEQ PO TBCR
10.0000 meq | EXTENDED_RELEASE_TABLET | Freq: Every day | ORAL | Status: DC
  Administered 2016-05-06 – 2016-05-07 (×2): 10 meq via ORAL
  Filled 2016-05-06 (×4): qty 1

## 2016-05-06 NOTE — Discharge Summary (Signed)
ELECTROPHYSIOLOGY PROCEDURE DISCHARGE SUMMARY    Patient ID: Sean Bowman,  MRN: [id number], DOB/AGE: 06/06/1927 83 y.o.  Admit date: 05/04/2016 Discharge date: 05/07/2016  Primary Care Physician: Minda Meo, MD Primary Cardiologist: Allyson Sabal Electrophysiologist: Ahman Dugdale  Primary Discharge Diagnosis:  1.  Persistent atrial fibrillation status post Tikosyn loading this admission      CHA2DS2Vasc is at least 3, on Eliquis  Secondary Discharge Diagnosis:  1.  HTN 2.  Hyperlipidemia 3.  Tachycardia mediated cardiomyopathy   No Known Allergies   Procedures This Admission:  1.  Tikosyn loading  Brief HPI: [name] is a 83 y.o. male with a past medical history as noted above. He has developed a tachycardia induced cardiomyopathy with persistent atrial fibrillation. Risks, benefits, and alternatives to Tikosyn were reviewed with the patient who wished to proceed.    Hospital Course:  The patient was admitted and Tikosyn was initiated.  Renal function and electrolytes were followed during the hospitalization.  His QTc remained stable, he was monitored until discharge on telemetry which was reviewed by Dr. Johney Frame and myself, demonstrated SR.  On the day of discharge, he was examined by Dr Johney Frame who considered him stable for discharge to home. Will continue low dose Kdur given his lasix and need for replacement here.  Follow-up has been arranged with AF clinic in 1 week for labs, EKG and a visit and with Dr Johney Frame in 4 weeks.   Physical Exam: Vitals:   05/06/16 0800 05/06/16 1333 05/06/16 2101 05/07/16 0624  BP: 118/63 120/74 113/69 117/65  Pulse: 67 (!) 58 74 72  Resp:  18 18 18   Temp:  98.3 F (36.8 C) 97.4 F (36.3 C) 98.3 F (36.8 C)  TempSrc:  Oral Axillary Oral  SpO2:  100% 100% 94%  Weight:      Height:        Labs:   Lab Results  Component Value Date   WBC 9.8 03/09/2016   HCT 41.2 03/09/2016   MCV 94 03/09/2016   PLT 221 03/09/2016     Recent  Labs Lab 05/07/16 0310  NA 140  K 4.2  CL 106  CO2 26  BUN 15  CREATININE 1.07  CALCIUM 8.7*  GLUCOSE 97     Discharge Medications:  Allergies as of 05/07/2016   No Known Allergies     Medication List    TAKE these medications   apixaban 5 MG Tabs tablet Commonly known as:  ELIQUIS Take 1 tablet (5 mg total) by mouth 2 (two) times daily.   carvedilol 12.5 MG tablet Commonly known as:  COREG Take 1 tablet (12.5 mg total) by mouth 2 (two) times daily with a meal.   dofetilide 250 MCG capsule Commonly known as:  TIKOSYN Take 1 capsule (250 mcg total) by mouth 2 (two) times daily.   furosemide 20 MG tablet Commonly known as:  LASIX Take 1 tablet (20 mg total) by mouth daily.   potassium chloride 10 MEQ tablet Commonly known as:  K-DUR,KLOR-CON Take 1 tablet (10 mEq total) by mouth daily. Start taking on:  05/08/2016   rosuvastatin 20 MG tablet Commonly known as:  CRESTOR Take 20 mg by mouth daily.       Disposition:  Discharge Instructions    Diet - low sodium heart healthy    Complete by:  As directed    Increase activity slowly    Complete by:  As directed      Follow-up Information  [facility] Follow up on 05/14/2016.   Specialty:  Cardiology Why:  at [facility] information: [address] [id number] Wilhemina Bonito [place] [address] [phone]       Hillis Range, MD Follow up on 06/16/2016.   Specialty:  Cardiology Why:  at 12:15PM  Contact information: [address] [place] [address] [phone]           Duration of Discharge Encounter: Greater than 30 minutes including physician time.  Signed, Gypsy Balsam, NP 05/07/2016 12:52 PM  Hillis Range MD, [facility]

## 2016-05-06 NOTE — Progress Notes (Signed)
    SUBJECTIVE: The patient is doing well today.  At this time, he denies chest pain, shortness of breath, or any new concerns.  CURRENT MEDICATIONS: . apixaban  5 mg Oral BID  . carvedilol  12.5 mg Oral BID WC  . dofetilide  250 mcg Oral BID  . furosemide  20 mg Oral Daily  . rosuvastatin  20 mg Oral Daily  . sodium chloride flush  3 mL Intravenous Q12H     OBJECTIVE: Physical Exam: Vitals:   05/05/16 0521 05/05/16 2146 05/06/16 0445 05/06/16 0800  BP: 106/75 106/71 (!) 101/57 118/63  Pulse: (!) 130 72 65 67  Resp: 18 18 18    Temp: 98.3 F (36.8 C) 97.8 F (36.6 C) 97.8 F (36.6 C)   TempSrc: Oral Oral Oral   SpO2: 99% 100% 99%   Weight:      Height:        Intake/Output Summary (Last 24 hours) at 05/06/16 0811 Last data filed at 05/05/16 1800  Gross per 24 hour  Intake              720 ml  Output              400 ml  Net              320 ml    Telemetry reveals sinus rhythm   GEN- The patient is well appearing, alert and oriented x 3 today.   Head- normocephalic, atraumatic Eyes-  Sclera clear, conjunctiva pink Ears- hearing intact Oropharynx- clear Neck- supple Lungs- Clear to ausculation bilaterally, normal work of breathing Heart- Regular rate and rhythm GI- soft, NT, ND, + BS Extremities- no clubbing, cyanosis, or edema Skin- no rash or lesion Psych- euthymic mood, full affect Neuro- strength and sensation are intact  LABS: Basic Metabolic Panel:  Recent Labs  83/25/49 0310 05/06/16 0319  NA 141 141  K 4.3 3.9  CL 106 108  CO2 28 27  GLUCOSE 99 96  BUN 24* 21*  CREATININE 1.36* 1.19  CALCIUM 8.6* 8.4*  MG 2.3 2.3    ASSESSMENT AND PLAN:  Principal Problem:   Atrial fibrillation, persistent (HCC) Active Problems:   Hyperlipidemia   Essential hypertension  1.  Persistent atrial fibrillation Admitted for Tikosyn load, converted to SR  BMET, Mg, QTc stable Continue Eliquis long term for CHADS2VASC of 3 Ok to shower while  here  2.  HTN Stable No change required today  Plan discharge tomorrow    Gypsy Balsam, NP 05/06/2016 8:12 AM  I have seen, examined the patient, and reviewed the above assessment and plan.  On exam, RRR.  He has converted to sinus rhythm.  QT is stable.  Changes to above are made where necessary.    Co Sign: Hillis Range, MD 05/06/2016 11:04 AM

## 2016-05-07 LAB — BASIC METABOLIC PANEL
ANION GAP: 8 (ref 5–15)
BUN: 15 mg/dL (ref 6–20)
CHLORIDE: 106 mmol/L (ref 101–111)
CO2: 26 mmol/L (ref 22–32)
Calcium: 8.7 mg/dL — ABNORMAL LOW (ref 8.9–10.3)
Creatinine, Ser: 1.07 mg/dL (ref 0.61–1.24)
GFR calc Af Amer: >60 mL/min (ref >60)
Glucose, Bld: 97 mg/dL (ref 65–99)
Potassium: 4.2 mmol/L (ref 3.5–5.1)
SODIUM: 140 mmol/L (ref 135–145)

## 2016-05-07 LAB — MAGNESIUM: MAGNESIUM: 2.3 mg/dL (ref 1.7–2.4)

## 2016-05-07 MED ORDER — DOFETILIDE 250 MCG PO CAPS: 250.0000 ug | ORAL_CAPSULE | Freq: Two times a day (BID) | ORAL | 3 refills | Status: DC

## 2016-05-07 MED ORDER — POTASSIUM CHLORIDE CRYS ER 10 MEQ PO TBCR: 10.0000 meq | EXTENDED_RELEASE_TABLET | Freq: Every day | ORAL | 3 refills | Status: DC

## 2016-05-07 NOTE — Care Management Important Message (Signed)
Important Message  Patient Details  Name: TAMAURI FOSBERG MRN: [id number] Date of Birth: Jul 07, 1927   Medicare Important Message Given:  Yes    Kyla Balzarine 05/07/2016, 12:36 PM

## 2016-05-07 NOTE — Progress Notes (Signed)
    SUBJECTIVE: The patient is doing well today.  At this time, he denies chest pain, shortness of breath, or any new concerns.  CURRENT MEDICATIONS: . apixaban  5 mg Oral BID  . carvedilol  12.5 mg Oral BID WC  . dofetilide  250 mcg Oral BID  . furosemide  20 mg Oral Daily  . potassium chloride  10 mEq Oral Daily  . rosuvastatin  20 mg Oral Daily  . sodium chloride flush  3 mL Intravenous Q12H     OBJECTIVE: Physical Exam: Vitals:   05/06/16 0800 05/06/16 1333 05/06/16 2101 05/07/16 0624  BP: 118/63 120/74 113/69 117/65  Pulse: 67 (!) 58 74 72  Resp:  18 18 18   Temp:  98.3 F (36.8 C) 97.4 F (36.3 C) 98.3 F (36.8 C)  TempSrc:  Oral Axillary Oral  SpO2:  100% 100% 94%  Weight:      Height:        Intake/Output Summary (Last 24 hours) at 05/07/16 1012 Last data filed at 05/06/16 1700  Gross per 24 hour  Intake              480 ml  Output              650 ml  Net             -170 ml    Telemetry reveals sinus rhythm   GEN- The patient is well appearing, alert and oriented x 3 today.   Head- normocephalic, atraumatic Eyes-  Sclera clear, conjunctiva pink Ears- hearing intact Oropharynx- clear Neck- supple Lungs- Clear to ausculation bilaterally, normal work of breathing Heart- Regular rate and rhythm GI- soft, NT, ND, + BS Extremities- no clubbing, cyanosis, or edema Skin- no rash or lesion Psych- euthymic mood, full affect Neuro- strength and sensation are intact  LABS: Basic Metabolic Panel:  Recent Labs  55/97/41 0319 05/07/16 0310  NA 141 140  K 3.9 4.2  CL 108 106  CO2 27 26  GLUCOSE 96 97  BUN 21* 15  CREATININE 1.19 1.07  CALCIUM 8.4* 8.7*  MG 2.3 2.3    ASSESSMENT AND PLAN:  Principal Problem:   Atrial fibrillation, persistent (HCC) Active Problems:   Hyperlipidemia   Essential hypertension  1.  Persistent atrial fibrillation Admitted for Tikosyn load, converted to SR  BMET, Mg, QTc stable Continue Eliquis long term for  CHADS2VASC of 3  2.  HTN Stable No change required today  DC to home today.  Follow-up with AF clinic in 1 week.  I will see 06/16/16 as scheduled  Hillis Range, MD 05/07/2016 10:12 AM

## 2016-05-07 NOTE — Discharge Instructions (Signed)
You have an appointment set up with the Atrial Fibrillation Clinic.  Multiple studies have shown that being followed by a dedicated atrial fibrillation clinic in addition to the standard care you receive from your other physicians improves health. We believe that enrollment in the atrial fibrillation clinic will allow us to better care for you.  ° °The phone number to the Atrial Fibrillation Clinic is [phone]. The clinic is staffed Monday through Friday from 8:30am to 5pm. ° °Parking Directions: The clinic is located in the Heart and Vascular Building connected to [facility]. °1)From [address] turn on to [address] and go to the 3rd entrance  (Heart and Vascular entrance) on the right. °2)Look to the right for Heart &Vascular Parking Garage. °3)A code for the entrance is 5002.   °4)Take the elevators to the 1st floor. Registration is in the room with the glass walls at the end of the hallway. ° °If you have any trouble parking or locating the clinic, please don’t hesitate to call [phone]. °

## 2016-05-07 NOTE — Progress Notes (Signed)
Patient provided with Tikosyn 1 week supply through CM, CM verified [facility] [address] [facility] in [facility] within week.

## 2016-05-12 ENCOUNTER — Encounter (HOSPITAL_COMMUNITY): Payer: Self-pay | Admitting: Nurse Practitioner

## 2016-05-12 ENCOUNTER — Other Ambulatory Visit (HOSPITAL_COMMUNITY): Payer: Self-pay | Admitting: *Deleted

## 2016-05-12 ENCOUNTER — Ambulatory Visit (HOSPITAL_COMMUNITY)
Admission: RE | Admit: 2016-05-12 | Discharge: 2016-05-12 | Disposition: A | Discharge status: Home / Self Care | Payer: Medicare Other | Source: Ambulatory Visit | Attending: Nurse Practitioner | Admitting: Nurse Practitioner

## 2016-05-12 VITALS — BP 122/74 | HR 58 | Ht 74.0 in | Wt 177.0 lb

## 2016-05-12 DIAGNOSIS — Z79899 Other long term (current) drug therapy: Secondary | ICD-10-CM | POA: Insufficient documentation

## 2016-05-12 DIAGNOSIS — I481 Persistent atrial fibrillation: Secondary | ICD-10-CM | POA: Diagnosis not present

## 2016-05-12 DIAGNOSIS — Z7901 Long term (current) use of anticoagulants: Secondary | ICD-10-CM | POA: Insufficient documentation

## 2016-05-12 DIAGNOSIS — I498 Other specified cardiac arrhythmias: Secondary | ICD-10-CM | POA: Diagnosis not present

## 2016-05-12 DIAGNOSIS — I4819 Other persistent atrial fibrillation: Secondary | ICD-10-CM

## 2016-05-12 LAB — BASIC METABOLIC PANEL
Anion gap: 7 (ref 5–15)
BUN: 17 mg/dL (ref 6–20)
CHLORIDE: 105 mmol/L (ref 101–111)
CO2: 28 mmol/L (ref 22–32)
Calcium: 9.1 mg/dL (ref 8.9–10.3)
Creatinine, Ser: 1.1 mg/dL (ref 0.61–1.24)
GFR calc Af Amer: >60 mL/min (ref >60)
GFR calc non Af Amer: 59 mL/min — ABNORMAL LOW (ref >60)
GLUCOSE: 122 mg/dL — AB (ref 65–99)
POTASSIUM: 4.2 mmol/L (ref 3.5–5.1)
Sodium: 140 mmol/L (ref 135–145)

## 2016-05-12 LAB — MAGNESIUM: MAGNESIUM: 2.3 mg/dL (ref 1.7–2.4)

## 2016-05-12 MED ORDER — FUROSEMIDE 20 MG PO TABS: 20.0000 mg | ORAL_TABLET | ORAL | 1 refills | Status: DC

## 2016-05-12 NOTE — Progress Notes (Signed)
Primary Care Physician: Minda Meo, MD Referring Physician:Dr. Johney Frame   Sean Bowman is a 83 y.o. male with a h/o persistent afib that was diagnosed in December of 2017. He was started on anticoagulation and weeks later had DCCV which was unsuccessful. At time of cardioversion Cardizem was doubled for better rate control and simvastatin was stopped due to drug drug interaction concerns.  He is in the afib clinic for f/u.He is rate controlled. He is unaware that he is in afib . 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 He continues on apixaban. Had a nosebleed this am which he was able to control. Echo is pending tomorrow. Mild ankle edema since increasing dose of cardizem.  Echo showed EF of 40-45%. It was decided that he would benefit from restoring SR with tikosyn with probable TMC. He was admitted for Tikosyn initiation.He returned to SR and qtc was stable. He is in the afib clinic today for f/u. He is planning a trip to [place] for a week. He feels well. Has not noted any irregular heart beat.  Today, he denies symptoms of palpitations, chest pain, shortness of breath, orthopnea, PND, mild bilateral ankle edema, dizziness, presyncope, syncope, or neurologic sequela. The patient is tolerating medications without difficulties and is otherwise without complaint today.   Past Medical History:  Diagnosis Date  . Arthralgia   . Benign positional vertigo   . HTN (hypertension)   . Hyperlipidemia   . Impaired glucose tolerance   . Persistent atrial fibrillation (HCC)   . Senile purpura (HCC)    Past Surgical History:  Procedure Laterality Date  . CARDIOVERSION N/A 03/29/2016   Procedure: CARDIOVERSION;  Surgeon: Thurmon Fair, MD;  [facility];  Service: Cardiovascular;  Laterality: N/A;  . CATARACT EXTRACTION W/ INTRAOCULAR LENS  IMPLANT, BILATERAL Bilateral 2017  . ORIF METACARPAL FRACTURE  Left ~ 2016   S/P fall  . TONSILLECTOMY      Current Outpatient Prescriptions  Medication Sig Dispense Refill  . apixaban (ELIQUIS) 5 MG TABS tablet Take 1 tablet (5 mg total) by mouth 2 (two) times daily. 60 tablet 2  . carvedilol (COREG) 12.5 MG tablet Take 1 tablet (12.5 mg total) by mouth 2 (two) times daily with a meal. 60 tablet 3  . dofetilide (TIKOSYN) 250 MCG capsule Take 1 capsule (250 mcg total) by mouth 2 (two) times daily. 60 capsule 3  . furosemide (LASIX) 20 MG tablet Take 1 tablet (20 mg total) by mouth daily. 30 tablet 1  . potassium chloride (K-DUR,KLOR-CON) 10 MEQ tablet Take 1 tablet (10 mEq total) by mouth daily. 30 tablet 3  . rosuvastatin (CRESTOR) 20 MG tablet Take 20 mg by mouth daily.  5   No current facility-administered medications for this encounter.     No Known Allergies  Social History   Social History  . Marital status: Married    Spouse name: N/A  . Number of children: N/A  . Years of education: N/A   Occupational History  . Not on file.   Social History Main Topics  . Smoking status: Never Smoker  . Smokeless tobacco: Never Used  . Alcohol use 1.2 oz/week    2 Shots of liquor per week  . Drug use: No  . Sexual activity: Not on file   Other Topics Concern  . 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    Family History  Problem Relation Age of Onset  . Stroke Mother   . Alzheimer's disease Mother   . Alzheimer's disease Father     ROS- All systems are reviewed and negative except as per the HPI above  Physical Exam: Vitals:   05/12/16 1140  BP: 122/74  Pulse: (!) 58  Weight: 177 lb (80.3 kg)  Height: 6\' 2"  (1.88 m)   Wt Readings from Last 3 Encounters:  05/12/16 177 lb (80.3 kg)  05/04/16 175 lb 3.2 oz (79.5 kg)  04/30/16 179 lb 9.6 oz (81.5 kg)    Labs: Lab Results  Component Value Date   NA 140  05/12/2016   K 4.2 05/12/2016   CL 105 05/12/2016   CO2 28 05/12/2016   GLUCOSE 122 (H) 05/12/2016   BUN 17 05/12/2016   CREATININE 1.10 05/12/2016   CALCIUM 9.1 05/12/2016   MG 2.3 05/12/2016   No results found for: INR No results found for: CHOL, HDL, LDLCALC, TRIG   GEN- The patient is well appearing, alert and oriented x 3 today.   Head- normocephalic, atraumatic Eyes-  Sclera clear, conjunctiva pink Ears- hearing intact Oropharynx- clear Neck- supple, no JVP Lymph- no cervical lymphadenopathy Lungs- Clear to ausculation bilaterally, normal work of breathing Heart- regular rate and rhythm, no murmurs, rubs or gallops, PMI not laterally displaced GI- soft, NT, ND, + BS Extremities- no clubbing, cyanosis, or  Mild edema around bilateral ankle area MS- no significant deformity or atrophy Skin- no rash or lesion Psych- euthymic mood, full affect Neuro- strength and sensation are intact  EKG-SR at 58 bpm, pr int 172 ms, qrs int 86 ms, qtc 473 ms  Epic records reviewed  Echo Study Conclusions  - Left ventricle: The cavity size was normal. Wall thickness was   normal. Systolic function was mildly to moderately reduced. The   estimated ejection fraction was in the range of 40% to 45%. - Mitral valve: There was moderate regurgitation. - Left atrium: The atrium was mildly dilated. - Pulmonary arteries: Systolic pressure was mildly to moderately   increased. PA peak pressure: 39 mm Hg (S).    Assessment and Plan: 1. afib In SR with tikosyn Continue dofetilide 250 mcg a day Drug precautions discussed Continue apixaban 5 mg bid Continue on carvedilol at 12.5 mg qd He wants to stop lasix, try going to qod and watch for LLE, if he has return of fluid, return to daily bmet/mag   F/u with Dr. Johney Frame 4/11  [facility] [address] [place], [place] [address] [phone]

## 2016-05-14 ENCOUNTER — Inpatient Hospital Stay (HOSPITAL_COMMUNITY): Admit: 2016-05-14 | Payer: Medicare Other | Admitting: Nurse Practitioner

## 2016-06-02 DIAGNOSIS — L570 Actinic keratosis: Secondary | ICD-10-CM | POA: Diagnosis not present

## 2016-06-15 DIAGNOSIS — Z961 Presence of intraocular lens: Secondary | ICD-10-CM | POA: Diagnosis not present

## 2016-06-15 DIAGNOSIS — H18413 Arcus senilis, bilateral: Secondary | ICD-10-CM | POA: Diagnosis not present

## 2016-06-15 DIAGNOSIS — H02839 Dermatochalasis of unspecified eye, unspecified eyelid: Secondary | ICD-10-CM | POA: Diagnosis not present

## 2016-06-15 DIAGNOSIS — H401131 Primary open-angle glaucoma, bilateral, mild stage: Secondary | ICD-10-CM | POA: Diagnosis not present

## 2016-06-16 ENCOUNTER — Encounter: Payer: Self-pay | Admitting: Internal Medicine

## 2016-06-16 ENCOUNTER — Ambulatory Visit (INDEPENDENT_AMBULATORY_CARE_PROVIDER_SITE_OTHER): Payer: Medicare Other | Admitting: Internal Medicine

## 2016-06-16 VITALS — BP 126/82 | HR 63 | Ht 74.0 in | Wt 180.2 lb

## 2016-06-16 DIAGNOSIS — I10 Essential (primary) hypertension: Secondary | ICD-10-CM

## 2016-06-16 DIAGNOSIS — I481 Persistent atrial fibrillation: Secondary | ICD-10-CM

## 2016-06-16 DIAGNOSIS — I4819 Other persistent atrial fibrillation: Secondary | ICD-10-CM

## 2016-06-16 DIAGNOSIS — I519 Heart disease, unspecified: Secondary | ICD-10-CM

## 2016-06-16 MED ORDER — FUROSEMIDE 20 MG PO TABS: 20.0000 mg | ORAL_TABLET | Freq: Every day | ORAL | 1 refills | Status: DC | PRN

## 2016-06-16 MED ORDER — POTASSIUM CHLORIDE CRYS ER 10 MEQ PO TBCR: 10.0000 meq | EXTENDED_RELEASE_TABLET | Freq: Every day | ORAL | 3 refills | Status: DC | PRN

## 2016-06-16 NOTE — Patient Instructions (Addendum)
Medication Instructions:  Your physician has recommended you make the following change in your medication:   Take your Furosemide and Potassium as needed.  Weigh yourself daily if weight goes up by 2-3 pounds take both that day   Labwork: None ordered   Testing/Procedures: None ordered   Follow-Up: Your physician recommends that you schedule a follow-up appointment in: 6 weeks with [name], NP and 3 months with Dr Johney Frame   Any Other Special Instructions Will Be Listed Below (If Applicable).     If you need a refill on your cardiac medications before your next appointment, please call your pharmacy.

## 2016-06-16 NOTE — Progress Notes (Signed)
Electrophysiology Office Note   Date:  06/16/2016   ID:  Sean Bowman, DOB 1927/04/09, MRN [id number]  PCP:  Minda Meo, MD  Cardiologist:  Dr Allyson Sabal Primary Electrophysiologist: Hillis Range, MD    CC: afib   History of Present Illness: Sean Bowman is a 83 y.o. male who presents today for electrophysiology follow-up.  Doing well with tikosyn.  Maintaining sinus rhythm.  Edema is improved.  Today, he denies symptoms of exertional chest pain, , orthopnea, PND,  claudication, dizziness, presyncope, syncope, bleeding, or neurologic sequela. The patient is tolerating medications without difficulties and is otherwise without complaint today.    Past Medical History:  Diagnosis Date  . Arthralgia   . Benign positional vertigo   . HTN (hypertension)   . Hyperlipidemia   . Impaired glucose tolerance   . Persistent atrial fibrillation (HCC)   . Senile purpura (HCC)    Past Surgical History:  Procedure Laterality Date  . CARDIOVERSION N/A 03/29/2016   Procedure: CARDIOVERSION;  Surgeon: Thurmon Fair, MD;  Location: [facility];  Service: Cardiovascular;  Laterality: N/A;  . CATARACT EXTRACTION W/ INTRAOCULAR LENS  IMPLANT, BILATERAL Bilateral 2017  . ORIF METACARPAL FRACTURE Left ~ 2016   S/P fall  . TONSILLECTOMY       Current Outpatient Prescriptions  Medication Sig Dispense Refill  . apixaban (ELIQUIS) 5 MG TABS tablet Take 1 tablet (5 mg total) by mouth 2 (two) times daily. 60 tablet 2  . carvedilol (COREG) 12.5 MG tablet Take 12.5 mg by mouth daily.  2  . dofetilide (TIKOSYN) 250 MCG capsule Take 1 capsule (250 mcg total) by mouth 2 (two) times daily. 60 capsule 3  . furosemide (LASIX) 20 MG tablet Take 1 tablet (20 mg total) by mouth every other day. 30 tablet 1  . potassium chloride (K-DUR,KLOR-CON) 10 MEQ tablet Take 1 tablet (10 mEq total) by mouth daily. 30 tablet 3  . rosuvastatin (CRESTOR) 20 MG tablet Take 20 mg by mouth daily.  5   No current  facility-administered medications for this visit.     Allergies:   Patient has no known allergies.   Social History:  The patient  reports that he has never smoked. He has never used smokeless tobacco. He reports that he drinks about 1.2 oz of alcohol per week . He reports that he does not use drugs.   Family History:  The patient's  family history includes Alzheimer's disease in his father and mother; Stroke in his mother.    ROS:  Please see the history of present illness.   All other systems are reviewed and negative.    PHYSICAL EXAM: VS:  BP 126/82   Pulse 63   Ht 6\' 2"  (1.88 m)   Wt 180 lb 3.2 oz (81.7 kg)   SpO2 99%   BMI 23.14 kg/m  , BMI Body mass index is 23.14 kg/m. GEN: Well nourished, well developed, in no acute distress, quite inquisitive about many things HEENT: normal  Neck: no JVD, carotid bruits, or masses Cardiac: RRR ; no murmurs, rubs, or gallops, no edema , 2+ DP/PT pulses Respiratory:  clear to auscultation bilaterally, normal work of breathing GI: soft, nontender, nondistended, + BS MS: no deformity or atrophy  Skin: warm and dry  Neuro:  Strength and sensation are intact Psych: euthymic mood, full affect  EKG:  EKG is ordered today. The ekg ordered today shows sinus rhythm 63 bpm, PR 168 msec, QRS 88 msec, QTc 454 msec,  personally reviewed   Wt Readings from Last 3 Encounters:  06/16/16 180 lb 3.2 oz (81.7 kg)  05/12/16 177 lb (80.3 kg)  05/04/16 175 lb 3.2 oz (79.5 kg)    ASSESSMENT AND PLAN:  1.  Persistent afib Maintaining sinus rhythm with tikosyn Qt is stable Recent labs reviewed chads2vasc score is 2 (age, htn).  Continue eliquis 5mg  BID  Follow-up in AF clinic in 6 weeks   2. HTN Stable No change required today  3. Chronic systolic dysfunction Presumed to be due to afib with RVR Repeat echo upon return Stop lasix and KDur per patient request Daily weights advised He should take lasix prn  afib clinic in 6 weeks I will see  in 3 months  Signed, Hillis Range, MD  06/16/2016 12:43 PM     [facility] [address] [place] [address] [phone] (office) [phone] (fax)

## 2016-06-18 ENCOUNTER — Other Ambulatory Visit: Payer: Self-pay | Admitting: *Deleted

## 2016-06-18 MED ORDER — APIXABAN 5 MG PO TABS: 5.0000 mg | ORAL_TABLET | Freq: Two times a day (BID) | ORAL | 3 refills | Status: AC

## 2016-08-03 ENCOUNTER — Encounter: Payer: Self-pay | Admitting: Internal Medicine

## 2016-08-04 ENCOUNTER — Other Ambulatory Visit: Payer: Self-pay | Admitting: Dermatology

## 2016-08-04 DIAGNOSIS — D492 Neoplasm of unspecified behavior of bone, soft tissue, and skin: Secondary | ICD-10-CM | POA: Diagnosis not present

## 2016-08-04 DIAGNOSIS — I4891 Unspecified atrial fibrillation: Secondary | ICD-10-CM | POA: Diagnosis not present

## 2016-08-04 DIAGNOSIS — L432 Lichenoid drug reaction: Secondary | ICD-10-CM | POA: Diagnosis not present

## 2016-08-04 DIAGNOSIS — D692 Other nonthrombocytopenic purpura: Secondary | ICD-10-CM | POA: Diagnosis not present

## 2016-08-04 DIAGNOSIS — M25511 Pain in right shoulder: Secondary | ICD-10-CM | POA: Diagnosis not present

## 2016-08-04 DIAGNOSIS — R7302 Impaired glucose tolerance (oral): Secondary | ICD-10-CM | POA: Diagnosis not present

## 2016-08-04 DIAGNOSIS — L570 Actinic keratosis: Secondary | ICD-10-CM | POA: Diagnosis not present

## 2016-08-04 DIAGNOSIS — L821 Other seborrheic keratosis: Secondary | ICD-10-CM | POA: Diagnosis not present

## 2016-08-05 ENCOUNTER — Other Ambulatory Visit (HOSPITAL_COMMUNITY): Payer: Self-pay | Admitting: Nurse Practitioner

## 2016-08-06 ENCOUNTER — Encounter (HOSPITAL_COMMUNITY): Payer: Self-pay | Admitting: Nurse Practitioner

## 2016-08-06 ENCOUNTER — Ambulatory Visit (HOSPITAL_COMMUNITY)
Admission: RE | Admit: 2016-08-06 | Discharge: 2016-08-06 | Disposition: A | Discharge status: Home / Self Care | Payer: Medicare Other | Source: Ambulatory Visit | Attending: Nurse Practitioner | Admitting: Nurse Practitioner

## 2016-08-06 VITALS — BP 126/72 | HR 45 | Ht 74.0 in | Wt 180.8 lb

## 2016-08-06 DIAGNOSIS — Z7901 Long term (current) use of anticoagulants: Secondary | ICD-10-CM | POA: Insufficient documentation

## 2016-08-06 DIAGNOSIS — I10 Essential (primary) hypertension: Secondary | ICD-10-CM | POA: Diagnosis not present

## 2016-08-06 DIAGNOSIS — I481 Persistent atrial fibrillation: Secondary | ICD-10-CM | POA: Diagnosis not present

## 2016-08-06 DIAGNOSIS — Z79899 Other long term (current) drug therapy: Secondary | ICD-10-CM | POA: Insufficient documentation

## 2016-08-06 DIAGNOSIS — I498 Other specified cardiac arrhythmias: Secondary | ICD-10-CM | POA: Insufficient documentation

## 2016-08-06 DIAGNOSIS — I4891 Unspecified atrial fibrillation: Secondary | ICD-10-CM | POA: Diagnosis present

## 2016-08-06 DIAGNOSIS — E785 Hyperlipidemia, unspecified: Secondary | ICD-10-CM | POA: Insufficient documentation

## 2016-08-06 DIAGNOSIS — I4819 Other persistent atrial fibrillation: Secondary | ICD-10-CM

## 2016-08-06 LAB — BASIC METABOLIC PANEL
Anion gap: 6 (ref 5–15)
BUN: 12 mg/dL (ref 6–20)
CHLORIDE: 105 mmol/L (ref 101–111)
CO2: 28 mmol/L (ref 22–32)
CREATININE: 1.05 mg/dL (ref 0.61–1.24)
Calcium: 9 mg/dL (ref 8.9–10.3)
GFR calc Af Amer: >60 mL/min (ref >60)
GFR calc non Af Amer: >60 mL/min (ref >60)
GLUCOSE: 113 mg/dL — AB (ref 65–99)
Potassium: 4 mmol/L (ref 3.5–5.1)
Sodium: 139 mmol/L (ref 135–145)

## 2016-08-06 LAB — MAGNESIUM: Magnesium: 2.3 mg/dL (ref 1.7–2.4)

## 2016-08-06 NOTE — Addendum Note (Signed)
Addendum  created 08/06/16 0947 by Ashwika Freels, MD   Sign clinical note    

## 2016-08-06 NOTE — Patient Instructions (Signed)
Your physician has recommended you make the following change in your medication:  1)Decrease coreg to 6.25mg  twice a day

## 2016-08-06 NOTE — Progress Notes (Signed)
Primary Care Physician: Geoffry Paradise, MD Referring Physician:Dr. Johney Frame   Sean Bowman is a 83 y.o. male with a h/o persistent afib that was diagnosed in December of 2017. He was started on anticoagulation and weeks later had DCCV which was unsuccessful. At time of cardioversion Cardizem was doubled for better rate control and simvastatin was stopped due to drug drug interaction concerns.  He is in the afib clinic for f/u.He is rate controlled. He is unaware that he is in afib . 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 He continues on apixaban. Had a nosebleed this am which he was able to control. Echo is pending tomorrow. Mild ankle edema since increasing dose of cardizem.  Echo showed EF of 40-45%. It was decided that he would benefit from restoring SR with tikosyn with probable TMC. He was admitted for Tikosyn initiation.He returned to SR and qtc was stable. He is in the afib clinic today for f/u. He is planning a trip to [place] for a week. He feels well. Has not noted any irregular heart beat.  F/u in afib clinic 6/1. He is staying in SR but has brady at 45 bpm. He is taking lasix 20 mg qod.  Today, he denies symptoms of palpitations, chest pain, shortness of breath, orthopnea, PND, mild bilateral ankle edema, dizziness, presyncope, syncope, or neurologic sequela. The patient is tolerating medications without difficulties and is otherwise without complaint today.   Past Medical History:  Diagnosis Date  . Arthralgia   . Benign positional vertigo   . HTN (hypertension)   . Hyperlipidemia   . Impaired glucose tolerance   . Persistent atrial fibrillation (HCC)   . Senile purpura (HCC)    Past Surgical History:  Procedure Laterality Date  . CARDIOVERSION N/A 03/29/2016   Procedure: CARDIOVERSION;  Surgeon: Thurmon Fair, MD;  Location: [facility];  Service: Cardiovascular;  Laterality: N/A;  .  CATARACT EXTRACTION W/ INTRAOCULAR LENS  IMPLANT, BILATERAL Bilateral 2017  . ORIF METACARPAL FRACTURE Left ~ 2016   S/P fall  . TONSILLECTOMY      Current Outpatient Prescriptions  Medication Sig Dispense Refill  . apixaban (ELIQUIS) 5 MG TABS tablet Take 1 tablet (5 mg total) by mouth 2 (two) times daily. 180 tablet 3  . carvedilol (COREG) 12.5 MG tablet Take 6.25 mg by mouth 2 (two) times daily.  2  . dofetilide (TIKOSYN) 250 MCG capsule Take 1 capsule (250 mcg total) by mouth 2 (two) times daily. 60 capsule 3  . furosemide (LASIX) 20 MG tablet Take 1 tablet daily as needed for weight gain or swelling 30 tablet 0  . potassium chloride (K-DUR,KLOR-CON) 10 MEQ tablet Take 1 tablet (10 mEq total) by mouth daily as needed (with Furosemide as needed for fluid). 30 tablet 3  . rosuvastatin (CRESTOR) 20 MG tablet Take 20 mg by mouth daily.  5   No current facility-administered medications for this encounter.     No Known Allergies  Social History   Social History  . Marital status: Married    Spouse name: N/A  . Number of children: N/A  . Years of education: N/A   Occupational History  . Not on file.   Social History Main Topics  . Smoking status: Never Smoker  . Smokeless tobacco: Never Used  . Alcohol use 1.2 oz/week    2 Shots of liquor per week  . Drug use: No  .  Sexual activity: Not on file   Other Topics Concern  . 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    Family History  Problem Relation Age of Onset  . Stroke Mother   . Alzheimer's disease Mother   . Alzheimer's disease Father     ROS- All systems are reviewed and negative except as per the HPI above  Physical Exam: Vitals:   08/06/16 1129  BP: 126/72  Pulse: (!) 45  Weight: 180 lb 12.8 oz (82 kg)  Height: 6\' 2"  (1.88 m)   Wt Readings from Last 3 Encounters:  08/06/16 180 lb  12.8 oz (82 kg)  06/16/16 180 lb 3.2 oz (81.7 kg)  05/12/16 177 lb (80.3 kg)    Labs: Lab Results  Component Value Date   NA 139 08/06/2016   K 4.0 08/06/2016   CL 105 08/06/2016   CO2 28 08/06/2016   GLUCOSE 113 (H) 08/06/2016   BUN 12 08/06/2016   CREATININE 1.05 08/06/2016   CALCIUM 9.0 08/06/2016   MG 2.3 08/06/2016   No results found for: INR No results found for: CHOL, HDL, LDLCALC, TRIG   GEN- The patient is well appearing, alert and oriented x 3 today.   Head- normocephalic, atraumatic Eyes-  Sclera clear, conjunctiva pink Ears- hearing intact Oropharynx- clear Neck- supple, no JVP Lymph- no cervical lymphadenopathy Lungs- Clear to ausculation bilaterally, normal work of breathing Heart- regular rate and rhythm, no murmurs, rubs or gallops, PMI not laterally displaced GI- soft, NT, ND, + BS Extremities- no clubbing, cyanosis, or  Mild edema around bilateral ankle area MS- no significant deformity or atrophy Skin- no rash or lesion Psych- euthymic mood, full affect Neuro- strength and sensation are intact  EKG-SR at 45 bpm, pr int 178 ms, qrs int 90 ms, qtc 435 ms  Epic records reviewed  Echo Study Conclusions  - Left ventricle: The cavity size was normal. Wall thickness was   normal. Systolic function was mildly to moderately reduced. The   estimated ejection fraction was in the range of 40% to 45%. - Mitral valve: There was moderate regurgitation. - Left atrium: The atrium was mildly dilated. - Pulmonary arteries: Systolic pressure was mildly to moderately   increased. PA peak pressure: 39 mm Hg (S).    Assessment and Plan: 1. afib In SR with tikosyn Continue dofetilide 250 mcg a day Continue apixaban 5 mg bid Decrease carvedilol  to 6.25 mg bid Continue lasix at 20 mg qod with k+ 10 meq every day Bmet/mag today   F/u 2 weeks with EKG/BP f/u with change in coreg Dr. Johney Frame as scheduled in July  Cela Newcom [facility] [address] [place], [place] [address] [phone]

## 2016-08-17 ENCOUNTER — Other Ambulatory Visit: Payer: Self-pay | Admitting: Internal Medicine

## 2016-08-18 ENCOUNTER — Ambulatory Visit (HOSPITAL_COMMUNITY)
Admission: RE | Admit: 2016-08-18 | Discharge: 2016-08-18 | Disposition: A | Discharge status: Home / Self Care | Payer: Medicare Other | Source: Ambulatory Visit | Attending: Nurse Practitioner | Admitting: Nurse Practitioner

## 2016-08-18 DIAGNOSIS — R001 Bradycardia, unspecified: Secondary | ICD-10-CM | POA: Insufficient documentation

## 2016-08-18 MED ORDER — CARVEDILOL 3.125 MG PO TABS: 3.1250 mg | ORAL_TABLET | Freq: Two times a day (BID) | ORAL | 11 refills | Status: DC

## 2016-08-18 NOTE — Patient Instructions (Signed)
Decrease Carvedilol to 3.125 twice a day

## 2016-08-18 NOTE — Progress Notes (Addendum)
Pt in for repeat EKG since decreasing Coreg to 6.25 bid.  EKG to be reviewed by [name], NP  Decreased coreg to 6.25 mg bid on last visit for heart rate in the 40's. EKG still shows HR in the 40's. C/o fatigue but no presyncope. Staying in SR. Will try to decrease coreg to 3.125 mg bid and return for EKG next week.

## 2016-08-25 ENCOUNTER — Ambulatory Visit (HOSPITAL_COMMUNITY)
Admission: RE | Admit: 2016-08-25 | Discharge: 2016-08-25 | Disposition: A | Discharge status: Home / Self Care | Payer: Medicare Other | Source: Ambulatory Visit | Attending: Nurse Practitioner | Admitting: Nurse Practitioner

## 2016-08-25 DIAGNOSIS — I4891 Unspecified atrial fibrillation: Secondary | ICD-10-CM | POA: Insufficient documentation

## 2016-08-25 DIAGNOSIS — I498 Other specified cardiac arrhythmias: Secondary | ICD-10-CM | POA: Diagnosis not present

## 2016-08-25 NOTE — Progress Notes (Addendum)
Pt in for repeat EKG and BP. EKG to be reviewed by [name], NP.     Pt has had recent brady,in the 40's despite decreasing BB. Right after tikosyn was administered, HR was in the 60's. He feels tired but no presyncope. He is due to see Dr. Johney Frame soon and will request appoint in the next 1-2 weeks HR at 48 bpm,.PR int 178 ms, qrs int 86 ms, qtc 435 ms.

## 2016-08-30 ENCOUNTER — Telehealth (HOSPITAL_COMMUNITY): Payer: Self-pay | Admitting: *Deleted

## 2016-08-30 NOTE — Telephone Encounter (Signed)
Pt cld today about appt with Allred.  He needs sooner than 09/13/2016 due to bradycardia.  HR is in the 40s.  I sent a note to scheduler.

## 2016-09-02 ENCOUNTER — Other Ambulatory Visit: Payer: Self-pay | Admitting: Physician Assistant

## 2016-09-06 ENCOUNTER — Encounter: Payer: Self-pay | Admitting: Internal Medicine

## 2016-09-06 ENCOUNTER — Ambulatory Visit (INDEPENDENT_AMBULATORY_CARE_PROVIDER_SITE_OTHER): Payer: Medicare Other | Admitting: Internal Medicine

## 2016-09-06 VITALS — BP 120/72 | HR 53 | Ht 74.0 in | Wt 176.2 lb

## 2016-09-06 DIAGNOSIS — I4819 Other persistent atrial fibrillation: Secondary | ICD-10-CM

## 2016-09-06 DIAGNOSIS — I481 Persistent atrial fibrillation: Secondary | ICD-10-CM | POA: Diagnosis not present

## 2016-09-06 DIAGNOSIS — I10 Essential (primary) hypertension: Secondary | ICD-10-CM | POA: Diagnosis not present

## 2016-09-06 DIAGNOSIS — I519 Heart disease, unspecified: Secondary | ICD-10-CM | POA: Diagnosis not present

## 2016-09-06 DIAGNOSIS — I428 Other cardiomyopathies: Secondary | ICD-10-CM | POA: Diagnosis not present

## 2016-09-06 NOTE — Patient Instructions (Addendum)
Medication Instructions:   Your physician has recommended you make the following change in your medication:  1) STOP Carvedilol  - If you need a refill on your cardiac medications before your next appointment, please call your pharmacy.   Labwork:  None ordered  Testing/Procedures: Your physician has requested that you have an echocardiogram in 3 months - before your follow up with Dr. Johney Frame. Echocardiography is a painless test that uses sound waves to create images of your heart. It provides your doctor with information about the size and shape of your heart and how well your heart's chambers and valves are working. This procedure takes approximately one hour. There are no restrictions for this procedure.  Follow-Up:  Your physician recommends that you schedule a follow-up appointment in: 3 months with Dr. Johney Frame. (after echocardiogram)  Thank you for choosing [facility]!!

## 2016-09-06 NOTE — Progress Notes (Signed)
   PCP: Geoffry Paradise, MD  Sean Bowman is a 83 y.o. male who presents today for routine electrophysiology followup.  Since last being seen in our clinic, the patient reports doing very well. He is maintaining sinus rhythm.  No concerns. Today, he denies symptoms of palpitations, chest pain, shortness of breath,  lower extremity edema, dizziness, presyncope, or syncope.  The patient is otherwise without complaint today.   Past Medical History:  Diagnosis Date  . Arthralgia   . Benign positional vertigo   . HTN (hypertension)   . Hyperlipidemia   . Impaired glucose tolerance   . Persistent atrial fibrillation (HCC)   . Senile purpura (HCC)    Past Surgical History:  Procedure Laterality Date  . CARDIOVERSION N/A 03/29/2016   Procedure: CARDIOVERSION;  Surgeon: Thurmon Fair, MD;  [facility];  Service: Cardiovascular;  Laterality: N/A;  . CATARACT EXTRACTION W/ INTRAOCULAR LENS  IMPLANT, BILATERAL Bilateral 2017  . ORIF METACARPAL FRACTURE Left ~ 2016   S/P fall  . TONSILLECTOMY      ROS- all systems are reviewed and negatives except as per HPI above  Current Outpatient Prescriptions  Medication Sig Dispense Refill  . apixaban (ELIQUIS) 5 MG TABS tablet Take 1 tablet (5 mg total) by mouth 2 (two) times daily. 180 tablet 3  . carvedilol (COREG) 3.125 MG tablet Take 1 tablet (3.125 mg total) by mouth 2 (two) times daily. 60 tablet 11  . dofetilide (TIKOSYN) 250 MCG capsule TAKE 1 CAPSULE (250 MCG TOTAL) BY MOUTH 2 (TWO) TIMES DAILY. 60 capsule 10  . furosemide (LASIX) 20 MG tablet Take 1 tablet by mouth daily as needed for weight gain or swelling    . potassium chloride (K-DUR,KLOR-CON) 10 MEQ tablet Take 1 tablet (10 mEq total) by mouth daily as needed (with Furosemide as needed for fluid). 30 tablet 3  . rosuvastatin (CRESTOR) 20 MG tablet Take 20 mg by mouth daily.  5   No current facility-administered medications for this visit.     Physical Exam: Vitals:   09/06/16 1650  BP: 120/72  Pulse: (!) 53  SpO2: 96%  Weight: 176 lb 3.2 oz (79.9 kg)  Height: 6\' 2"  (1.88 m)    GEN- The patient is well appearing, alert and oriented x 3 today.   Head- normocephalic, atraumatic Eyes-  Sclera clear, conjunctiva pink Ears- hearing intact Oropharynx- clear Lungs- Clear to ausculation bilaterally, normal work of breathing Heart- Regular rate and rhythm, no murmurs, rubs or gallops, PMI not laterally displaced GI- soft, NT, ND, + BS Extremities- no clubbing, cyanosis, or edema  EKG tracing ordered today is personally reviewed and shows sinus bradycardia 53 bpm, QTc 435 msec  Assessment and Plan:  1. Persistent afib Maintaining sinus rhythm with tikosyn On eliquis Qt is stable  2. Nonischemic CM Likely due to afib Repeat echo in 3 months  3.  Sinus bradycardia Asymptomatic Stop coreg  Return to see me with an echo in 3 months  Hillis Range MD, [facility] 09/06/2016 5:01 PM

## 2016-09-10 ENCOUNTER — Telehealth: Payer: Self-pay | Admitting: Internal Medicine

## 2016-09-10 NOTE — Telephone Encounter (Signed)
New message      Pt was recently seen.  He forgot to ask Dr Johney Frame if it is ok for him to fly to [place] on 09-22-16. He states he has been having low heart rate problems.  Please call between 2-6pm today.

## 2016-09-10 NOTE — Telephone Encounter (Signed)
Spoke with pt, he would like to know if dr allred has any reservations about him flying to [place] [employer]. He will be leaving in 2 weeks. Will forward to dr allred to review and advise.

## 2016-09-13 ENCOUNTER — Ambulatory Visit: Payer: Medicare Other | Admitting: Internal Medicine

## 2016-09-13 NOTE — Telephone Encounter (Signed)
I am ok with him flying to [place].

## 2016-09-14 NOTE — Telephone Encounter (Signed)
Spoke with pt, aware of dr allred recommendations.

## 2016-10-15 ENCOUNTER — Ambulatory Visit: Payer: Medicare Other | Admitting: Internal Medicine

## 2016-11-29 ENCOUNTER — Encounter (HOSPITAL_COMMUNITY): Payer: Self-pay | Admitting: Nurse Practitioner

## 2016-11-29 ENCOUNTER — Ambulatory Visit (HOSPITAL_COMMUNITY)
Admission: RE | Admit: 2016-11-29 | Discharge: 2016-11-29 | Disposition: A | Discharge status: Home / Self Care | Payer: Medicare Other | Source: Ambulatory Visit | Attending: Nurse Practitioner | Admitting: Nurse Practitioner

## 2016-11-29 ENCOUNTER — Telehealth (HOSPITAL_COMMUNITY): Payer: Self-pay | Admitting: Nurse Practitioner

## 2016-11-29 VITALS — BP 148/86 | HR 69 | Ht 74.0 in | Wt 181.2 lb

## 2016-11-29 DIAGNOSIS — I481 Persistent atrial fibrillation: Secondary | ICD-10-CM | POA: Insufficient documentation

## 2016-11-29 DIAGNOSIS — E785 Hyperlipidemia, unspecified: Secondary | ICD-10-CM | POA: Insufficient documentation

## 2016-11-29 DIAGNOSIS — R0789 Other chest pain: Secondary | ICD-10-CM | POA: Insufficient documentation

## 2016-11-29 DIAGNOSIS — Z7901 Long term (current) use of anticoagulants: Secondary | ICD-10-CM | POA: Diagnosis not present

## 2016-11-29 DIAGNOSIS — I4819 Other persistent atrial fibrillation: Secondary | ICD-10-CM

## 2016-11-29 DIAGNOSIS — Z79899 Other long term (current) drug therapy: Secondary | ICD-10-CM | POA: Diagnosis not present

## 2016-11-29 DIAGNOSIS — Z823 Family history of stroke: Secondary | ICD-10-CM | POA: Insufficient documentation

## 2016-11-29 DIAGNOSIS — R079 Chest pain, unspecified: Secondary | ICD-10-CM

## 2016-11-29 DIAGNOSIS — I700 Atherosclerosis of aorta: Secondary | ICD-10-CM | POA: Diagnosis not present

## 2016-11-29 DIAGNOSIS — I10 Essential (primary) hypertension: Secondary | ICD-10-CM | POA: Insufficient documentation

## 2016-11-29 DIAGNOSIS — S2243XA Multiple fractures of ribs, bilateral, initial encounter for closed fracture: Secondary | ICD-10-CM | POA: Insufficient documentation

## 2016-11-29 LAB — BASIC METABOLIC PANEL
Anion gap: 6 (ref 5–15)
BUN: 16 mg/dL (ref 6–20)
CO2: 27 mmol/L (ref 22–32)
Calcium: 9 mg/dL (ref 8.9–10.3)
Chloride: 105 mmol/L (ref 101–111)
Creatinine, Ser: 0.76 mg/dL (ref 0.61–1.24)
GFR calc Af Amer: >60 mL/min (ref >60)
GFR calc non Af Amer: >60 mL/min (ref >60)
Glucose, Bld: 119 mg/dL — ABNORMAL HIGH (ref 65–99)
Potassium: 4 mmol/L (ref 3.5–5.1)
SODIUM: 138 mmol/L (ref 135–145)

## 2016-11-29 LAB — MAGNESIUM: MAGNESIUM: 2.2 mg/dL (ref 1.7–2.4)

## 2016-11-29 LAB — CBC
HEMATOCRIT: 41 % (ref 39.0–52.0)
HEMOGLOBIN: 13.6 g/dL (ref 13.0–17.0)
MCH: 31.9 pg (ref 26.0–34.0)
MCHC: 33.2 g/dL (ref 30.0–36.0)
MCV: 96.2 fL (ref 78.0–100.0)
Platelets: 232 10*3/uL (ref 150–400)
RBC: 4.26 MIL/uL (ref 4.22–5.81)
RDW: 13.9 % (ref 11.5–15.5)
WBC: 10.6 10*3/uL — ABNORMAL HIGH (ref 4.0–10.5)

## 2016-11-29 NOTE — Telephone Encounter (Signed)
User: Trina Ao A Date/time: 11/29/16 11:17 AM  Comment: Called pt and lmsg for him to CB to get scheduled for a myoview.   Context:  Outcome: Left Message  Phone number: [phone] Phone Type: Work Designer, industrial/product. type: Telephone Call type: Outgoing  Contact: Hollie Beach E Relation to patient: Self

## 2016-11-29 NOTE — Progress Notes (Signed)
Primary Care Physician: AronsoGeoffry ParadiseD Referring Physician:Dr. Johney Frame   Sean Bowman is a 83 y.o. male with a h/o persistent afib that was diagnosed in December of 2017. He was started on anticoagulation and weeks later had DCCV which was unsuccessful. At time of cardioversion Cardizem was doubled for better rate control and simvastatin was stopped due to drug drug interaction concerns. He was later started on rosuvastatin.   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 He continues on apixaban.   Echo showed EF of 40-45%. It was decided that he would benefit from restoring SR with tikosyn with probable TMC. He was admitted for Tikosyn initiation.He returned to SR and qtc was stable. He was eventually taken off Cardizem and some ankle edema he had improved and he only takes lasix as needed.  He asked to be seen today, 11/29/16,  for chest discomfort that he has had x 2 weeks now.  Has not noted any afib. Started out of the blue, he states that it is a 7/10, but he is able to work/sleep as usual. He worked in the yard this week end without any worsening of the pain or having to change his activities in any way. Also will feel an aching  up the back of his neck at times.Is not exertional but not positional either.EKG without any st/t wave changes.  The pain does not radiate, all central anterior. No nausea, dysphagia, heart burn, shortness of breath. No cough, fever chills. No diaphoresis, chest pain has not varied with intensity. Has c/o of increased urination since afib diagnosis but has not noted any odor, burning. Has felt some weakness in the knees. He is off all rate control 2/2 brady on return to SR. Cannot reproduce the  chest pain with palpation of chest wall. No aggravating/relieving factors. Has not tried Tylenol or ibuprofen fro the discomfort..  Today, he denies symptoms of palpitations,  shortness of  breath, orthopnea, PND  dizziness, presyncope, syncope, or neurologic sequela.  Positive for chest pain 7/10 with change in pain with exertion.The patient is tolerating medications without difficulties and is otherwise without complaint today.   Past Medical History:  Diagnosis Date  . Arthralgia   . Benign positional vertigo   . HTN (hypertension)   . Hyperlipidemia   . Impaired glucose tolerance   . Persistent atrial fibrillation (HCC)   . Senile purpura (HCC)    Past Surgical History:  Procedure Laterality Date  . CARDIOVERSION N/A 03/29/2016   Procedure: CARDIOVERSION;  Surgeon: Thurmon Fair, MD;  Location: [facility];  Service: Cardiovascular;  Laterality: N/A;  . CATARACT EXTRACTION W/ INTRAOCULAR LENS  IMPLANT, BILATERAL Bilateral 2017  . ORIF METACARPAL FRACTURE Left ~ 2016   S/P fall  . TONSILLECTOMY      Current Outpatient Prescriptions  Medication Sig Dispense Refill  . apixaban (ELIQUIS) 5 MG TABS tablet Take 1 tablet (5 mg total) by mouth 2 (two) times daily. 180 tablet 3  . dofetilide (TIKOSYN) 250 MCG capsule TAKE 1 CAPSULE (250 MCG TOTAL) BY MOUTH 2 (TWO) TIMES DAILY. 60 capsule 10  . rosuvastatin (CRESTOR) 20 MG tablet Take 20 mg by mouth daily.  5  . furosemide (LASIX) 20 MG tablet Take 1 tablet by mouth daily as needed for weight gain or swelling    . potassium chloride (K-DUR,KLOR-CON) 10 MEQ tablet Take 1 tablet (10 mEq total) by mouth daily as  needed (with Furosemide as needed for fluid). (Patient not taking: Reported on 11/29/2016) 30 tablet 3   No current facility-administered medications for this encounter.     No Known Allergies  Social History   Social History  . Marital status: Married    Spouse name: N/A  . Number of children: N/A  . Years of education: N/A   Occupational History  . Not on file.   Social History Main Topics  . Smoking status: Never Smoker  . Smokeless tobacco: Never Used  . Alcohol use 1.2 oz/week    2 Shots of liquor  per week  . Drug use: No  . Sexual activity: Not on file   Other Topics Concern  . 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    Family History  Problem Relation Age of Onset  . Stroke Mother   . Alzheimer's disease Mother   . Alzheimer's disease Father     ROS- All systems are reviewed and negative except as per the HPI above  Physical Exam: Vitals:   11/29/16 1032  BP: (!) 148/86  Pulse: 69  Weight: 181 lb 3.2 oz (82.2 kg)  Height:  (1.88 m)   Wt Readings from Last 3 Encounters:  11/29/16 181 lb 3.2 oz (82.2 kg)  09/06/16 176 lb 3.2 oz (79.9 kg)  08/06/16 180 lb 12.8 oz (82 kg)    Labs: Lab Results  Component Value Date   NA 138 11/29/2016   K 4.0 11/29/2016   CL 105 11/29/2016   CO2 27 11/29/2016   GLUCOSE 119 (H) 11/29/2016   BUN 16 11/29/2016   CREATININE 0.76 11/29/2016   CALCIUM 9.0 11/29/2016   MG 2.2 11/29/2016   No results found for: INR No results found for: CHOL, HDL, LDLCALC, TRIG   GEN- The patient is well appearing, alert and oriented x 3 today.   Head- normocephalic, atraumatic Eyes-  Sclera clear, conjunctiva pink Ears- hearing intact Oropharynx- clear Neck- supple, no JVP Lymph- no cervical lymphadenopathy Lungs- Clear to ausculation bilaterally, normal work of breathing. No tenderness of chest wall on palpation Heart- regular rate and rhythm, no murmurs, rubs or gallops, PMI not laterally displaced GI- soft, NT, ND, + BS Extremities- no clubbing, cyanosis, or  Mild edema around bilateral ankle area MS- no significant deformity or atrophy Skin- no rash or lesion Psych- euthymic mood, full affect Neuro- strength and sensation are intact  EKG-SR at 69 bpm, pr int 168 ms, qrs int 86 ms, qtc 452 ms  Epic records reviewed  Echo Study Conclusions  - Left ventricle: The cavity size was normal. Wall thickness  was   normal. Systolic function was mildly to moderately reduced. The   estimated ejection fraction was in the range of 40% to 45%. - Mitral valve: There was moderate regurgitation. - Left atrium: The atrium was mildly dilated. - Pulmonary arteries: Systolic pressure was mildly to moderately   increased. PA peak pressure: 39 mm Hg (S).    Assessment and Plan: 1. afib In SR with tikosyn Continue dofetilide 250 mcg a day Continue apixaban 5 mg bid Bmet/mag/cbc today  2. Chest discomfort Does not sound exertional but pt is concerned that it is cardiac in origin CXR today Can try tylenol to see if it helps ETT/myoview, pt would like to walk on the treadmill, if unable to hit target  may have to change to Lexi.  This can be coordinated with echo that is scheduled 10/1 and has f/u with Dr. Johney Frame 10/3.  Elvina Sidle [facility] [address] [place], [place] [address] [phone]

## 2016-12-01 ENCOUNTER — Telehealth (HOSPITAL_COMMUNITY): Payer: Self-pay | Admitting: *Deleted

## 2016-12-01 NOTE — Telephone Encounter (Signed)
Left message on voicemail per DPR in reference to upcoming appointment scheduled on 12/06/16 with detailed instructions given per Myocardial Perfusion Study Information Sheet for the test. LM to arrive 15 minutes early, and that it is imperative to arrive on time for appointment to keep from having the test rescheduled. If you need to cancel or reschedule your appointment, please call the office within 24 hours of your appointment. Failure to do so may result in a cancellation of your appointment, and a $50 no show fee. Phone number given for call back for any questions. Ricky Ala

## 2016-12-06 ENCOUNTER — Ambulatory Visit (HOSPITAL_BASED_OUTPATIENT_CLINIC_OR_DEPARTMENT_OTHER): Payer: Medicare Other

## 2016-12-06 ENCOUNTER — Ambulatory Visit (HOSPITAL_COMMUNITY): Payer: Medicare Other | Attending: Internal Medicine

## 2016-12-06 ENCOUNTER — Other Ambulatory Visit: Payer: Self-pay

## 2016-12-06 DIAGNOSIS — E785 Hyperlipidemia, unspecified: Secondary | ICD-10-CM | POA: Diagnosis not present

## 2016-12-06 DIAGNOSIS — I481 Persistent atrial fibrillation: Secondary | ICD-10-CM

## 2016-12-06 DIAGNOSIS — R079 Chest pain, unspecified: Secondary | ICD-10-CM | POA: Insufficient documentation

## 2016-12-06 DIAGNOSIS — I10 Essential (primary) hypertension: Secondary | ICD-10-CM | POA: Diagnosis not present

## 2016-12-06 DIAGNOSIS — I340 Nonrheumatic mitral (valve) insufficiency: Secondary | ICD-10-CM | POA: Insufficient documentation

## 2016-12-06 DIAGNOSIS — I4819 Other persistent atrial fibrillation: Secondary | ICD-10-CM

## 2016-12-06 LAB — MYOCARDIAL PERFUSION IMAGING
CHL CUP NUCLEAR SDS: 3
CHL CUP RESTING HR STRESS: 75 {beats}/min
CSEPED: 6 min
CSEPHR: 86 %
CSEPPHR: 115 {beats}/min
Estimated workload: 7 METS
LV dias vol: 128 mL (ref 62–150)
LVSYSVOL: 65 mL
MPHR: 133 {beats}/min
RATE: 0.28
RPE: 18
SRS: 2
SSS: 5
TID: 1.11

## 2016-12-06 LAB — ECHOCARDIOGRAM COMPLETE
Height: 74 in
Weight: 2816 oz

## 2016-12-06 MED ORDER — TECHNETIUM TC 99M TETROFOSMIN IV KIT
10.7000 | PACK | Freq: Once | INTRAVENOUS | Status: AC | PRN
  Administered 2016-12-06: 10.7 via INTRAVENOUS
  Filled 2016-12-06: qty 11

## 2016-12-06 MED ORDER — TECHNETIUM TC 99M TETROFOSMIN IV KIT
32.3000 | PACK | Freq: Once | INTRAVENOUS | Status: AC | PRN
  Administered 2016-12-06: 32.3 via INTRAVENOUS
  Filled 2016-12-06: qty 33

## 2016-12-08 ENCOUNTER — Ambulatory Visit (INDEPENDENT_AMBULATORY_CARE_PROVIDER_SITE_OTHER): Payer: Medicare Other | Admitting: Internal Medicine

## 2016-12-08 ENCOUNTER — Encounter: Payer: Self-pay | Admitting: Internal Medicine

## 2016-12-08 ENCOUNTER — Encounter (INDEPENDENT_AMBULATORY_CARE_PROVIDER_SITE_OTHER): Payer: Self-pay

## 2016-12-08 VITALS — BP 126/76 | HR 87 | Ht 72.0 in | Wt 179.0 lb

## 2016-12-08 DIAGNOSIS — I428 Other cardiomyopathies: Secondary | ICD-10-CM | POA: Diagnosis not present

## 2016-12-08 DIAGNOSIS — I481 Persistent atrial fibrillation: Secondary | ICD-10-CM | POA: Diagnosis not present

## 2016-12-08 DIAGNOSIS — I4819 Other persistent atrial fibrillation: Secondary | ICD-10-CM

## 2016-12-08 DIAGNOSIS — R0789 Other chest pain: Secondary | ICD-10-CM | POA: Diagnosis not present

## 2016-12-08 MED ORDER — METOPROLOL SUCCINATE ER 25 MG PO TB24: 50.0000 mg | ORAL_TABLET | Freq: Every day | ORAL | 3 refills | Status: DC

## 2016-12-08 NOTE — Patient Instructions (Addendum)
Medication Instructions:  Your physician has recommended you make the following change in your medication:  1.) Metoprolol Succinate 25mg  daily.    Labwork: None ordered   Testing/Procedures: None ordered   Follow-Up: Your physician recommends that you schedule a follow-up appointment in: 1 week for EKG in Afib clinic and 3 months with [name].   Any Other Special Instructions Will Be Listed Below (If Applicable).     If you need a refill on your cardiac medications before your next appointment, please call your pharmacy.

## 2016-12-08 NOTE — Progress Notes (Signed)
   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 male who presents today for routine electrophysiology followup.  Since last being seen in our clinic, the patient reports doing very well.  His atypical chest pain has resolved.  He is in afib today but unaware. Today, he denies symptoms of palpitations, exertional chest pain, shortness of breath,  lower extremity edema, dizziness, presyncope, or syncope.  The patient is otherwise without complaint today.   Past Medical History:  Diagnosis Date  . Arthralgia   . Benign positional vertigo   . HTN (hypertension)   . Hyperlipidemia   . Impaired glucose tolerance   . Persistent atrial fibrillation (HCC)   . Senile purpura (HCC)    Past Surgical History:  Procedure Laterality Date  . CARDIOVERSION N/A 03/29/2016   Procedure: CARDIOVERSION;  Surgeon: Thurmon Fair, MD;  Location: [facility];  Service: Cardiovascular;  Laterality: N/A;  . CATARACT EXTRACTION W/ INTRAOCULAR LENS  IMPLANT, BILATERAL Bilateral 2017  . ORIF METACARPAL FRACTURE Left ~ 2016   S/P fall  . TONSILLECTOMY      ROS- all systems are reviewed and negatives except as per HPI above  Current Outpatient Prescriptions  Medication Sig Dispense Refill  . apixaban (ELIQUIS) 5 MG TABS tablet Take 1 tablet (5 mg total) by mouth 2 (two) times daily. 180 tablet 3  . dofetilide (TIKOSYN) 250 MCG capsule TAKE 1 CAPSULE (250 MCG TOTAL) BY MOUTH 2 (TWO) TIMES DAILY. 60 capsule 10  . furosemide (LASIX) 20 MG tablet Take 1 tablet by mouth daily as needed for weight gain or swelling    . potassium chloride (K-DUR,KLOR-CON) 10 MEQ tablet Take 1 tablet (10 mEq total) by mouth daily as needed (with Furosemide as needed for fluid). 30 tablet 3  . rosuvastatin (CRESTOR) 20 MG tablet Take 20 mg by mouth daily.  5   No current facility-administered medications for this visit.     Physical Exam: Vitals:   12/08/16 1631  Weight: 179 lb (81.2 kg)  Height:  6' (1.829 m)    GEN- The patient is well appearing, alert and oriented x 3 today.   Head- normocephalic, atraumatic Eyes-  Sclera clear, conjunctiva pink Ears- hearing intact Oropharynx- clear Lungs- Clear to ausculation bilaterally, normal work of breathing Heart- irregular rate and rhythm, no murmurs, rubs or gallops, PMI not laterally displaced GI- soft, NT, ND, + BS Extremities- no clubbing, cyanosis, or edema  EKG tracing ordered today is personally reviewed and shows afib, V rate 125 bpm  Assessment and Plan:  1. Persistent afib Back in afib today Return for EKG in a week.  If still in afib, would advise cardioversion.  He prefers to go to afib clinic for ekg due to ease of parking. Continue anticoagulation Add metoprolol succinate 25mg  daily for rate control  2. Nonischemic CM Echo 12/06/16  Reveals that EF has recovered  3. Atypical chest pain Resolved Low risk myoview discussed with patient and wife at length today.  He prefers medical management Will continue statin Add metoprolol  Follow-up in AF clinic in 3 months if EKG reveals sinus rhythm next week  Hillis Range MD, [facility] 12/08/2016 4:38 PM

## 2016-12-10 ENCOUNTER — Telehealth: Payer: Self-pay | Admitting: Internal Medicine

## 2016-12-10 MED ORDER — METOPROLOL SUCCINATE ER 25 MG PO TB24: 50.0000 mg | ORAL_TABLET | Freq: Every day | ORAL | 3 refills | Status: DC

## 2016-12-10 MED ORDER — METOPROLOL SUCCINATE ER 25 MG PO TB24: 25.0000 mg | ORAL_TABLET | Freq: Every day | ORAL | 3 refills | Status: DC

## 2016-12-10 NOTE — Addendum Note (Signed)
Addended by: Demetrios Loll on: 12/10/2016 03:03 PM   Modules accepted: Orders

## 2016-12-10 NOTE — Telephone Encounter (Signed)
Pt's medication was resent to pt's pharmacy corrected, as requested. Confirmation received.

## 2016-12-10 NOTE — Telephone Encounter (Signed)
New Message  Todd from [facility] called to get clarity on pt medication metoprolol. He states pt is suppose to take 2 tablets instead of 1. Please call back to discuss

## 2016-12-13 DIAGNOSIS — I4891 Unspecified atrial fibrillation: Secondary | ICD-10-CM | POA: Diagnosis not present

## 2016-12-13 DIAGNOSIS — M791 Myalgia, unspecified site: Secondary | ICD-10-CM | POA: Diagnosis not present

## 2016-12-13 DIAGNOSIS — E7849 Other hyperlipidemia: Secondary | ICD-10-CM | POA: Diagnosis not present

## 2016-12-13 DIAGNOSIS — R7302 Impaired glucose tolerance (oral): Secondary | ICD-10-CM | POA: Diagnosis not present

## 2016-12-13 DIAGNOSIS — I10 Essential (primary) hypertension: Secondary | ICD-10-CM | POA: Diagnosis not present

## 2016-12-15 ENCOUNTER — Other Ambulatory Visit: Payer: Self-pay

## 2016-12-15 ENCOUNTER — Ambulatory Visit (HOSPITAL_COMMUNITY)
Admission: RE | Admit: 2016-12-15 | Discharge: 2016-12-15 | Disposition: A | Discharge status: Home / Self Care | Payer: Medicare Other | Source: Ambulatory Visit | Attending: Nurse Practitioner | Admitting: Nurse Practitioner

## 2016-12-15 DIAGNOSIS — I4891 Unspecified atrial fibrillation: Secondary | ICD-10-CM | POA: Insufficient documentation

## 2016-12-15 MED ORDER — METOPROLOL SUCCINATE ER 25 MG PO TB24: 12.5000 mg | ORAL_TABLET | Freq: Every day | ORAL | 3 refills | Status: DC

## 2016-12-15 NOTE — Patient Instructions (Signed)
Your physician has recommended you make the following change in your medication:  1)decrease metoprolol to 1/2 tablet daily (12.5mg )

## 2016-12-15 NOTE — Progress Notes (Addendum)
Pt in for EKG since starting metoprolol.  Pt EKG to be reviewed by [name], NP  Pt was found to be in afib on last clinic visit with Dr. Johney Frame. Toprol succinate 25 mg once a day was added and he was asked to come back in here for EKG f/u. Today he is in SR at 52 bpm, he has add brady with previous rate control. He is feeling well with HR of 52 bpm, but will reduce Toprol to 1/2 tab a day to allow HR to drift up hopefully to upper 50's.  He will f/u in 3 months in afib clinic.

## 2016-12-21 DIAGNOSIS — Z961 Presence of intraocular lens: Secondary | ICD-10-CM | POA: Diagnosis not present

## 2016-12-21 DIAGNOSIS — H401131 Primary open-angle glaucoma, bilateral, mild stage: Secondary | ICD-10-CM | POA: Diagnosis not present

## 2016-12-21 DIAGNOSIS — H16223 Keratoconjunctivitis sicca, not specified as Sjogren's, bilateral: Secondary | ICD-10-CM | POA: Diagnosis not present

## 2016-12-21 DIAGNOSIS — H18413 Arcus senilis, bilateral: Secondary | ICD-10-CM | POA: Diagnosis not present

## 2016-12-31 DIAGNOSIS — R82998 Other abnormal findings in urine: Secondary | ICD-10-CM | POA: Diagnosis not present

## 2016-12-31 DIAGNOSIS — M6281 Muscle weakness (generalized): Secondary | ICD-10-CM | POA: Diagnosis not present

## 2016-12-31 DIAGNOSIS — M7989 Other specified soft tissue disorders: Secondary | ICD-10-CM | POA: Diagnosis not present

## 2016-12-31 DIAGNOSIS — N390 Urinary tract infection, site not specified: Secondary | ICD-10-CM | POA: Diagnosis not present

## 2016-12-31 DIAGNOSIS — M791 Myalgia, unspecified site: Secondary | ICD-10-CM | POA: Diagnosis not present

## 2016-12-31 DIAGNOSIS — I4891 Unspecified atrial fibrillation: Secondary | ICD-10-CM | POA: Diagnosis not present

## 2016-12-31 DIAGNOSIS — R350 Frequency of micturition: Secondary | ICD-10-CM | POA: Diagnosis not present

## 2017-01-31 DIAGNOSIS — E7849 Other hyperlipidemia: Secondary | ICD-10-CM | POA: Diagnosis not present

## 2017-01-31 DIAGNOSIS — I10 Essential (primary) hypertension: Secondary | ICD-10-CM | POA: Diagnosis not present

## 2017-01-31 DIAGNOSIS — I4891 Unspecified atrial fibrillation: Secondary | ICD-10-CM | POA: Diagnosis not present

## 2017-01-31 DIAGNOSIS — R7302 Impaired glucose tolerance (oral): Secondary | ICD-10-CM | POA: Diagnosis not present

## 2017-02-07 DIAGNOSIS — M6281 Muscle weakness (generalized): Secondary | ICD-10-CM | POA: Diagnosis not present

## 2017-02-07 DIAGNOSIS — I4891 Unspecified atrial fibrillation: Secondary | ICD-10-CM | POA: Diagnosis not present

## 2017-02-07 DIAGNOSIS — M25511 Pain in right shoulder: Secondary | ICD-10-CM | POA: Diagnosis not present

## 2017-02-07 DIAGNOSIS — Z0000 Encounter for general adult medical examination without abnormal findings: Secondary | ICD-10-CM | POA: Diagnosis not present

## 2017-03-15 DIAGNOSIS — R5383 Other fatigue: Secondary | ICD-10-CM | POA: Diagnosis not present

## 2017-03-15 DIAGNOSIS — M2550 Pain in unspecified joint: Secondary | ICD-10-CM | POA: Diagnosis not present

## 2017-03-16 ENCOUNTER — Ambulatory Visit (HOSPITAL_COMMUNITY)
Admission: RE | Admit: 2017-03-16 | Discharge: 2017-03-16 | Disposition: A | Discharge status: Home / Self Care | Payer: Medicare Other | Source: Ambulatory Visit | Attending: Nurse Practitioner | Admitting: Nurse Practitioner

## 2017-03-16 ENCOUNTER — Encounter (HOSPITAL_COMMUNITY): Payer: Self-pay | Admitting: Nurse Practitioner

## 2017-03-16 VITALS — BP 140/72 | HR 52 | Ht 73.0 in | Wt 179.0 lb

## 2017-03-16 DIAGNOSIS — Z7901 Long term (current) use of anticoagulants: Secondary | ICD-10-CM | POA: Diagnosis not present

## 2017-03-16 DIAGNOSIS — I4819 Other persistent atrial fibrillation: Secondary | ICD-10-CM

## 2017-03-16 DIAGNOSIS — I4891 Unspecified atrial fibrillation: Secondary | ICD-10-CM | POA: Diagnosis present

## 2017-03-16 DIAGNOSIS — I10 Essential (primary) hypertension: Secondary | ICD-10-CM | POA: Insufficient documentation

## 2017-03-16 DIAGNOSIS — Z7952 Long term (current) use of systemic steroids: Secondary | ICD-10-CM | POA: Diagnosis not present

## 2017-03-16 DIAGNOSIS — Z79899 Other long term (current) drug therapy: Secondary | ICD-10-CM | POA: Diagnosis not present

## 2017-03-16 DIAGNOSIS — I481 Persistent atrial fibrillation: Secondary | ICD-10-CM | POA: Diagnosis not present

## 2017-03-16 DIAGNOSIS — E785 Hyperlipidemia, unspecified: Secondary | ICD-10-CM | POA: Diagnosis not present

## 2017-03-16 DIAGNOSIS — D692 Other nonthrombocytopenic purpura: Secondary | ICD-10-CM | POA: Diagnosis not present

## 2017-03-16 LAB — BASIC METABOLIC PANEL
ANION GAP: 8 (ref 5–15)
BUN: 16 mg/dL (ref 6–20)
CALCIUM: 9 mg/dL (ref 8.9–10.3)
CO2: 26 mmol/L (ref 22–32)
Chloride: 105 mmol/L (ref 101–111)
Creatinine, Ser: 0.91 mg/dL (ref 0.61–1.24)
Glucose, Bld: 110 mg/dL — ABNORMAL HIGH (ref 65–99)
Potassium: 4.3 mmol/L (ref 3.5–5.1)
SODIUM: 139 mmol/L (ref 135–145)

## 2017-03-16 LAB — MAGNESIUM: MAGNESIUM: 2.3 mg/dL (ref 1.7–2.4)

## 2017-03-16 NOTE — Progress Notes (Signed)
Primary Care Physician: Geoffry Paradise, MD Referring Physician:Dr. Johney Frame   Sean Bowman is a 84 y.o. male with a h/o persistent afib that was diagnosed in December of 2017. He was started on anticoagulation and weeks later had DCCV which was unsuccessful. At time of cardioversion Cardizem was doubled for better rate control and simvastatin was stopped due to drug drug interaction concerns. He was later started on rosuvastatin.  Echo showed EF of 40-45%. It was decided that he would benefit from restoring SR with tikosyn with probable TMC. He was admitted for Tikosyn initiation.He returned to SR and qtc was stable. He was eventually taken off Cardizem and some ankle edema he had improved and he only takes lasix as needed. Left on low dose BB. BB was held at one point, and had return of afib.  F/u in afib clinic, 1/9, for general  f/u of tikosyn. He is staying in SR. He has had all over joint discomfort including the chest, shoulders and back. He saw a specialist and is thought to have some sort of arthritis and is on prednisone which does help with symptoms. No bleeding issues with eliquis.  Today, he denies symptoms of palpitations,  shortness of breath, orthopnea, PND  dizziness, presyncope, syncope, or neurologic sequela.  Positive for chest pain 7/10 with change in pain with exertion.The patient is tolerating medications without difficulties and is otherwise without complaint today.   Past Medical History:  Diagnosis Date  . Arthralgia   . Benign positional vertigo   . HTN (hypertension)   . Hyperlipidemia   . Impaired glucose tolerance   . Persistent atrial fibrillation (HCC)   . Senile purpura (HCC)    Past Surgical History:  Procedure Laterality Date  . CARDIOVERSION N/A 03/29/2016   Procedure: CARDIOVERSION;  Surgeon: Thurmon Fair, MD;  Location: [facility];  Service: Cardiovascular;  Laterality: N/A;  . CATARACT EXTRACTION W/ INTRAOCULAR LENS  IMPLANT, BILATERAL Bilateral  2017  . ORIF METACARPAL FRACTURE Left ~ 2016   S/P fall  . TONSILLECTOMY      Current Outpatient Medications  Medication Sig Dispense Refill  . apixaban (ELIQUIS) 5 MG TABS tablet Take 1 tablet (5 mg total) by mouth 2 (two) times daily. 180 tablet 3  . dofetilide (TIKOSYN) 250 MCG capsule TAKE 1 CAPSULE (250 MCG TOTAL) BY MOUTH 2 (TWO) TIMES DAILY. 60 capsule 10  . furosemide (LASIX) 20 MG tablet Take 1 tablet by mouth daily as needed for weight gain or swelling    . metoprolol succinate (TOPROL-XL) 25 MG 24 hr tablet Take 0.5 tablets (12.5 mg total) by mouth daily. Take with or immediately following a meal. 90 tablet 3  . predniSONE (DELTASONE) 20 MG tablet Take 20 mg by mouth daily with breakfast.    . rosuvastatin (CRESTOR) 20 MG tablet Take 20 mg by mouth daily.  5  . potassium chloride (K-DUR,KLOR-CON) 10 MEQ tablet Take 1 tablet (10 mEq total) by mouth daily as needed (with Furosemide as needed for fluid). (Patient not taking: Reported on 03/16/2017) 30 tablet 3   No current facility-administered medications for this encounter.     No Known Allergies  Social History   Socioeconomic History  . Marital status: Married    Spouse name: Not on file  . Number of children: Not on file  . Years of education: Not on file  . Highest education level: Not on file  Social Needs  . Financial resource strain: Not on file  . Food insecurity -  worry: Not on file  . Food insecurity - inability: Not on file  . Transportation needs - medical: Not on file  . Transportation needs - non-medical: Not on file  Occupational History  . Not on file  Tobacco Use  . Smoking status: Never Smoker  . Smokeless tobacco: Never Used  Substance and Sexual Activity  . Alcohol use: Yes    Alcohol/week: 1.2 oz    Types: 2 Shots of liquor per week  . Drug use: No  . Sexual activity: Not on file  Other Topics Concern  . 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    Family History  Problem Relation Age of Onset  . Stroke Mother   . Alzheimer's disease Mother   . Alzheimer's disease Father     ROS- All systems are reviewed and negative except as per the HPI above  Physical Exam: Vitals:   03/16/17 0841  BP: 140/72  Pulse: (!) 52  Weight: 179 lb (81.2 kg)  Height: 6\' 1"  (1.854 m)   Wt Readings from Last 3 Encounters:  03/16/17 179 lb (81.2 kg)  12/08/16 179 lb (81.2 kg)  12/06/16 176 lb (79.8 kg)    Labs: Lab Results  Component Value Date   NA 138 11/29/2016   K 4.0 11/29/2016   CL 105 11/29/2016   CO2 27 11/29/2016   GLUCOSE 119 (H) 11/29/2016   BUN 16 11/29/2016   CREATININE 0.76 11/29/2016   CALCIUM 9.0 11/29/2016   MG 2.2 11/29/2016   No results found for: INR No results found for: CHOL, HDL, LDLCALC, TRIG   GEN- The patient is well appearing, alert and oriented x 3 today.   Head- normocephalic, atraumatic Eyes-  Sclera clear, conjunctiva pink Ears- hearing intact Oropharynx- clear Neck- supple, no JVP Lymph- no cervical lymphadenopathy Lungs- Clear to ausculation bilaterally, normal work of breathing. No tenderness of chest wall on palpation Heart- regular rate and rhythm, no murmurs, rubs or gallops, PMI not laterally displaced GI- soft, NT, ND, + BS Extremities- no clubbing, cyanosis, or  Mild edema around bilateral ankle area MS- no significant deformity or atrophy Skin- no rash or lesion Psych- euthymic mood, full affect Neuro- strength and sensation are intact  EKG-S brady at 52 bpm, pr int 164 ms, qrs int 82 ms, qtc 442 ms  Epic records reviewed  Echo Study Conclusions  - Left ventricle: The cavity size was normal. Wall thickness was   normal. Systolic function was mildly to moderately reduced. The   estimated ejection fraction was in the range of 40% to 45%. - Mitral valve: There was moderate regurgitation. - Left atrium:  The atrium was mildly dilated. - Pulmonary arteries: Systolic pressure was mildly to moderately   increased. PA peak pressure: 39 mm Hg (S).    Assessment and Plan: 1. afib In SR with tikosyn Continue dofetilide 250 mcg a day Continue apixaban 5 mg bid for chadsvasc score of 3-4 Bmet/mag today  2. Chest/joint discomfort Being evaluated by an rheumatologist Currently on daily prednisone which is helping pain  F/u in 3 months for Tikosyn surveillnace  Alivya Wegman C. [facility] [address] [place], [place] [address] [phone]

## 2017-04-06 ENCOUNTER — Other Ambulatory Visit: Payer: Self-pay | Admitting: Dermatology

## 2017-04-06 DIAGNOSIS — D229 Melanocytic nevi, unspecified: Secondary | ICD-10-CM | POA: Diagnosis not present

## 2017-04-06 DIAGNOSIS — L821 Other seborrheic keratosis: Secondary | ICD-10-CM | POA: Diagnosis not present

## 2017-04-06 DIAGNOSIS — D0359 Melanoma in situ of other part of trunk: Secondary | ICD-10-CM | POA: Diagnosis not present

## 2017-04-06 DIAGNOSIS — L570 Actinic keratosis: Secondary | ICD-10-CM | POA: Diagnosis not present

## 2017-04-06 DIAGNOSIS — C4359 Malignant melanoma of other part of trunk: Secondary | ICD-10-CM | POA: Diagnosis not present

## 2017-04-13 DIAGNOSIS — Z7952 Long term (current) use of systemic steroids: Secondary | ICD-10-CM | POA: Diagnosis not present

## 2017-04-13 DIAGNOSIS — M2550 Pain in unspecified joint: Secondary | ICD-10-CM | POA: Diagnosis not present

## 2017-04-13 DIAGNOSIS — M353 Polymyalgia rheumatica: Secondary | ICD-10-CM | POA: Diagnosis not present

## 2017-05-10 DIAGNOSIS — L905 Scar conditions and fibrosis of skin: Secondary | ICD-10-CM | POA: Diagnosis not present

## 2017-05-10 DIAGNOSIS — D0359 Melanoma in situ of other part of trunk: Secondary | ICD-10-CM | POA: Diagnosis not present

## 2017-05-26 DIAGNOSIS — M353 Polymyalgia rheumatica: Secondary | ICD-10-CM | POA: Diagnosis not present

## 2017-05-26 DIAGNOSIS — M2550 Pain in unspecified joint: Secondary | ICD-10-CM | POA: Diagnosis not present

## 2017-05-26 DIAGNOSIS — Z7952 Long term (current) use of systemic steroids: Secondary | ICD-10-CM | POA: Diagnosis not present

## 2017-05-26 DIAGNOSIS — Z6823 Body mass index (BMI) 23.0-23.9, adult: Secondary | ICD-10-CM | POA: Diagnosis not present

## 2017-06-15 ENCOUNTER — Ambulatory Visit (HOSPITAL_COMMUNITY): Payer: Medicare Other | Admitting: Nurse Practitioner

## 2017-06-27 ENCOUNTER — Ambulatory Visit (HOSPITAL_COMMUNITY)
Admission: RE | Admit: 2017-06-27 | Discharge: 2017-06-27 | Disposition: A | Discharge status: Home / Self Care | Payer: Medicare Other | Source: Ambulatory Visit | Attending: Nurse Practitioner | Admitting: Nurse Practitioner

## 2017-06-27 ENCOUNTER — Encounter (HOSPITAL_COMMUNITY): Payer: Self-pay | Admitting: Nurse Practitioner

## 2017-06-27 VITALS — BP 154/82 | HR 46 | Ht 73.0 in | Wt 183.8 lb

## 2017-06-27 DIAGNOSIS — Z7901 Long term (current) use of anticoagulants: Secondary | ICD-10-CM | POA: Insufficient documentation

## 2017-06-27 DIAGNOSIS — E785 Hyperlipidemia, unspecified: Secondary | ICD-10-CM | POA: Diagnosis not present

## 2017-06-27 DIAGNOSIS — Z79899 Other long term (current) drug therapy: Secondary | ICD-10-CM | POA: Diagnosis not present

## 2017-06-27 DIAGNOSIS — I10 Essential (primary) hypertension: Secondary | ICD-10-CM | POA: Diagnosis not present

## 2017-06-27 DIAGNOSIS — H8110 Benign paroxysmal vertigo, unspecified ear: Secondary | ICD-10-CM | POA: Diagnosis not present

## 2017-06-27 DIAGNOSIS — R001 Bradycardia, unspecified: Secondary | ICD-10-CM | POA: Diagnosis not present

## 2017-06-27 DIAGNOSIS — I481 Persistent atrial fibrillation: Secondary | ICD-10-CM | POA: Diagnosis not present

## 2017-06-27 DIAGNOSIS — M7989 Other specified soft tissue disorders: Secondary | ICD-10-CM | POA: Diagnosis not present

## 2017-06-27 DIAGNOSIS — I4819 Other persistent atrial fibrillation: Secondary | ICD-10-CM

## 2017-06-27 DIAGNOSIS — D692 Other nonthrombocytopenic purpura: Secondary | ICD-10-CM | POA: Insufficient documentation

## 2017-06-27 LAB — BASIC METABOLIC PANEL
Anion gap: 10 (ref 5–15)
BUN: 17 mg/dL (ref 6–20)
CHLORIDE: 103 mmol/L (ref 101–111)
CO2: 25 mmol/L (ref 22–32)
CREATININE: 0.99 mg/dL (ref 0.61–1.24)
Calcium: 8.9 mg/dL (ref 8.9–10.3)
GFR calc Af Amer: >60 mL/min (ref >60)
GFR calc non Af Amer: >60 mL/min (ref >60)
GLUCOSE: 173 mg/dL — AB (ref 65–99)
POTASSIUM: 4 mmol/L (ref 3.5–5.1)
Sodium: 138 mmol/L (ref 135–145)

## 2017-06-27 LAB — MAGNESIUM: Magnesium: 2.1 mg/dL (ref 1.7–2.4)

## 2017-06-27 NOTE — Progress Notes (Signed)
Primary Care Physician: Geoffry Paradise, MD Referring Physician:Dr. Johney Frame   Sean Bowman is a 84 y.o. male with a h/o persistent afib that was diagnosed in December of 2017. He was started on anticoagulation and weeks later had DCCV which was unsuccessful. At time of cardioversion Cardizem was doubled for better rate control and simvastatin was stopped due to drug drug interaction concerns. He was later started on rosuvastatin.  Echo showed EF of 40-45%. It was decided that he would benefit from restoring SR with tikosyn with probable TMC. He was admitted for Tikosyn initiation.He returned to SR and qtc was stable. He was eventually taken off Cardizem and some ankle edema he had improved and he only takes lasix as needed. Left on low dose BB. BB was held at one point, and had return of afib.  F/u in afib clinic, 03/16/17, for general  f/u of tikosyn. He is staying in SR. He has had all over joint discomfort including the chest, shoulders and back. He saw a specialist and is thought to have some sort of arthritis and is on prednisone which does help with symptoms. No bleeding issues with eliquis.  F/u in afib clinic 4/22,he is doing well staying in SR with Tikosyn.  He has ha HR in the 40's, which is chronic but is not symptomatic. 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 No bleeding issues with eliquis 5 mg bid.  Today, he denies symptoms of palpitations,  shortness of breath, orthopnea, PND  dizziness, presyncope, syncope, or neurologic sequela. The patient is tolerating medications without difficulties and is otherwise without complaint today.   Past Medical History:  Diagnosis Date  . Arthralgia   . Benign positional vertigo   . HTN (hypertension)   . Hyperlipidemia   . Impaired glucose tolerance   . Persistent atrial fibrillation (HCC)   . Senile purpura (HCC)    Past Surgical History:  Procedure Laterality Date  . CARDIOVERSION N/A 03/29/2016   Procedure:  CARDIOVERSION;  Surgeon: Thurmon Fair, MD;  Location: [facility];  Service: Cardiovascular;  Laterality: N/A;  . CATARACT EXTRACTION W/ INTRAOCULAR LENS  IMPLANT, BILATERAL Bilateral 2017  . ORIF METACARPAL FRACTURE Left ~ 2016   S/P fall  . TONSILLECTOMY      Current Outpatient Medications  Medication Sig Dispense Refill  . apixaban (ELIQUIS) 5 MG TABS tablet Take 1 tablet (5 mg total) by mouth 2 (two) times daily. 180 tablet 3  . dofetilide (TIKOSYN) 250 MCG capsule TAKE 1 CAPSULE (250 MCG TOTAL) BY MOUTH 2 (TWO) TIMES DAILY. 60 capsule 10  . furosemide (LASIX) 20 MG tablet Take 1 tablet by mouth daily as needed for weight gain or swelling    . metoprolol succinate (TOPROL-XL) 25 MG 24 hr tablet Take 0.5 tablets (12.5 mg total) by mouth daily. Take with or immediately following a meal. 90 tablet 3  . potassium chloride (K-DUR,KLOR-CON) 10 MEQ tablet Take 1 tablet (10 mEq total) by mouth daily as needed (with Furosemide as needed for fluid). 30 tablet 3  . predniSONE (DELTASONE) 20 MG tablet Take 20 mg by mouth daily with breakfast.    . rosuvastatin (CRESTOR) 20 MG tablet Take 20 mg by mouth daily.  5   No current facility-administered medications for this encounter.     No Known Allergies  Social History   Socioeconomic History  . Marital status: Married    Spouse name: Not on file  . Number of children: Not on file  .  Years of education: Not on file  . Highest education level: Not on file  Occupational History  . Not on file  Social Needs  . Financial resource strain: Not on file  . Food insecurity:    Worry: Not on file    Inability: Not on file  . Transportation needs:    Medical: Not on file    Non-medical: Not on file  Tobacco Use  . Smoking status: Never Smoker  . Smokeless tobacco: Never Used  Substance and Sexual Activity  . Alcohol use: Yes    Alcohol/week: 1.2 oz    Types: 2 Shots of liquor per week  . Drug use: No  . Sexual activity: Not on file    Lifestyle  . Physical activity:    Days per week: Not on file    Minutes per session: Not on file  . Stress: Not on file  Relationships  . Social connections:    Talks on phone: Not on file    Gets together: Not on file    Attends religious service: Not on file    Active member of club or organization: Not on file    Attends meetings of clubs or organizations: Not on file    Relationship status: Not on file  . Intimate partner violence:    Fear of current or ex partner: Not on file    Emotionally abused: Not on file    Physically abused: Not on file    Forced sexual activity: Not on file  Other Topics Concern  . 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    Family History  Problem Relation Age of Onset  . Stroke Mother   . Alzheimer's disease Mother   . Alzheimer's disease Father     ROS- All systems are reviewed and negative except as per the HPI above  Physical Exam: Vitals:   06/27/17 1123  BP: (!) 154/82  Pulse: (!) 46  Weight: 183 lb 12.8 oz (83.4 kg)  Height: 6\' 1"  (1.854 m)   Wt Readings from Last 3 Encounters:  06/27/17 183 lb 12.8 oz (83.4 kg)  03/16/17 179 lb (81.2 kg)  12/08/16 179 lb (81.2 kg)    Labs: Lab Results  Component Value Date   NA 138 06/27/2017   K 4.0 06/27/2017   CL 103 06/27/2017   CO2 25 06/27/2017   GLUCOSE 173 (H) 06/27/2017   BUN 17 06/27/2017   CREATININE 0.99 06/27/2017   CALCIUM 8.9 06/27/2017   MG 2.1 06/27/2017   No results found for: INR No results found for: CHOL, HDL, LDLCALC, TRIG   GEN- The patient is well appearing, alert and oriented x 3 today.   Head- normocephalic, atraumatic Eyes-  Sclera clear, conjunctiva pink Ears- hearing intact Oropharynx- clear Neck- supple, no JVP Lymph- no cervical lymphadenopathy Lungs- Clear to ausculation bilaterally, normal work of breathing. No tenderness  of chest wall on palpation Heart- regular rate and rhythm, no murmurs, rubs or gallops, PMI not laterally displaced GI- soft, NT, ND, + BS Extremities- no clubbing, cyanosis, or  Mild edema around bilateral ankle area MS- no significant deformity or atrophy Skin- no rash or lesion Psych- euthymic mood, full affect Neuro- strength and sensation are intact  EKG-S brady at 46 bpm, pr int 180 ms, qrs int 98 ms, qtc 442 ms  Epic records reviewed  Echo  Study Conclusions  - Left ventricle: The cavity size was normal. Wall thickness was   normal. Systolic function was mildly to moderately reduced. The   estimated ejection fraction was in the range of 40% to 45%. - Mitral valve: There was moderate regurgitation. - Left atrium: The atrium was mildly dilated. - Pulmonary arteries: Systolic pressure was mildly to moderately   increased. PA peak pressure: 39 mm Hg (S).    Assessment and Plan: 1. afib In SR with tikosyn Continue dofetilide 250 mcg a day Continue apixaban 5 mg bid for chadsvasc score of 3-4, still appropriately dosed with cr at 0.99 and weight at 183 lbs. Bmet/mag today with K+ at 4.0 and mag at 2.1  2. Brady Asymptomatic  Continue to monitor  F/u in 4 months for [employer] C. Matthew Folks [facility] [address] [place], [place] [address] [phone]

## 2017-08-02 ENCOUNTER — Other Ambulatory Visit: Payer: Self-pay | Admitting: Internal Medicine

## 2017-08-02 DIAGNOSIS — Z961 Presence of intraocular lens: Secondary | ICD-10-CM | POA: Diagnosis not present

## 2017-08-02 DIAGNOSIS — H16223 Keratoconjunctivitis sicca, not specified as Sjogren's, bilateral: Secondary | ICD-10-CM | POA: Diagnosis not present

## 2017-08-02 DIAGNOSIS — H401131 Primary open-angle glaucoma, bilateral, mild stage: Secondary | ICD-10-CM | POA: Diagnosis not present

## 2017-08-02 DIAGNOSIS — H18413 Arcus senilis, bilateral: Secondary | ICD-10-CM | POA: Diagnosis not present

## 2017-08-11 DIAGNOSIS — I10 Essential (primary) hypertension: Secondary | ICD-10-CM | POA: Diagnosis not present

## 2017-08-11 DIAGNOSIS — R7302 Impaired glucose tolerance (oral): Secondary | ICD-10-CM | POA: Diagnosis not present

## 2017-08-11 DIAGNOSIS — E7849 Other hyperlipidemia: Secondary | ICD-10-CM | POA: Diagnosis not present

## 2017-08-11 DIAGNOSIS — M6281 Muscle weakness (generalized): Secondary | ICD-10-CM | POA: Diagnosis not present

## 2017-09-16 DIAGNOSIS — M79644 Pain in right finger(s): Secondary | ICD-10-CM | POA: Diagnosis not present

## 2017-09-19 DIAGNOSIS — Z7952 Long term (current) use of systemic steroids: Secondary | ICD-10-CM | POA: Diagnosis not present

## 2017-09-19 DIAGNOSIS — M353 Polymyalgia rheumatica: Secondary | ICD-10-CM | POA: Diagnosis not present

## 2017-09-19 DIAGNOSIS — M2550 Pain in unspecified joint: Secondary | ICD-10-CM | POA: Diagnosis not present

## 2017-09-19 DIAGNOSIS — Z6823 Body mass index (BMI) 23.0-23.9, adult: Secondary | ICD-10-CM | POA: Diagnosis not present

## 2017-10-19 ENCOUNTER — Ambulatory Visit (HOSPITAL_COMMUNITY): Payer: Medicare Other | Admitting: Nurse Practitioner

## 2017-10-31 ENCOUNTER — Ambulatory Visit (HOSPITAL_COMMUNITY): Payer: Medicare Other | Admitting: Nurse Practitioner

## 2017-11-02 ENCOUNTER — Ambulatory Visit (HOSPITAL_COMMUNITY)
Admission: RE | Admit: 2017-11-02 | Discharge: 2017-11-02 | Disposition: A | Discharge status: Home / Self Care | Payer: Medicare Other | Source: Ambulatory Visit | Attending: Nurse Practitioner | Admitting: Nurse Practitioner

## 2017-11-02 ENCOUNTER — Encounter (HOSPITAL_COMMUNITY): Payer: Self-pay | Admitting: Nurse Practitioner

## 2017-11-02 VITALS — BP 158/64 | HR 49 | Ht 73.0 in | Wt 182.0 lb

## 2017-11-02 DIAGNOSIS — R001 Bradycardia, unspecified: Secondary | ICD-10-CM | POA: Insufficient documentation

## 2017-11-02 DIAGNOSIS — E785 Hyperlipidemia, unspecified: Secondary | ICD-10-CM | POA: Diagnosis not present

## 2017-11-02 DIAGNOSIS — I481 Persistent atrial fibrillation: Secondary | ICD-10-CM

## 2017-11-02 DIAGNOSIS — I10 Essential (primary) hypertension: Secondary | ICD-10-CM | POA: Insufficient documentation

## 2017-11-02 DIAGNOSIS — Z79899 Other long term (current) drug therapy: Secondary | ICD-10-CM | POA: Diagnosis not present

## 2017-11-02 DIAGNOSIS — Z823 Family history of stroke: Secondary | ICD-10-CM | POA: Diagnosis not present

## 2017-11-02 DIAGNOSIS — Z9889 Other specified postprocedural states: Secondary | ICD-10-CM | POA: Diagnosis not present

## 2017-11-02 DIAGNOSIS — Z818 Family history of other mental and behavioral disorders: Secondary | ICD-10-CM | POA: Diagnosis not present

## 2017-11-02 DIAGNOSIS — I4819 Other persistent atrial fibrillation: Secondary | ICD-10-CM

## 2017-11-02 DIAGNOSIS — Z7901 Long term (current) use of anticoagulants: Secondary | ICD-10-CM | POA: Diagnosis not present

## 2017-11-02 LAB — MAGNESIUM: MAGNESIUM: 2.4 mg/dL (ref 1.7–2.4)

## 2017-11-02 LAB — BASIC METABOLIC PANEL
Anion gap: 7 (ref 5–15)
BUN: 15 mg/dL (ref 8–23)
CO2: 28 mmol/L (ref 22–32)
CREATININE: 1.07 mg/dL (ref 0.61–1.24)
Calcium: 9 mg/dL (ref 8.9–10.3)
Chloride: 106 mmol/L (ref 98–111)
GFR calc Af Amer: >60 mL/min (ref >60)
GFR, EST NON AFRICAN AMERICAN: 60 mL/min — AB (ref >60)
GLUCOSE: 173 mg/dL — AB (ref 70–99)
Potassium: 3.9 mmol/L (ref 3.5–5.1)
SODIUM: 141 mmol/L (ref 135–145)

## 2017-11-02 NOTE — Progress Notes (Signed)
Primary Care Physician: Geoffry Paradise, MD Referring Physician:Dr. Johney Frame   Sean Bowman is a 84 y.o. male with a h/o persistent afib that was diagnosed in December of 2017. He was started on anticoagulation and weeks later had DCCV which was unsuccessful. At time of cardioversion Cardizem was doubled for better rate control and simvastatin was stopped due to drug drug interaction concerns. He was later started on rosuvastatin.  Echo showed EF of 40-45%. It was decided that he would benefit from restoring SR with tikosyn with probable TMC. He was admitted for Tikosyn initiation.He returned to SR and qtc was stable. He was eventually taken off Cardizem and some ankle edema he had improved and he only takes lasix as needed. Left on low dose BB. BB was held at one point, and had return of afib.  F/u in afib clinic, 03/16/17, for general  f/u of tikosyn. He is staying in SR. He has had all over joint discomfort including the chest, shoulders and back. He saw a specialist and is thought to have some sort of arthritis and is on prednisone which does help with symptoms. No bleeding issues with eliquis.  F/u in afib clinic 4/22,he is doing well staying in SR with Tikosyn.  He has a HR in the 40's, which is chronic but is not symptomatic. 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 No bleeding issues with eliquis 5 mg bid.  F/u in afib clinic 8/28. He feels well. He again has a HR in the upper 40's but is asymptomatic. He is taking eliquis on a regular basis. Being compliant with dofetilide as well.  Today, he denies symptoms of palpitations,  shortness of breath, orthopnea, PND  dizziness, presyncope, syncope, or neurologic sequela. The patient is tolerating medications without difficulties and is otherwise without complaint today.   Past Medical History:  Diagnosis Date  . Arthralgia   . Benign positional vertigo   . HTN (hypertension)   . Hyperlipidemia   . Impaired glucose tolerance    . Persistent atrial fibrillation (HCC)   . Senile purpura (HCC)    Past Surgical History:  Procedure Laterality Date  . CARDIOVERSION N/A 03/29/2016   Procedure: CARDIOVERSION;  Surgeon: Thurmon Fair, MD;  Location: [facility];  Service: Cardiovascular;  Laterality: N/A;  . CATARACT EXTRACTION W/ INTRAOCULAR LENS  IMPLANT, BILATERAL Bilateral 2017  . ORIF METACARPAL FRACTURE Left ~ 2016   S/P fall  . TONSILLECTOMY      Current Outpatient Medications  Medication Sig Dispense Refill  . apixaban (ELIQUIS) 5 MG TABS tablet Take 1 tablet (5 mg total) by mouth 2 (two) times daily. 180 tablet 3  . dofetilide (TIKOSYN) 250 MCG capsule TAKE 1 CAPSULE (250 MCG TOTAL) BY MOUTH 2 (TWO) TIMES DAILY. 60 capsule 4  . furosemide (LASIX) 20 MG tablet Take 1 tablet by mouth daily as needed for weight gain or swelling    . metoprolol succinate (TOPROL-XL) 25 MG 24 hr tablet Take 0.5 tablets (12.5 mg total) by mouth daily. Take with or immediately following a meal. 90 tablet 3  . potassium chloride (K-DUR,KLOR-CON) 10 MEQ tablet Take 1 tablet (10 mEq total) by mouth daily as needed (with Furosemide as needed for fluid). 30 tablet 3  . predniSONE (DELTASONE) 20 MG tablet Take 10 mg by mouth daily with breakfast. Prednisone 10    . rosuvastatin (CRESTOR) 20 MG tablet Take 20 mg by mouth daily.  5   No current facility-administered medications for this  encounter.     No Known Allergies  Social History   Socioeconomic History  . Marital status: Married    Spouse name: Not on file  . Number of children: Not on file  . Years of education: Not on file  . Highest education level: Not on file  Occupational History  . Not on file  Social Needs  . Financial resource strain: Not on file  . Food insecurity:    Worry: Not on file    Inability: Not on file  . Transportation needs:    Medical: Not on file    Non-medical: Not on file  Tobacco Use  . Smoking status: Never Smoker  . Smokeless tobacco:  Never Used  Substance and Sexual Activity  . Alcohol use: Yes    Alcohol/week: 2.0 standard drinks    Types: 2 Shots of liquor per week  . Drug use: No  . Sexual activity: Not on file  Lifestyle  . Physical activity:    Days per week: Not on file    Minutes per session: Not on file  . Stress: Not on file  Relationships  . Social connections:    Talks on phone: Not on file    Gets together: Not on file    Attends religious service: Not on file    Active member of club or organization: Not on file    Attends meetings of clubs or organizations: Not on file    Relationship status: Not on file  . Intimate partner violence:    Fear of current or ex partner: Not on file    Emotionally abused: Not on file    Physically abused: Not on file    Forced sexual activity: Not on file  Other Topics Concern  . 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    Family History  Problem Relation Age of Onset  . Stroke Mother   . Alzheimer's disease Mother   . Alzheimer's disease Father     ROS- All systems are reviewed and negative except as per the HPI above  Physical Exam: Vitals:   11/02/17 1354  BP: (!) 158/64  Pulse: (!) 49  Weight: 82.6 kg  Height: 6\' 1"  (1.854 m)   Wt Readings from Last 3 Encounters:  11/02/17 82.6 kg  06/27/17 83.4 kg  03/16/17 81.2 kg    Labs: Lab Results  Component Value Date   NA 138 06/27/2017   K 4.0 06/27/2017   CL 103 06/27/2017   CO2 25 06/27/2017   GLUCOSE 173 (H) 06/27/2017   BUN 17 06/27/2017   CREATININE 0.99 06/27/2017   CALCIUM 8.9 06/27/2017   MG 2.1 06/27/2017   No results found for: INR No results found for: CHOL, HDL, LDLCALC, TRIG   GEN- The patient is well appearing, alert and oriented x 3 today.   Head- normocephalic, atraumatic Eyes-  Sclera clear, conjunctiva pink Ears- hearing intact Oropharynx-  clear Neck- supple, no JVP Lymph- no cervical lymphadenopathy Lungs- Clear to ausculation bilaterally, normal work of breathing. No tenderness of chest wall on palpation Heart- regular rate and rhythm, no murmurs, rubs or gallops, PMI not laterally displaced GI- soft, NT, ND, + BS Extremities- no clubbing, cyanosis, or  Mild edema around bilateral ankle area MS- no significant deformity or atrophy Skin- no rash or lesion Psych- euthymic mood, full affect Neuro- strength and  sensation are intact  EKG-S brady at 49 bpm, pr int 176 ms, qrs int 90 ms, qtc 437 ms  Epic records reviewed  Echo Study Conclusions  - Left ventricle: The cavity size was normal. Wall thickness was   normal. Systolic function was mildly to moderately reduced. The   estimated ejection fraction was in the range of 40% to 45%. - Mitral valve: There was moderate regurgitation. - Left atrium: The atrium was mildly dilated. - Pulmonary arteries: Systolic pressure was mildly to moderately   increased. PA peak pressure: 39 mm Hg (S).    Assessment and Plan: 1. afib In SR with tikosyn Continue dofetilide 250 mcg a day Continue apixaban 5 mg bid for chadsvasc score of 3-4  Bmet/mag today   2. Brady Asymptomatic  Continue to monitor  F/u in 6 months for [employer] C. Matthew Folks [facility] [address] [place], [place] [address] [phone]

## 2017-12-20 ENCOUNTER — Telehealth (HOSPITAL_COMMUNITY): Payer: Self-pay | Admitting: *Deleted

## 2017-12-20 NOTE — Telephone Encounter (Signed)
Patient called in stating his wife fell and broke her leg and is in the hospital. She usually fixes his pill box - he discovered he had missed all of his night time medication for 3 days (including tikosyn and eliquis). Pt is having pill box managed by other family member now and is taking medications correctly. Pt reminded of importance of not missing doses of tikosyn. Pt verbalized understanding.

## 2017-12-22 DIAGNOSIS — Z6823 Body mass index (BMI) 23.0-23.9, adult: Secondary | ICD-10-CM | POA: Diagnosis not present

## 2017-12-22 DIAGNOSIS — M2550 Pain in unspecified joint: Secondary | ICD-10-CM | POA: Diagnosis not present

## 2017-12-22 DIAGNOSIS — M353 Polymyalgia rheumatica: Secondary | ICD-10-CM | POA: Diagnosis not present

## 2017-12-22 DIAGNOSIS — Z7952 Long term (current) use of systemic steroids: Secondary | ICD-10-CM | POA: Diagnosis not present

## 2017-12-27 ENCOUNTER — Other Ambulatory Visit: Payer: Self-pay | Admitting: Dermatology

## 2017-12-27 DIAGNOSIS — L820 Inflamed seborrheic keratosis: Secondary | ICD-10-CM | POA: Diagnosis not present

## 2017-12-27 DIAGNOSIS — Z85820 Personal history of malignant melanoma of skin: Secondary | ICD-10-CM | POA: Diagnosis not present

## 2017-12-27 DIAGNOSIS — L821 Other seborrheic keratosis: Secondary | ICD-10-CM | POA: Diagnosis not present

## 2017-12-27 DIAGNOSIS — D485 Neoplasm of uncertain behavior of skin: Secondary | ICD-10-CM | POA: Diagnosis not present

## 2017-12-27 DIAGNOSIS — D229 Melanocytic nevi, unspecified: Secondary | ICD-10-CM | POA: Diagnosis not present

## 2018-01-10 ENCOUNTER — Other Ambulatory Visit: Payer: Self-pay | Admitting: Internal Medicine

## 2018-01-22 ENCOUNTER — Other Ambulatory Visit: Payer: Self-pay | Admitting: Internal Medicine

## 2018-02-16 DIAGNOSIS — H401131 Primary open-angle glaucoma, bilateral, mild stage: Secondary | ICD-10-CM | POA: Diagnosis not present

## 2018-02-16 DIAGNOSIS — H18413 Arcus senilis, bilateral: Secondary | ICD-10-CM | POA: Diagnosis not present

## 2018-02-16 DIAGNOSIS — Z961 Presence of intraocular lens: Secondary | ICD-10-CM | POA: Diagnosis not present

## 2018-02-16 DIAGNOSIS — H04123 Dry eye syndrome of bilateral lacrimal glands: Secondary | ICD-10-CM | POA: Diagnosis not present

## 2018-03-21 ENCOUNTER — Encounter (HOSPITAL_COMMUNITY): Payer: Self-pay | Admitting: Nurse Practitioner

## 2018-03-21 ENCOUNTER — Ambulatory Visit (HOSPITAL_COMMUNITY)
Admission: RE | Admit: 2018-03-21 | Discharge: 2018-03-21 | Disposition: A | Discharge status: Home / Self Care | Payer: Medicare Other | Source: Ambulatory Visit | Attending: Nurse Practitioner | Admitting: Nurse Practitioner

## 2018-03-21 VITALS — BP 132/64 | HR 48 | Ht 73.0 in | Wt 185.0 lb

## 2018-03-21 DIAGNOSIS — I4819 Other persistent atrial fibrillation: Secondary | ICD-10-CM | POA: Insufficient documentation

## 2018-03-21 DIAGNOSIS — D692 Other nonthrombocytopenic purpura: Secondary | ICD-10-CM | POA: Insufficient documentation

## 2018-03-21 DIAGNOSIS — Z79899 Other long term (current) drug therapy: Secondary | ICD-10-CM | POA: Insufficient documentation

## 2018-03-21 DIAGNOSIS — Z7901 Long term (current) use of anticoagulants: Secondary | ICD-10-CM | POA: Diagnosis not present

## 2018-03-21 DIAGNOSIS — I4891 Unspecified atrial fibrillation: Secondary | ICD-10-CM

## 2018-03-21 DIAGNOSIS — E785 Hyperlipidemia, unspecified: Secondary | ICD-10-CM | POA: Insufficient documentation

## 2018-03-21 DIAGNOSIS — I10 Essential (primary) hypertension: Secondary | ICD-10-CM | POA: Insufficient documentation

## 2018-03-21 DIAGNOSIS — Z823 Family history of stroke: Secondary | ICD-10-CM | POA: Insufficient documentation

## 2018-03-21 LAB — BASIC METABOLIC PANEL
ANION GAP: 9 (ref 5–15)
BUN: 18 mg/dL (ref 8–23)
CALCIUM: 9 mg/dL (ref 8.9–10.3)
CO2: 27 mmol/L (ref 22–32)
Chloride: 105 mmol/L (ref 98–111)
Creatinine, Ser: 1.05 mg/dL (ref 0.61–1.24)
GFR calc non Af Amer: >60 mL/min (ref >60)
GLUCOSE: 143 mg/dL — AB (ref 70–99)
Potassium: 3.9 mmol/L (ref 3.5–5.1)
Sodium: 141 mmol/L (ref 135–145)

## 2018-03-21 LAB — MAGNESIUM: Magnesium: 2.3 mg/dL (ref 1.7–2.4)

## 2018-03-21 NOTE — Progress Notes (Signed)
Primary Care Physician: Geoffry Paradise, MD Referring Physician:Dr. Johney Frame   Sean Bowman is a 86 y.o. male with a h/o persistent afib that was diagnosed in December of 2017. He was started on anticoagulation and weeks later had DCCV which was unsuccessful. At time of cardioversion Cardizem was doubled for better rate control and simvastatin was stopped due to drug drug interaction concerns. He was later started on rosuvastatin.  Echo showed EF of 40-45%. It was decided that he would benefit from restoring SR with tikosyn with probable TMC. He was admitted for Tikosyn initiation.He returned to SR and qtc was stable. He was eventually taken off Cardizem and some ankle edema he had improved and he only takes lasix as needed. Left on low dose BB. BB was held at one point, and had return of afib.  F/u in afib clinic, 03/16/17, for general  f/u of tikosyn. He is staying in SR. He has had all over joint discomfort including the chest, shoulders and back. He saw a specialist and is thought to have some sort of arthritis and is on prednisone which does help with symptoms. No bleeding issues with eliquis.  F/u in afib clinic 4/22,he is doing well staying in SR with Tikosyn.  He has a HR in the 40's, which is chronic but is not symptomatic. 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 No bleeding issues with eliquis 5 mg bid.  F/u in afib clinic 8/28. He feels well. He again has a HR in the upper 40's but is asymptomatic. He is taking eliquis on a regular basis. Being compliant with dofetilide as well.  F/u in afib clinic, 12/14. HE appears to be at his baseline. No afib to report just concerned re the slow HR, from  which he does not appear to be symptomatic. We have discussed in the past trying to stop the BB and today, he would like to pursue this. Reports some light nosebleeds.   Today, he denies symptoms of palpitations,  shortness of breath, orthopnea, PND  dizziness, presyncope, syncope,  or neurologic sequela. The patient is tolerating medications without difficulties and is otherwise without complaint today.   Past Medical History:  Diagnosis Date  . Arthralgia   . Benign positional vertigo   . HTN (hypertension)   . Hyperlipidemia   . Impaired glucose tolerance   . Persistent atrial fibrillation   . Senile purpura (HCC)    Past Surgical History:  Procedure Laterality Date  . CARDIOVERSION N/A 03/29/2016   Procedure: CARDIOVERSION;  Surgeon: Thurmon Fair, MD;  [facility]: [facility];  Service: Cardiovascular;  Laterality: N/A;  . CATARACT EXTRACTION W/ INTRAOCULAR LENS  IMPLANT, BILATERAL Bilateral 2017  . ORIF METACARPAL FRACTURE Left ~ 2016   S/P fall  . TONSILLECTOMY      Current Outpatient Medications  Medication Sig Dispense Refill  . apixaban (ELIQUIS) 5 MG TABS tablet Take 1 tablet (5 mg total) by mouth 2 (two) times daily. 180 tablet 3  . dofetilide (TIKOSYN) 250 MCG capsule TAKE 1 CAPSULE (250 MCG TOTAL) BY MOUTH 2 (TWO) TIMES DAILY. 180 capsule 1  . rosuvastatin (CRESTOR) 20 MG tablet Take 20 mg by mouth daily.  5   No current facility-administered medications for this encounter.     No Known Allergies  Social History   Socioeconomic History  . Marital status: Married    Spouse name: Not on file  . Number of children: Not on file  . Years of education: Not  on file  . Highest education level: Not on file  Occupational History  . Not on file  Social Needs  . Financial resource strain: Not on file  . Food insecurity:    Worry: Not on file    Inability: Not on file  . Transportation needs:    Medical: Not on file    Non-medical: Not on file  Tobacco Use  . Smoking status: Never Smoker  . Smokeless tobacco: Never Used  Substance and Sexual Activity  . Alcohol use: Yes    Alcohol/week: 2.0 standard drinks    Types: 2 Shots of liquor per week  . Drug use: No  . Sexual activity: Not on file  Lifestyle  . Physical activity:    Days per  week: Not on file    Minutes per session: Not on file  . Stress: Not on file  Relationships  . Social connections:    Talks on phone: Not on file    Gets together: Not on file    Attends religious service: Not on file    Active member of club or organization: Not on file    Attends meetings of clubs or organizations: Not on file    Relationship status: Not on file  . Intimate partner violence:    Fear of current or ex partner: Not on file    Emotionally abused: Not on file    Physically abused: Not on file    Forced sexual activity: Not on file  Other Topics Concern  . 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    Family History  Problem Relation Age of Onset  . Stroke Mother   . Alzheimer's disease Mother   . Alzheimer's disease Father     ROS- All systems are reviewed and negative except as per the HPI above  Physical Exam: Vitals:   03/21/18 1143  BP: 132/64  Pulse: (!) 48  Weight: 83.9 kg  Height: 6\' 1"  (1.854 m)   Wt Readings from Last 3 Encounters:  03/21/18 83.9 kg  11/02/17 82.6 kg  06/27/17 83.4 kg    Labs: Lab Results  Component Value Date   NA 141 11/02/2017   K 3.9 11/02/2017   CL 106 11/02/2017   CO2 28 11/02/2017   GLUCOSE 173 (H) 11/02/2017   BUN 15 11/02/2017   CREATININE 1.07 11/02/2017   CALCIUM 9.0 11/02/2017   MG 2.4 11/02/2017   No results found for: INR No results found for: CHOL, HDL, LDLCALC, TRIG   GEN- The patient is well appearing, alert and oriented x 3 today.   Head- normocephalic, atraumatic Eyes-  Sclera clear, conjunctiva pink Ears- hearing intact Oropharynx- clear Neck- supple, no JVP Lymph- no cervical lymphadenopathy Lungs- Clear to ausculation bilaterally, normal work of breathing. No tenderness of chest wall on palpation Heart- regular rate and rhythm, no murmurs, rubs or gallops, PMI not  laterally displaced GI- soft, NT, ND, + BS Extremities- no clubbing, cyanosis, or  Mild edema around bilateral ankle area MS- no significant deformity or atrophy Skin- no rash or lesion Psych- euthymic mood, full affect Neuro- strength and sensation are intact  EKG-S brady at 48 bpm, pr int 178 ms, qrs int 90 ms, qtc 434 ms  Epic records reviewed  Echo Study Conclusions  - Left ventricle: The cavity size was normal. Wall thickness was   normal.  Systolic function was mildly to moderately reduced. The   estimated ejection fraction was in the range of 40% to 45%. - Mitral valve: There was moderate regurgitation. - Left atrium: The atrium was mildly dilated. - Pulmonary arteries: Systolic pressure was mildly to moderately   increased. PA peak pressure: 39 mm Hg (S).    Assessment and Plan: 1. afib In SR with tikosyn Continue dofetilide 250 mcg a day Continue apixaban 5 mg bid for chadsvasc score of 3-4  Bmet/mag today   2. Huston FoleyBrady Asymptomatic   will trying stopping metoprolol succinate 25 mg 1/2 tab a day   F/u in 2 weeks off BB  Shenita Trego C. Matthew Folksarroll, ANP-C [facility] [address] [place], [place] [address] [phone]

## 2018-03-21 NOTE — Patient Instructions (Signed)
Stop metoprolol

## 2018-03-22 DIAGNOSIS — R7302 Impaired glucose tolerance (oral): Secondary | ICD-10-CM | POA: Diagnosis not present

## 2018-03-22 DIAGNOSIS — R82998 Other abnormal findings in urine: Secondary | ICD-10-CM | POA: Diagnosis not present

## 2018-03-22 DIAGNOSIS — I10 Essential (primary) hypertension: Secondary | ICD-10-CM | POA: Diagnosis not present

## 2018-03-22 DIAGNOSIS — E7849 Other hyperlipidemia: Secondary | ICD-10-CM | POA: Diagnosis not present

## 2018-03-22 DIAGNOSIS — Z125 Encounter for screening for malignant neoplasm of prostate: Secondary | ICD-10-CM | POA: Diagnosis not present

## 2018-03-22 DIAGNOSIS — Z0000 Encounter for general adult medical examination without abnormal findings: Secondary | ICD-10-CM | POA: Diagnosis not present

## 2018-03-22 DIAGNOSIS — M2550 Pain in unspecified joint: Secondary | ICD-10-CM | POA: Diagnosis not present

## 2018-04-05 ENCOUNTER — Encounter (HOSPITAL_COMMUNITY): Payer: Self-pay | Admitting: Nurse Practitioner

## 2018-04-05 ENCOUNTER — Ambulatory Visit (HOSPITAL_COMMUNITY)
Admission: RE | Admit: 2018-04-05 | Discharge: 2018-04-05 | Disposition: A | Discharge status: Home / Self Care | Payer: Medicare Other | Source: Ambulatory Visit | Attending: Nurse Practitioner | Admitting: Nurse Practitioner

## 2018-04-05 DIAGNOSIS — I4891 Unspecified atrial fibrillation: Secondary | ICD-10-CM | POA: Diagnosis not present

## 2018-04-05 DIAGNOSIS — I498 Other specified cardiac arrhythmias: Secondary | ICD-10-CM | POA: Diagnosis not present

## 2018-04-05 NOTE — Progress Notes (Addendum)
Pt in for EKG and BP check.  Pt stated that he has had an episode of lightheadedness that was short lived a couple of days ago. Reports no other problems.  To be reviewed by Donna Carroll, NP  Took pt off BB for low HR. His HR picked up to  51 bpm (from 48 bpm) off metoprolol. He felt a little lighthearted for a few days but now does not notice it. I will keep off BB.He really does not feel any different off drug. F/u in 4 months..  

## 2018-05-03 DIAGNOSIS — Z7952 Long term (current) use of systemic steroids: Secondary | ICD-10-CM | POA: Diagnosis not present

## 2018-05-03 DIAGNOSIS — M353 Polymyalgia rheumatica: Secondary | ICD-10-CM | POA: Diagnosis not present

## 2018-05-03 DIAGNOSIS — Z6823 Body mass index (BMI) 23.0-23.9, adult: Secondary | ICD-10-CM | POA: Diagnosis not present

## 2018-05-03 DIAGNOSIS — M2550 Pain in unspecified joint: Secondary | ICD-10-CM | POA: Diagnosis not present

## 2018-05-18 ENCOUNTER — Ambulatory Visit (HOSPITAL_COMMUNITY): Payer: Medicare Other | Admitting: Nurse Practitioner

## 2018-05-18 ENCOUNTER — Telehealth (HOSPITAL_COMMUNITY): Payer: Self-pay | Admitting: *Deleted

## 2018-05-18 NOTE — Telephone Encounter (Signed)
Patient called in stating he is having increased dizzy spells over the last week or so. Sunday he had 2 different episodes. Pt did not have equipment to check bp or pulse with at time of episodes. Discussed with [name] NP - recommends 7 day monitor to evaluate rhythm/rates.

## 2018-05-19 NOTE — Telephone Encounter (Signed)
Discussed recommendations with patient - he would like to monitor his symptoms more before committing to a monitor. Patient will call back if episodes continue.

## 2018-05-25 DIAGNOSIS — R509 Fever, unspecified: Secondary | ICD-10-CM | POA: Diagnosis not present

## 2018-05-25 DIAGNOSIS — B349 Viral infection, unspecified: Secondary | ICD-10-CM | POA: Diagnosis not present

## 2018-05-25 DIAGNOSIS — R05 Cough: Secondary | ICD-10-CM | POA: Diagnosis not present

## 2018-07-15 ENCOUNTER — Other Ambulatory Visit: Payer: Self-pay | Admitting: Nurse Practitioner

## 2018-07-24 DIAGNOSIS — M353 Polymyalgia rheumatica: Secondary | ICD-10-CM | POA: Diagnosis not present

## 2018-07-24 DIAGNOSIS — M2550 Pain in unspecified joint: Secondary | ICD-10-CM | POA: Diagnosis not present

## 2018-07-24 DIAGNOSIS — Z7952 Long term (current) use of systemic steroids: Secondary | ICD-10-CM | POA: Diagnosis not present

## 2018-08-10 ENCOUNTER — Ambulatory Visit (HOSPITAL_COMMUNITY): Payer: Medicare Other | Admitting: Nurse Practitioner

## 2018-08-15 ENCOUNTER — Other Ambulatory Visit: Payer: Self-pay

## 2018-08-15 ENCOUNTER — Ambulatory Visit (HOSPITAL_COMMUNITY)
Admission: RE | Admit: 2018-08-15 | Discharge: 2018-08-15 | Disposition: A | Discharge status: Home / Self Care | Payer: Medicare Other | Source: Ambulatory Visit | Attending: Nurse Practitioner | Admitting: Nurse Practitioner

## 2018-08-15 ENCOUNTER — Encounter (HOSPITAL_COMMUNITY): Payer: Self-pay | Admitting: Nurse Practitioner

## 2018-08-15 VITALS — BP 148/72 | HR 60 | Ht 73.0 in | Wt 175.8 lb

## 2018-08-15 DIAGNOSIS — Z820 Family history of epilepsy and other diseases of the nervous system: Secondary | ICD-10-CM | POA: Diagnosis not present

## 2018-08-15 DIAGNOSIS — Z823 Family history of stroke: Secondary | ICD-10-CM | POA: Insufficient documentation

## 2018-08-15 DIAGNOSIS — I4891 Unspecified atrial fibrillation: Secondary | ICD-10-CM | POA: Diagnosis not present

## 2018-08-15 DIAGNOSIS — E785 Hyperlipidemia, unspecified: Secondary | ICD-10-CM | POA: Diagnosis not present

## 2018-08-15 DIAGNOSIS — Z7901 Long term (current) use of anticoagulants: Secondary | ICD-10-CM | POA: Insufficient documentation

## 2018-08-15 DIAGNOSIS — I10 Essential (primary) hypertension: Secondary | ICD-10-CM | POA: Insufficient documentation

## 2018-08-15 DIAGNOSIS — I4819 Other persistent atrial fibrillation: Secondary | ICD-10-CM | POA: Insufficient documentation

## 2018-08-15 DIAGNOSIS — Z79899 Other long term (current) drug therapy: Secondary | ICD-10-CM | POA: Diagnosis not present

## 2018-08-15 LAB — BASIC METABOLIC PANEL
Anion gap: 7 (ref 5–15)
BUN: 18 mg/dL (ref 8–23)
CO2: 27 mmol/L (ref 22–32)
Calcium: 8.8 mg/dL — ABNORMAL LOW (ref 8.9–10.3)
Chloride: 107 mmol/L (ref 98–111)
Creatinine, Ser: 0.87 mg/dL (ref 0.61–1.24)
GFR calc Af Amer: >60 mL/min (ref >60)
GFR calc non Af Amer: >60 mL/min (ref >60)
Glucose, Bld: 97 mg/dL (ref 70–99)
Potassium: 4 mmol/L (ref 3.5–5.1)
Sodium: 141 mmol/L (ref 135–145)

## 2018-08-15 LAB — MAGNESIUM: Magnesium: 2.3 mg/dL (ref 1.7–2.4)

## 2018-08-15 NOTE — Progress Notes (Signed)
Primary Care Physician: Geoffry Paradise, MD Referring Physician:Dr. Johney Frame   Sean Bowman is a 85 y.o. male with a h/o persistent afib that was diagnosed in December of 2017. He was started on anticoagulation and weeks later had DCCV which was unsuccessful. At time of cardioversion Cardizem was doubled for better rate control and simvastatin was stopped due to drug drug interaction concerns. He was later started on rosuvastatin.  Echo showed EF of 40-45%. It was decided that he would benefit from restoring SR with tikosyn with probable TMC. He was admitted for Tikosyn initiation.He returned to SR and qtc was stable. He was eventually taken off Cardizem and some ankle edema he had improved and he only takes lasix as needed. Left on low dose BB. BB was held at one point, and had return of afib.  F/u in afib clinic, 03/16/17, for general  f/u of tikosyn. He is staying in SR. He has had all over joint discomfort including the chest, shoulders and back. He saw a specialist and is thought to have some sort of arthritis and is on prednisone which does help with symptoms. No bleeding issues with eliquis.  F/u in afib clinic 4/22,he is doing well staying in SR with Tikosyn.  He has a HR in the 40's, which is chronic but is not symptomatic. 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 No bleeding issues with eliquis 5 mg bid.  F/u in afib clinic 8/28. He feels well. He again has a HR in the upper 40's but is asymptomatic. He is taking eliquis on a regular basis. Being compliant with dofetilide as well.  F/u in afib clinic, 12/14. HE appears to be at his baseline. No afib to report just concerned re the slow HR, from  which he does not appear to be symptomatic. We have discussed in the past trying to stop the BB and today, he would like to pursue this. Reports some light nosebleeds.   F/u in afib clinic, 6/9. He feels well. He is staying in SR. No complaints other than very mild dizziness with  position change. He is not currently  on any medication that could contribute to this.   Today, he denies symptoms of palpitations,  shortness of breath, orthopnea, PND  dizziness, presyncope, syncope, or neurologic sequela. The patient is tolerating medications without difficulties and is otherwise without complaint today.   Past Medical History:  Diagnosis Date  . Arthralgia   . Benign positional vertigo   . HTN (hypertension)   . Hyperlipidemia   . Impaired glucose tolerance   . Persistent atrial fibrillation   . Senile purpura (HCC)    Past Surgical History:  Procedure Laterality Date  . CARDIOVERSION N/A 03/29/2016   Procedure: CARDIOVERSION;  Surgeon: Thurmon Fair, MD;  Location: [facility];  Service: Cardiovascular;  Laterality: N/A;  . CATARACT EXTRACTION W/ INTRAOCULAR LENS  IMPLANT, BILATERAL Bilateral 2017  . ORIF METACARPAL FRACTURE Left ~ 2016   S/P fall  . TONSILLECTOMY      Current Outpatient Medications  Medication Sig Dispense Refill  . apixaban (ELIQUIS) 5 MG TABS tablet Take 1 tablet (5 mg total) by mouth 2 (two) times daily. 180 tablet 3  . dofetilide (TIKOSYN) 250 MCG capsule TAKE 1 CAPSULE (250 MCG TOTAL) BY MOUTH 2 (TWO) TIMES DAILY. 180 capsule 1  . predniSONE (DELTASONE) 5 MG tablet Take 5 mg by mouth daily with breakfast.    . rosuvastatin (CRESTOR) 20 MG tablet Take 20 mg  by mouth daily.  5   No current facility-administered medications for this encounter.     No Known Allergies  Social History   Socioeconomic History  . Marital status: Married    Spouse name: Not on file  . Number of children: Not on file  . Years of education: Not on file  . Highest education level: Not on file  Occupational History  . Not on file  Social Needs  . Financial resource strain: Not on file  . Food insecurity:    Worry: Not on file    Inability: Not on file  . Transportation needs:    Medical: Not on file    Non-medical: Not on file  Tobacco Use  .  Smoking status: Never Smoker  . Smokeless tobacco: Never Used  Substance and Sexual Activity  . Alcohol use: Yes    Alcohol/week: 2.0 standard drinks    Types: 2 Shots of liquor per week  . Drug use: No  . Sexual activity: Not on file  Lifestyle  . Physical activity:    Days per week: Not on file    Minutes per session: Not on file  . Stress: Not on file  Relationships  . Social connections:    Talks on phone: Not on file    Gets together: Not on file    Attends religious service: Not on file    Active member of club or organization: Not on file    Attends meetings of clubs or organizations: Not on file    Relationship status: Not on file  . Intimate partner violence:    Fear of current or ex partner: Not on file    Emotionally abused: Not on file    Physically abused: Not on file    Forced sexual activity: Not on file  Other Topics Concern  . 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    Family History  Problem Relation Age of Onset  . Stroke Mother   . Alzheimer's disease Mother   . Alzheimer's disease Father     ROS- All systems are reviewed and negative except as per the HPI above  Physical Exam: Vitals:   08/15/18 1134  BP: (!) 148/72  Pulse: 60  Weight: 79.7 kg  Height: 6\' 1"  (1.854 m)   Wt Readings from Last 3 Encounters:  08/15/18 79.7 kg  04/05/18 82.1 kg  03/21/18 83.9 kg    Labs: Lab Results  Component Value Date   NA 141 03/21/2018   K 3.9 03/21/2018   CL 105 03/21/2018   CO2 27 03/21/2018   GLUCOSE 143 (H) 03/21/2018   BUN 18 03/21/2018   CREATININE 1.05 03/21/2018   CALCIUM 9.0 03/21/2018   MG 2.3 03/21/2018   No results found for: INR No results found for: CHOL, HDL, LDLCALC, TRIG   GEN- The patient is well appearing, alert and oriented x 3 today.   Head- normocephalic, atraumatic Eyes-  Sclera clear, conjunctiva  pink Ears- hearing intact Oropharynx- clear Neck- supple, no JVP Lymph- no cervical lymphadenopathy Lungs- Clear to ausculation bilaterally, normal work of breathing. No tenderness of chest wall on palpation Heart- regular rate and rhythm, no murmurs, rubs or gallops, PMI not laterally displaced GI- soft, NT, ND, + BS Extremities- no clubbing, cyanosis, or  Mild edema around bilateral ankle area MS- no significant deformity or atrophy Skin-  no rash or lesion Psych- euthymic mood, full affect Neuro- strength and sensation are intact  EKG-S brady at 60  bpm, pr int 164 ms, qrs int 92 ms, qtc 452 ms  Epic records reviewed  Echo Study Conclusions  - Left ventricle: The cavity size was normal. Wall thickness was   normal. Systolic function was mildly to moderately reduced. The   estimated ejection fraction was in the range of 40% to 45%. - Mitral valve: There was moderate regurgitation. - Left atrium: The atrium was mildly dilated. - Pulmonary arteries: Systolic pressure was mildly to moderately   increased. PA peak pressure: 39 mm Hg (S).    Assessment and Plan: 1. afib In SR with tikosyn Continue dofetilide 250 mcg a day qtc stable Continue apixaban 5 mg bid for chadsvasc score of 3-4  Bmet/mag today   2. Brady Improved with stopping bb   F/u in 4 months   Geroge Baseman. , [facility] [address] [place], [place] [address] [phone]

## 2018-08-15 NOTE — Progress Notes (Signed)
Pt in for EKG and BP check.  Pt stated that he has had an episode of lightheadedness that was short lived a couple of days ago. Reports no other problems.  To be reviewed by Roderic Palau, NP  Took pt off BB for low HR. His HR picked up to  51 bpm (from 48 bpm) off metoprolol. He felt a little lighthearted for a few days but now does not notice it. I will keep off BB.He really does not feel any different off drug. F/u in 4 months.Marland Kitchen

## 2018-08-18 DIAGNOSIS — Z961 Presence of intraocular lens: Secondary | ICD-10-CM | POA: Diagnosis not present

## 2018-08-18 DIAGNOSIS — H401131 Primary open-angle glaucoma, bilateral, mild stage: Secondary | ICD-10-CM | POA: Diagnosis not present

## 2018-08-18 DIAGNOSIS — H04123 Dry eye syndrome of bilateral lacrimal glands: Secondary | ICD-10-CM | POA: Diagnosis not present

## 2018-08-18 DIAGNOSIS — H18413 Arcus senilis, bilateral: Secondary | ICD-10-CM | POA: Diagnosis not present

## 2018-09-05 ENCOUNTER — Other Ambulatory Visit: Payer: Self-pay | Admitting: Dermatology

## 2018-09-05 DIAGNOSIS — L570 Actinic keratosis: Secondary | ICD-10-CM | POA: Diagnosis not present

## 2018-09-05 DIAGNOSIS — C44519 Basal cell carcinoma of skin of other part of trunk: Secondary | ICD-10-CM | POA: Diagnosis not present

## 2018-09-05 DIAGNOSIS — D229 Melanocytic nevi, unspecified: Secondary | ICD-10-CM | POA: Diagnosis not present

## 2018-09-05 DIAGNOSIS — C44529 Squamous cell carcinoma of skin of other part of trunk: Secondary | ICD-10-CM | POA: Diagnosis not present

## 2018-09-18 DIAGNOSIS — R7302 Impaired glucose tolerance (oral): Secondary | ICD-10-CM | POA: Diagnosis not present

## 2018-09-18 DIAGNOSIS — M2550 Pain in unspecified joint: Secondary | ICD-10-CM | POA: Diagnosis not present

## 2018-09-18 DIAGNOSIS — I10 Essential (primary) hypertension: Secondary | ICD-10-CM | POA: Diagnosis not present

## 2018-09-18 DIAGNOSIS — E785 Hyperlipidemia, unspecified: Secondary | ICD-10-CM | POA: Diagnosis not present

## 2018-10-19 DIAGNOSIS — C44529 Squamous cell carcinoma of skin of other part of trunk: Secondary | ICD-10-CM | POA: Diagnosis not present

## 2018-10-19 DIAGNOSIS — C44519 Basal cell carcinoma of skin of other part of trunk: Secondary | ICD-10-CM | POA: Diagnosis not present

## 2018-10-23 DIAGNOSIS — Z7952 Long term (current) use of systemic steroids: Secondary | ICD-10-CM | POA: Diagnosis not present

## 2018-10-23 DIAGNOSIS — Z6822 Body mass index (BMI) 22.0-22.9, adult: Secondary | ICD-10-CM | POA: Diagnosis not present

## 2018-10-23 DIAGNOSIS — M353 Polymyalgia rheumatica: Secondary | ICD-10-CM | POA: Diagnosis not present

## 2018-10-23 DIAGNOSIS — M2550 Pain in unspecified joint: Secondary | ICD-10-CM | POA: Diagnosis not present

## 2018-10-26 DIAGNOSIS — B351 Tinea unguium: Secondary | ICD-10-CM | POA: Diagnosis not present

## 2018-10-26 DIAGNOSIS — I4891 Unspecified atrial fibrillation: Secondary | ICD-10-CM | POA: Diagnosis not present

## 2018-10-26 DIAGNOSIS — L84 Corns and callosities: Secondary | ICD-10-CM | POA: Diagnosis not present

## 2018-11-07 ENCOUNTER — Other Ambulatory Visit: Payer: Self-pay

## 2018-11-07 ENCOUNTER — Encounter: Payer: Self-pay | Admitting: Podiatry

## 2018-11-07 ENCOUNTER — Ambulatory Visit: Payer: Medicare Other | Admitting: Podiatry

## 2018-11-07 ENCOUNTER — Ambulatory Visit (INDEPENDENT_AMBULATORY_CARE_PROVIDER_SITE_OTHER): Payer: Medicare Other

## 2018-11-07 ENCOUNTER — Other Ambulatory Visit: Payer: Self-pay | Admitting: Podiatry

## 2018-11-07 VITALS — BP 152/79

## 2018-11-07 DIAGNOSIS — M205X2 Other deformities of toe(s) (acquired), left foot: Secondary | ICD-10-CM

## 2018-11-07 DIAGNOSIS — Z7901 Long term (current) use of anticoagulants: Secondary | ICD-10-CM

## 2018-11-07 DIAGNOSIS — L84 Corns and callosities: Secondary | ICD-10-CM

## 2018-11-07 DIAGNOSIS — M79672 Pain in left foot: Secondary | ICD-10-CM

## 2018-11-14 NOTE — Progress Notes (Signed)
Subjective:   Patient ID: Sean Bowman, male   DOB: 85 y.o.   MRN: [id number]   HPI 85 year old male presents the office today for concerns of a painful spot on the side of his left big toe.  He says about 4 weeks ago he did try to cut himself and cause bleeding.  He does not think it is infected but he wants to have the area checked.  This been ongoing for last 4 weeks.  Describes intermittent sharp pain.  No tenderness or any drainage or pus.  Pain is worse with pressure the fifth toe overlying skin lesion.  He is currently on Eliquis.  He has no other concerns today.   Review of Systems  All other systems reviewed and are negative.  Past Medical History:  Diagnosis Date  . Arthralgia   . Benign positional vertigo   . HTN (hypertension)   . Hyperlipidemia   . Impaired glucose tolerance   . Persistent atrial fibrillation   . Senile purpura (HCC)     Past Surgical History:  Procedure Laterality Date  . CARDIOVERSION N/A 03/29/2016   Procedure: CARDIOVERSION;  Surgeon: Thurmon Fair, MD;  Location: [facility];  Service: Cardiovascular;  Laterality: N/A;  . CATARACT EXTRACTION W/ INTRAOCULAR LENS  IMPLANT, BILATERAL Bilateral 2017  . ORIF METACARPAL FRACTURE Left ~ 2016   S/P fall  . TONSILLECTOMY       Current Outpatient Medications:  .  apixaban (ELIQUIS) 5 MG TABS tablet, Take 1 tablet (5 mg total) by mouth 2 (two) times daily., Disp: 180 tablet, Rfl: 3 .  dofetilide (TIKOSYN) 250 MCG capsule, TAKE 1 CAPSULE (250 MCG TOTAL) BY MOUTH 2 (TWO) TIMES DAILY., Disp: 180 capsule, Rfl: 1 .  latanoprost (XALATAN) 0.005 % ophthalmic solution, Place 1 drop into both eyes at bedtime., Disp: , Rfl:  .  metoprolol succinate (TOPROL-XL) 25 MG 24 hr tablet, metoprolol succinate ER 25 mg tablet,extended release 24 hr, Disp: , Rfl:  .  predniSONE (DELTASONE) 5 MG tablet, Take 5 mg by mouth daily with breakfast., Disp: , Rfl:  .  rosuvastatin (CRESTOR) 20 MG tablet, Take 20 mg by mouth daily.,  Disp: , Rfl: 5 .  triamcinolone cream (KENALOG) 0.1 %, APPLY TO AFFECTED AREA AFTER BATHING, Disp: , Rfl:   No Known Allergies       Objective:  Physical Exam  General: AAO x3, NAD  Dermatological: Hyperkeratotic lesion on the distal lateral aspect of the left fifth toe just adjacent the toenail.  Upon debridement of the thick hyperkeratotic tissue there is no ongoing ulceration, drainage or any clinical signs of infection noted today.  The nail itself is also hypertrophic, dystrophic with yellow-brown discoloration.  There is no edema, erythema or any other signs of infection.  Vascular: Dorsalis Pedis artery and Posterior Tibial artery pedal pulses are 2/4 bilateral with immedate capillary fill time.  There is no pain with calf compression, swelling, warmth, erythema.   Neruologic: Grossly intact via light touch bilateral. Protective threshold with Semmes Wienstein monofilament intact to all pedal sites bilateral.   Musculoskeletal: Adductovarus present of the fifth toe.  Tenderness prior to debridement of the hyperkeratotic lesion.  MMT 5/5.  Gait: Unassisted, Nonantalgic.       Assessment:   Hyperkeratotic lesion due to digital deformity     Plan:  -Treatment options discussed including all alternatives, risks, and complications -Etiology of symptoms were discussed -X-rays were obtained and reviewed with the patient. Adductovarus is present fifth toe.  No evidence of acute fracture, osteomyelitis. -Debrided the hyperkeratotic lesions without any complications or bleeding.  Dispensed offloading pads.  Discussed shoe modifications.  He can apply small amount of moisturizer directly on the callus but not apply interdigitally.  Return if symptoms worsen or fail to improve.  Trula Slade DPM

## 2018-12-11 DIAGNOSIS — I4891 Unspecified atrial fibrillation: Secondary | ICD-10-CM | POA: Diagnosis not present

## 2018-12-11 DIAGNOSIS — W1800XA Striking against unspecified object with subsequent fall, initial encounter: Secondary | ICD-10-CM | POA: Diagnosis not present

## 2018-12-12 ENCOUNTER — Encounter (HOSPITAL_COMMUNITY): Payer: Self-pay | Admitting: Nurse Practitioner

## 2018-12-12 ENCOUNTER — Other Ambulatory Visit: Payer: Self-pay

## 2018-12-12 ENCOUNTER — Ambulatory Visit (HOSPITAL_COMMUNITY)
Admission: RE | Admit: 2018-12-12 | Discharge: 2018-12-12 | Disposition: A | Discharge status: Home / Self Care | Payer: Medicare Other | Source: Ambulatory Visit | Attending: Nurse Practitioner | Admitting: Nurse Practitioner

## 2018-12-12 VITALS — BP 140/80 | HR 61 | Ht 73.0 in | Wt 177.2 lb

## 2018-12-12 DIAGNOSIS — R7302 Impaired glucose tolerance (oral): Secondary | ICD-10-CM | POA: Insufficient documentation

## 2018-12-12 DIAGNOSIS — I4819 Other persistent atrial fibrillation: Secondary | ICD-10-CM | POA: Diagnosis not present

## 2018-12-12 DIAGNOSIS — Z8249 Family history of ischemic heart disease and other diseases of the circulatory system: Secondary | ICD-10-CM | POA: Diagnosis not present

## 2018-12-12 DIAGNOSIS — Z7901 Long term (current) use of anticoagulants: Secondary | ICD-10-CM | POA: Diagnosis not present

## 2018-12-12 DIAGNOSIS — E785 Hyperlipidemia, unspecified: Secondary | ICD-10-CM | POA: Diagnosis not present

## 2018-12-12 DIAGNOSIS — I10 Essential (primary) hypertension: Secondary | ICD-10-CM | POA: Insufficient documentation

## 2018-12-12 DIAGNOSIS — I4891 Unspecified atrial fibrillation: Secondary | ICD-10-CM | POA: Diagnosis not present

## 2018-12-12 DIAGNOSIS — Z79899 Other long term (current) drug therapy: Secondary | ICD-10-CM | POA: Insufficient documentation

## 2018-12-12 LAB — BASIC METABOLIC PANEL
Anion gap: 7 (ref 5–15)
BUN: 13 mg/dL (ref 8–23)
CO2: 27 mmol/L (ref 22–32)
Calcium: 9 mg/dL (ref 8.9–10.3)
Chloride: 105 mmol/L (ref 98–111)
Creatinine, Ser: 0.95 mg/dL (ref 0.61–1.24)
GFR calc Af Amer: >60 mL/min (ref >60)
GFR calc non Af Amer: >60 mL/min (ref >60)
Glucose, Bld: 89 mg/dL (ref 70–99)
Potassium: 4.4 mmol/L (ref 3.5–5.1)
Sodium: 139 mmol/L (ref 135–145)

## 2018-12-12 LAB — MAGNESIUM: Magnesium: 2.4 mg/dL (ref 1.7–2.4)

## 2018-12-12 NOTE — Progress Notes (Signed)
Primary Care Physician: Geoffry Paradise, MD Referring Physician:Dr. Johney Frame   Sean Bowman is a 85 y.o. male with a h/o persistent afib that was diagnosed in December of 2017. He was started on anticoagulation and weeks later had DCCV which was unsuccessful. At time of cardioversion Cardizem was doubled for better rate control and simvastatin was stopped due to drug drug interaction concerns. He was later started on rosuvastatin.  Echo showed EF of 40-45%. It was decided that he would benefit from restoring SR with tikosyn with probable TMC. He was admitted for Tikosyn initiation.He returned to SR and qtc was stable. He was eventually taken off Cardizem and some ankle edema he had improved and he only takes lasix as needed. Left on low dose BB. BB was held at one point, and had return of afib.  F/u in afib clinic, 03/16/17, for general  f/u of tikosyn. He is staying in SR. He has had all over joint discomfort including the chest, shoulders and back. He saw a specialist and is thought to have some sort of arthritis and is on prednisone which does help with symptoms. No bleeding issues with eliquis.  F/u in afib clinic 4/22,he is doing well staying in SR with Tikosyn.  He has a HR in the 40's, which is chronic but is not symptomatic. 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 No bleeding issues with eliquis 5 mg bid.  F/u in afib clinic 8/28. He feels well. He again has a HR in the upper 40's but is asymptomatic. He is taking eliquis on a regular basis. Being compliant with dofetilide as well.  F/u in afib clinic, 12/14. HE appears to be at his baseline. No afib to report just concerned re the slow HR, from  which he does not appear to be symptomatic. We have discussed in the past trying to stop the BB and today, he would like to pursue this. Reports some light nosebleeds.   F/u in afib clinic, 6/9. He feels well. He is staying in SR. No complaints other than very mild dizziness with  position change. He is not currently  on any medication that could contribute to this.   F/u 12/12/18. He reports that he feels well. No issues with afib. Continues on Tikosyn. No issues with anticoagulation.  ation.Today, he denies symptoms of palpitations,  shortness of breath, orthopnea, PND  dizziness, presyncope, syncope, or neurologic sequela. The patient is tolerating medications without difficulties and is otherwise without complaint today.   Past Medical History:  Diagnosis Date   Arthralgia    Benign positional vertigo    HTN (hypertension)    Hyperlipidemia    Impaired glucose tolerance    Persistent atrial fibrillation (HCC)    Senile purpura (HCC)    Past Surgical History:  Procedure Laterality Date   CARDIOVERSION N/A 03/29/2016   Procedure: CARDIOVERSION;  Surgeon: Thurmon Fair, MD;  Location: [facility];  Service: Cardiovascular;  Laterality: N/A;   CATARACT EXTRACTION W/ INTRAOCULAR LENS  IMPLANT, BILATERAL Bilateral 2017   ORIF METACARPAL FRACTURE Left ~ 2016   S/P fall   TONSILLECTOMY      Current Outpatient Medications  Medication Sig Dispense Refill   apixaban (ELIQUIS) 5 MG TABS tablet Take 1 tablet (5 mg total) by mouth 2 (two) times daily. 180 tablet 3   dofetilide (TIKOSYN) 250 MCG capsule TAKE 1 CAPSULE (250 MCG TOTAL) BY MOUTH 2 (TWO) TIMES DAILY. 180 capsule 1   latanoprost (XALATAN) 0.005 %  ophthalmic solution Place 1 drop into both eyes at bedtime.     metoprolol succinate (TOPROL-XL) 25 MG 24 hr tablet metoprolol succinate ER 25 mg tablet,extended release 24 hr     predniSONE (DELTASONE) 5 MG tablet Take 5 mg by mouth daily with breakfast.     rosuvastatin (CRESTOR) 20 MG tablet Take 20 mg by mouth daily.  5   triamcinolone cream (KENALOG) 0.1 % APPLY TO AFFECTED AREA AFTER BATHING     No current facility-administered medications for this encounter.     No Known Allergies  Social History   Socioeconomic History   Marital  status: Married    Spouse name: Not on file   Number of children: Not on file   Years of education: Not on file   Highest education level: Not on file  Occupational History   Not on file  Social Needs   Financial resource strain: Not on file   Food insecurity    Worry: Not on file    Inability: Not on file   Transportation needs    Medical: Not on file    Non-medical: Not on file  Tobacco Use   Smoking status: Never Smoker   Smokeless tobacco: Never Used  Substance and Sexual Activity   Alcohol use: Yes    Alcohol/week: 2.0 standard drinks    Types: 2 Shots of liquor per week   Drug use: No   Sexual activity: Not on file  Lifestyle   Physical activity    Days per week: Not on file    Minutes per session: Not on file   Stress: Not on file  Relationships   Social connections    Talks on phone: Not on file    Gets together: Not on file    Attends religious service: Not on file    Active member of club or organization: Not on file    Attends meetings of clubs or organizations: Not on file    Relationship status: Not on file   Intimate partner violence    Fear of current or ex partner: Not on file    Emotionally abused: Not on file    Physically abused: Not on file    Forced sexual activity: Not on file  Other Topics Concern   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    Family History  Problem Relation Age of Onset   Stroke Mother    Alzheimer's disease Mother    Alzheimer's disease Father     ROS- All systems are reviewed and negative except as per the HPI above  Physical Exam: Vitals:   12/12/18 0846  BP: 140/80  Pulse: 61  Weight: 80.4 kg  Height: 6\' 1"  (1.854 m)   Wt Readings from Last 3 Encounters:  12/12/18 80.4 kg  08/15/18 79.7 kg  04/05/18 82.1 kg    Labs: Lab Results  Component Value Date   NA 141  08/15/2018   K 4.0 08/15/2018   CL 107 08/15/2018   CO2 27 08/15/2018   GLUCOSE 97 08/15/2018   BUN 18 08/15/2018   CREATININE 0.87 08/15/2018   CALCIUM 8.8 (L) 08/15/2018   MG 2.3 08/15/2018   No results found for: INR No results found for: CHOL, HDL, LDLCALC, TRIG   GEN- The patient is well appearing, alert and oriented x 3 today.   Head- normocephalic, atraumatic  Eyes-  Sclera clear, conjunctiva pink Ears- hearing intact Oropharynx- clear Neck- supple, no JVP Lymph- no cervical lymphadenopathy Lungs- Clear to ausculation bilaterally, normal work of breathing. No tenderness of chest wall on palpation Heart- regular rate and rhythm, no murmurs, rubs or gallops, PMI not laterally displaced GI- soft, NT, ND, + BS Extremities- no clubbing, cyanosis, or  Mild edema around bilateral ankle area MS- no significant deformity or atrophy Skin- no rash or lesion Psych- euthymic mood, full affect Neuro- strength and sensation are intact  EKG-S brady at 60  bpm, pr int 164 ms, qrs int 92 ms, qtc 452 ms  Epic records reviewed  Echo Study Conclusions  - Left ventricle: The cavity size was normal. Wall thickness was   normal. Systolic function was mildly to moderately reduced. The   estimated ejection fraction was in the range of 40% to 45%. - Mitral valve: There was moderate regurgitation. - Left atrium: The atrium was mildly dilated. - Pulmonary arteries: Systolic pressure was mildly to moderately   increased. PA peak pressure: 39 mm Hg (S).    Assessment and Plan: 1. Afib In SR with tikosyn Continue dofetilide 250 mcg a day qtc stable Continue apixaban 5 mg bid for chadsvasc score of 3-4  Bmet/mag today   2. Brady Improved with stopping bb   F/u in 4 months   Geroge Baseman. Victoire Deans, [facility] [address] [place], [place] [address] [phone]

## 2018-12-21 ENCOUNTER — Other Ambulatory Visit: Payer: Self-pay | Admitting: Nurse Practitioner

## 2019-02-20 DIAGNOSIS — Z961 Presence of intraocular lens: Secondary | ICD-10-CM | POA: Diagnosis not present

## 2019-02-20 DIAGNOSIS — H401131 Primary open-angle glaucoma, bilateral, mild stage: Secondary | ICD-10-CM | POA: Diagnosis not present

## 2019-02-20 DIAGNOSIS — H18413 Arcus senilis, bilateral: Secondary | ICD-10-CM | POA: Diagnosis not present

## 2019-02-20 DIAGNOSIS — H04123 Dry eye syndrome of bilateral lacrimal glands: Secondary | ICD-10-CM | POA: Diagnosis not present

## 2019-02-22 DIAGNOSIS — M2550 Pain in unspecified joint: Secondary | ICD-10-CM | POA: Diagnosis not present

## 2019-02-22 DIAGNOSIS — M353 Polymyalgia rheumatica: Secondary | ICD-10-CM | POA: Diagnosis not present

## 2019-02-22 DIAGNOSIS — Z7952 Long term (current) use of systemic steroids: Secondary | ICD-10-CM | POA: Diagnosis not present

## 2019-02-22 DIAGNOSIS — Z6823 Body mass index (BMI) 23.0-23.9, adult: Secondary | ICD-10-CM | POA: Diagnosis not present

## 2019-03-15 ENCOUNTER — Ambulatory Visit (HOSPITAL_COMMUNITY)
Admission: RE | Admit: 2019-03-15 | Discharge: 2019-03-15 | Disposition: A | Discharge status: Home / Self Care | Payer: Medicare Other | Source: Ambulatory Visit | Attending: Nurse Practitioner | Admitting: Nurse Practitioner

## 2019-03-15 ENCOUNTER — Other Ambulatory Visit: Payer: Self-pay

## 2019-03-15 ENCOUNTER — Encounter (HOSPITAL_COMMUNITY): Payer: Self-pay | Admitting: Nurse Practitioner

## 2019-03-15 VITALS — BP 150/70 | HR 43 | Ht 73.0 in | Wt 178.4 lb

## 2019-03-15 DIAGNOSIS — Z7901 Long term (current) use of anticoagulants: Secondary | ICD-10-CM | POA: Insufficient documentation

## 2019-03-15 DIAGNOSIS — I4891 Unspecified atrial fibrillation: Secondary | ICD-10-CM

## 2019-03-15 DIAGNOSIS — D6869 Other thrombophilia: Secondary | ICD-10-CM | POA: Diagnosis not present

## 2019-03-15 DIAGNOSIS — Z79899 Other long term (current) drug therapy: Secondary | ICD-10-CM | POA: Diagnosis not present

## 2019-03-15 DIAGNOSIS — Z7952 Long term (current) use of systemic steroids: Secondary | ICD-10-CM | POA: Insufficient documentation

## 2019-03-15 DIAGNOSIS — E785 Hyperlipidemia, unspecified: Secondary | ICD-10-CM | POA: Insufficient documentation

## 2019-03-15 DIAGNOSIS — I4819 Other persistent atrial fibrillation: Secondary | ICD-10-CM | POA: Insufficient documentation

## 2019-03-15 DIAGNOSIS — I10 Essential (primary) hypertension: Secondary | ICD-10-CM | POA: Diagnosis not present

## 2019-03-15 LAB — MAGNESIUM: Magnesium: 2.3 mg/dL (ref 1.7–2.4)

## 2019-03-15 LAB — BASIC METABOLIC PANEL
Anion gap: 5 (ref 5–15)
BUN: 15 mg/dL (ref 8–23)
CO2: 29 mmol/L (ref 22–32)
Calcium: 9.1 mg/dL (ref 8.9–10.3)
Chloride: 107 mmol/L (ref 98–111)
Creatinine, Ser: 1.11 mg/dL (ref 0.61–1.24)
GFR calc Af Amer: >60 mL/min (ref >60)
GFR calc non Af Amer: 59 mL/min — ABNORMAL LOW (ref >60)
Glucose, Bld: 139 mg/dL — ABNORMAL HIGH (ref 70–99)
Potassium: 4.2 mmol/L (ref 3.5–5.1)
Sodium: 141 mmol/L (ref 135–145)

## 2019-03-15 NOTE — Progress Notes (Signed)
Primary Care Physician: Geoffry Paradise, MD Referring Physician:Dr. Johney Frame   Sean Bowman is a 86 y.o. male with a h/o persistent afib that was diagnosed in December of 2017. He was started on anticoagulation and weeks later had DCCV which was unsuccessful. At time of cardioversion Cardizem was doubled for better rate control and simvastatin was stopped due to drug drug interaction concerns. He was later started on rosuvastatin.  Echo showed EF of 40-45%. It was decided that he would benefit from restoring SR with tikosyn with probable TMC. He was admitted for Tikosyn initiation.He returned to SR and qtc was stable. He was eventually taken off Cardizem and some ankle edema he had improved and he only takes lasix as needed. Left on low dose BB. BB was held at one point, and had return of afib.  F/u in afib clinic, 03/16/17, for general  f/u of tikosyn. He is staying in SR. He has had all over joint discomfort including the chest, shoulders and back. He saw a specialist and is thought to have some sort of arthritis and is on prednisone which does help with symptoms. No bleeding issues with eliquis.  F/u in afib clinic 4/22,he is doing well staying in SR with Tikosyn.  He has a HR in the 40's, which is chronic but is not symptomatic. 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 No bleeding issues with eliquis 5 mg bid.  F/u in afib clinic 8/28. He feels well. He again has a HR in the upper 40's but is asymptomatic. He is taking eliquis on a regular basis. Being compliant with dofetilide as well.  F/u in afib clinic, 12/14. HE appears to be at his baseline. No afib to report just concerned re the slow HR, from  which he does not appear to be symptomatic. We have discussed in the past trying to stop the BB and today, he would like to pursue this. Reports some light nosebleeds.   F/u in afib clinic, 6/9. He feels well. He is staying in SR. No complaints other than very mild dizziness with  position change. He is not currently  on any medication that could contribute to this.   F/u 12/12/18. He reports that he feels well. No issues with afib. Continues on Tikosyn. No issues with anticoagulation.  F/i in afib clinic 03/15/19. He has not noted any afib. Continues on tikosyn and apixaban 5 mg bid for a CHA2DS2VASc of 3. No issues with bleeding. HR's in the 40's today that has been present in the past. He is not symptomatic nor on rate control.   ation.Today, he denies symptoms of palpitations,  shortness of breath, orthopnea, PND  dizziness, presyncope, syncope, or neurologic sequela. The patient is tolerating medications without difficulties and is otherwise without complaint today.   Past Medical History:  Diagnosis Date  . Arthralgia   . Benign positional vertigo   . HTN (hypertension)   . Hyperlipidemia   . Impaired glucose tolerance   . Persistent atrial fibrillation (HCC)   . Senile purpura (HCC)    Past Surgical History:  Procedure Laterality Date  . CARDIOVERSION N/A 03/29/2016   Procedure: CARDIOVERSION;  Surgeon: Thurmon Fair, MD;  Location: [facility] ENDOSCOPY;  Service: Cardiovascular;  Laterality: N/A;  . CATARACT EXTRACTION W/ INTRAOCULAR LENS  IMPLANT, BILATERAL Bilateral 2017  . ORIF METACARPAL FRACTURE Left ~ 2016   S/P fall  . TONSILLECTOMY      Current Outpatient Medications  Medication Sig Dispense Refill  .  apixaban (ELIQUIS) 5 MG TABS tablet Take 1 tablet (5 mg total) by mouth 2 (two) times daily. 180 tablet 3  . dofetilide (TIKOSYN) 250 MCG capsule TAKE 1 CAPSULE (250 MCG TOTAL) BY MOUTH 2 (TWO) TIMES DAILY. 180 capsule 1  . latanoprost (XALATAN) 0.005 % ophthalmic solution Place 1 drop into both eyes at bedtime.    . predniSONE (DELTASONE) 5 MG tablet Take 5 mg by mouth daily with breakfast.    . rosuvastatin (CRESTOR) 20 MG tablet Take 20 mg by mouth daily.  5  . triamcinolone cream (KENALOG) 0.1 % APPLY TO AFFECTED AREA AFTER BATHING     No current  facility-administered medications for this encounter.    No Known Allergies  Social History   Socioeconomic History  . Marital status: Married    Spouse name: Not on file  . Number of children: Not on file  . Years of education: Not on file  . Highest education level: Not on file  Occupational History  . Not on file  Tobacco Use  . Smoking status: Never Smoker  . Smokeless tobacco: Never Used  Substance and Sexual Activity  . Alcohol use: Yes    Alcohol/week: 3.0 standard drinks    Types: 2 Shots of liquor, 1 Cans of beer per week  . Drug use: No  . Sexual activity: Not on file  Other Topics Concern  . 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   Social Determinants of Health   Financial Resource Strain:   . Difficulty of Paying Living Expenses: Not on file  Food Insecurity:   . Worried About Programme researcher, broadcasting/film/video in the Last Year: Not on file  . Ran Out of Food in the Last Year: Not on file  Transportation Needs:   . Lack of Transportation (Medical): Not on file  . Lack of Transportation (Non-Medical): Not on file  Physical Activity:   . Days of Exercise per Week: Not on file  . Minutes of Exercise per Session: Not on file  Stress:   . Feeling of Stress : Not on file  Social Connections:   . Frequency of Communication with Friends and Family: Not on file  . Frequency of Social Gatherings with Friends and Family: Not on file  . Attends Religious Services: Not on file  . Active Member of Clubs or Organizations: Not on file  . Attends Banker Meetings: Not on file  . Marital Status: Not on file  Intimate Partner Violence:   . Fear of Current or Ex-Partner: Not on file  . Emotionally Abused: Not on file  . Physically Abused: Not on file  . Sexually Abused: Not on file    Family History  Problem Relation Age of Onset  . Stroke Mother    . Alzheimer's disease Mother   . Alzheimer's disease Father     ROS- All systems are reviewed and negative except as per the HPI above  Physical Exam: Vitals:   03/15/19 1141  BP: (!) 150/70  Pulse: (!) 43  Weight: 80.9 kg  Height: 6\' 1"  (1.854 m)   Wt Readings from Last 3 Encounters:  03/15/19 80.9 kg  12/12/18 80.4 kg  08/15/18 79.7 kg    Labs: Lab Results  Component Value Date   NA 139 12/12/2018   K 4.4 12/12/2018   CL 105 12/12/2018  CO2 27 12/12/2018   GLUCOSE 89 12/12/2018   BUN 13 12/12/2018   CREATININE 0.95 12/12/2018   CALCIUM 9.0 12/12/2018   MG 2.4 12/12/2018   No results found for: INR No results found for: CHOL, HDL, LDLCALC, TRIG   GEN- The patient is well appearing, alert and oriented x 3 today.   Head- normocephalic, atraumatic Eyes-  Sclera clear, conjunctiva pink Ears- hearing intact Oropharynx- clear Neck- supple, no JVP Lymph- no cervical lymphadenopathy Lungs- Clear to ausculation bilaterally, normal work of breathing. No tenderness of chest wall on palpation Heart-slow, regular rate and rhythm, no murmurs, rubs or gallops, PMI not laterally displaced GI- soft, NT, ND, + BS Extremities- no clubbing, cyanosis, or  Mild edema around bilateral ankle area MS- no significant deformity or atrophy Skin- no rash or lesion Psych- euthymic mood, full affect Neuro- strength and sensation are intact  EKG-S brady at 43  bpm, pr int 186 ms, qrs int 104 ms, qtc 434 ms  Epic records reviewed  Echo Study Conclusions  - Left ventricle: The cavity size was normal. Wall thickness was   normal. Systolic function was mildly to moderately reduced. The   estimated ejection fraction was in the range of 40% to 45%. - Mitral valve: There was moderate regurgitation. - Left atrium: The atrium was mildly dilated. - Pulmonary arteries: Systolic pressure was mildly to moderately   increased. PA peak pressure: 39 mm Hg (S).    Assessment and Plan: 1.  Afib In SR with tikosyn Continue dofetilide 250 mcg a day qtc stable Continue apixaban 5 mg bid for chadsvasc score of 3   Bmet/mag today   2. Brady  HR in the 40's which has been his norm in the past He is not symtomatic with this   F/u in 3 months   Butch Penny C. Kuzey Ogata, [facility] [address] [place], [place] [address] [phone]

## 2019-06-13 ENCOUNTER — Ambulatory Visit (HOSPITAL_COMMUNITY)
Admission: RE | Admit: 2019-06-13 | Discharge: 2019-06-13 | Disposition: A | Discharge status: Home / Self Care | Payer: Medicare Other | Source: Ambulatory Visit | Attending: Nurse Practitioner | Admitting: Nurse Practitioner

## 2019-06-13 ENCOUNTER — Other Ambulatory Visit: Payer: Self-pay

## 2019-06-13 ENCOUNTER — Encounter (HOSPITAL_COMMUNITY): Payer: Self-pay | Admitting: Nurse Practitioner

## 2019-06-13 VITALS — BP 158/58 | HR 51 | Ht 73.0 in | Wt 180.6 lb

## 2019-06-13 DIAGNOSIS — Z7901 Long term (current) use of anticoagulants: Secondary | ICD-10-CM | POA: Insufficient documentation

## 2019-06-13 DIAGNOSIS — Z79899 Other long term (current) drug therapy: Secondary | ICD-10-CM | POA: Diagnosis not present

## 2019-06-13 DIAGNOSIS — I4819 Other persistent atrial fibrillation: Secondary | ICD-10-CM | POA: Diagnosis not present

## 2019-06-13 DIAGNOSIS — I4891 Unspecified atrial fibrillation: Secondary | ICD-10-CM

## 2019-06-13 DIAGNOSIS — Z7952 Long term (current) use of systemic steroids: Secondary | ICD-10-CM | POA: Diagnosis not present

## 2019-06-13 DIAGNOSIS — I10 Essential (primary) hypertension: Secondary | ICD-10-CM | POA: Diagnosis not present

## 2019-06-13 DIAGNOSIS — D6869 Other thrombophilia: Secondary | ICD-10-CM

## 2019-06-13 DIAGNOSIS — E785 Hyperlipidemia, unspecified: Secondary | ICD-10-CM | POA: Insufficient documentation

## 2019-06-13 LAB — BASIC METABOLIC PANEL
Anion gap: 7 (ref 5–15)
BUN: 18 mg/dL (ref 8–23)
CO2: 25 mmol/L (ref 22–32)
Calcium: 8.7 mg/dL — ABNORMAL LOW (ref 8.9–10.3)
Chloride: 108 mmol/L (ref 98–111)
Creatinine, Ser: 1.12 mg/dL (ref 0.61–1.24)
GFR calc Af Amer: >60 mL/min (ref >60)
GFR calc non Af Amer: 58 mL/min — ABNORMAL LOW (ref >60)
Glucose, Bld: 132 mg/dL — ABNORMAL HIGH (ref 70–99)
Potassium: 4.9 mmol/L (ref 3.5–5.1)
Sodium: 140 mmol/L (ref 135–145)

## 2019-06-13 LAB — MAGNESIUM: Magnesium: 2.4 mg/dL (ref 1.7–2.4)

## 2019-06-13 NOTE — Progress Notes (Signed)
Primary Care Physician: Geoffry Paradise, MD Referring Physician:Dr. Johney Frame   Sean Bowman is a 86 y.o. male with a h/o persistent afib that was diagnosed in December of 2017. He was started on anticoagulation and weeks later had DCCV which was unsuccessful. At time of cardioversion Cardizem was doubled for better rate control and simvastatin was stopped due to drug drug interaction concerns. He was later started on rosuvastatin.  Echo showed EF of 40-45%. It was decided that he would benefit from restoring SR with tikosyn with probable TMC. He was admitted for Tikosyn initiation.He returned to SR and qtc was stable. He was eventually taken off Cardizem and some ankle edema he had improved and he only takes lasix as needed. Left on low dose BB. BB was held at one point, and had return of afib.  F/u in afib clinic, 03/16/17, for general  f/u of tikosyn. He is staying in SR. He has had all over joint discomfort including the chest, shoulders and back. He saw a specialist and is thought to have some sort of arthritis and is on prednisone which does help with symptoms. No bleeding issues with eliquis.  F/u in afib clinic 4/22,he is doing well staying in SR with Tikosyn.  He has a HR in the 40's, which is chronic but is not symptomatic. 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 No bleeding issues with eliquis 5 mg bid.  F/u in afib clinic 8/28. He feels well. He again has a HR in the upper 40's but is asymptomatic. He is taking eliquis on a regular basis. Being compliant with dofetilide as well.  F/u in afib clinic, 12/14. HE appears to be at his baseline. No afib to report just concerned re the slow HR, from  which he does not appear to be symptomatic. We have discussed in the past trying to stop the BB and today, he would like to pursue this. Reports some light nosebleeds.   F/u in afib clinic, 6/9. He feels well. He is staying in SR. No complaints other than very mild dizziness with  position change. He is not currently  on any medication that could contribute to this.   F/u 12/12/18. He reports that he feels well. No issues with afib. Continues on Tikosyn. No issues with anticoagulation.  F/i in afib clinic 03/15/19. He has not noted any afib. Continues on tikosyn and apixaban 5 mg bid for a CHA2DS2VASc of 3. No issues with bleeding. HR's in the 40's today that has been present in the past. He is not symptomatic nor on rate control.   F/u in afib clinic, 06/13/19.He remains in sinus brady which is  chronic and he tolerates well Has some mild LLE at end of the day that goes away by the next morning. No complaints today. Being compliant with tikosyn and eliquis. No afib to report.   ation.Today, he denies symptoms of palpitations,  shortness of breath, orthopnea, PND  dizziness, presyncope, syncope, or neurologic sequela. The patient is tolerating medications without difficulties and is otherwise without complaint today.   Past Medical History:  Diagnosis Date  . Arthralgia   . Benign positional vertigo   . HTN (hypertension)   . Hyperlipidemia   . Impaired glucose tolerance   . Persistent atrial fibrillation (HCC)   . Senile purpura (HCC)    Past Surgical History:  Procedure Laterality Date  . CARDIOVERSION N/A 03/29/2016   Procedure: CARDIOVERSION;  Surgeon: Thurmon Fair, MD;  Location: [facility]  ENDOSCOPY;  Service: Cardiovascular;  Laterality: N/A;  . CATARACT EXTRACTION W/ INTRAOCULAR LENS  IMPLANT, BILATERAL Bilateral 2017  . ORIF METACARPAL FRACTURE Left ~ 2016   S/P fall  . TONSILLECTOMY      Current Outpatient Medications  Medication Sig Dispense Refill  . apixaban (ELIQUIS) 5 MG TABS tablet Take 1 tablet (5 mg total) by mouth 2 (two) times daily. 180 tablet 3  . dofetilide (TIKOSYN) 250 MCG capsule TAKE 1 CAPSULE (250 MCG TOTAL) BY MOUTH 2 (TWO) TIMES DAILY. 180 capsule 1  . latanoprost (XALATAN) 0.005 % ophthalmic solution Place 1 drop into both eyes at bedtime.     . predniSONE (DELTASONE) 5 MG tablet Take 5 mg by mouth daily with breakfast.    . rosuvastatin (CRESTOR) 20 MG tablet Take 20 mg by mouth daily.  5   No current facility-administered medications for this encounter.    No Known Allergies  Social History   Socioeconomic History  . Marital status: Married    Spouse name: Not on file  . Number of children: Not on file  . Years of education: Not on file  . Highest education level: Not on file  Occupational History  . Not on file  Tobacco Use  . Smoking status: Never Smoker  . Smokeless tobacco: Never Used  Substance and Sexual Activity  . Alcohol use: Yes    Alcohol/week: 3.0 standard drinks    Types: 2 Shots of liquor, 1 Cans of beer per week  . Drug use: No  . Sexual activity: Not on file  Other Topics Concern  . 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   Social Determinants of Health   Financial Resource Strain:   . Difficulty of Paying Living Expenses:   Food Insecurity:   . Worried About Programme researcher, broadcasting/film/video in the Last Year:   . Barista in the Last Year:   Transportation Needs:   . Freight forwarder (Medical):   Marland Kitchen Lack of Transportation (Non-Medical):   Physical Activity:   . Days of Exercise per Week:   . Minutes of Exercise per Session:   Stress:   . Feeling of Stress :   Social Connections:   . Frequency of Communication with Friends and Family:   . Frequency of Social Gatherings with Friends and Family:   . Attends Religious Services:   . Active Member of Clubs or Organizations:   . Attends Banker Meetings:   Marland Kitchen Marital Status:   Intimate Partner Violence:   . Fear of Current or Ex-Partner:   . Emotionally Abused:   Marland Kitchen Physically Abused:   . Sexually Abused:     Family History  Problem Relation Age of Onset  . Stroke Mother   . Alzheimer's disease  Mother   . Alzheimer's disease Father     ROS- All systems are reviewed and negative except as per the HPI above  Physical Exam: Vitals:   06/13/19 0833  BP: (!) 158/58  Pulse: (!) 51  Weight: 81.9 kg  Height: 6\' 1"  (1.854 m)   Wt Readings from Last 3 Encounters:  06/13/19 81.9 kg  03/15/19 80.9 kg  12/12/18 80.4 kg    Labs: Lab Results  Component Value Date   NA 141 03/15/2019   K 4.2 03/15/2019   CL 107 03/15/2019   CO2 29  03/15/2019   GLUCOSE 139 (H) 03/15/2019   BUN 15 03/15/2019   CREATININE 1.11 03/15/2019   CALCIUM 9.1 03/15/2019   MG 2.3 03/15/2019   No results found for: INR No results found for: CHOL, HDL, LDLCALC, TRIG   GEN- The patient is well appearing, alert and oriented x 3 today.   Head- normocephalic, atraumatic Eyes-  Sclera clear, conjunctiva pink Ears- hearing intact Oropharynx- clear Neck- supple, no JVP Lymph- no cervical lymphadenopathy Lungs- Clear to ausculation bilaterally, normal work of breathing. No tenderness of chest wall on palpation Heart-slow, regular rate and rhythm, no murmurs, rubs or gallops, PMI not laterally displaced GI- soft, NT, ND, + BS Extremities- no clubbing, cyanosis, or  Mild edema around bilateral ankle area MS- no significant deformity or atrophy Skin- no rash or lesion Psych- euthymic mood, full affect Neuro- strength and sensation are intact  EKG-S brady at 51 bpm, pr int 186 ms, qrs int 104 ms, qtc 435 ms  Epic records reviewed  Echo Study Conclusions  - Left ventricle: The cavity size was normal. Wall thickness was   normal. Systolic function was mildly to moderately reduced. The   estimated ejection fraction was in the range of 40% to 45%. - Mitral valve: There was moderate regurgitation. - Left atrium: The atrium was mildly dilated. - Pulmonary arteries: Systolic pressure was mildly to moderately   increased. PA peak pressure: 39 mm Hg (S).    Assessment and Plan: 1. Afib In SR with  tikosyn Continue dofetilide 250 mcg a day qtc stable Continue apixaban 5 mg bid for chadsvasc score of 3   Bmet/mag today   2. Brady HR in the 40's/50's which has been his norm in the past He is not symtomatic with this   F/u in 3 months   Butch Penny C. Shila Kruczek, [facility] [address] [place], [place] [address] [phone]

## 2019-06-14 ENCOUNTER — Other Ambulatory Visit (HOSPITAL_COMMUNITY): Payer: Self-pay | Admitting: *Deleted

## 2019-07-10 ENCOUNTER — Encounter: Payer: Self-pay | Admitting: Podiatry

## 2019-07-10 ENCOUNTER — Other Ambulatory Visit: Payer: Self-pay

## 2019-07-10 ENCOUNTER — Ambulatory Visit: Payer: Medicare Other | Admitting: Podiatry

## 2019-07-10 DIAGNOSIS — L84 Corns and callosities: Secondary | ICD-10-CM

## 2019-07-10 DIAGNOSIS — Z7901 Long term (current) use of anticoagulants: Secondary | ICD-10-CM

## 2019-07-13 NOTE — Progress Notes (Signed)
Subjective: 86 year old male presents the office today for concerns of a painful corn to the left fifth toe near the toenail.  Area is painful with pressure in shoes.  Denies any drainage or pus or any swelling or redness. Denies any systemic complaints such as fevers, chills, nausea, vomiting. No acute changes since last appointment, and no other complaints at this time.   He is on Eliquis  Objective: AAO x3, NAD DP/PT pulses palpable bilaterally, CRT less than 3 seconds Hammertoe contractures are present particularly left fifth toe which is rigid.  This is resulting in pressure in a hyperkeratotic lesion to the dorsal lateral IPJ.  There is no underlying ulceration drainage or any signs of infection noted today. No open lesions or pre-ulcerative lesions.  No pain with calf compression, swelling, warmth, erythema  Assessment: Hyperkeratotic lesion due to digital deformity  Plan: -All treatment options discussed with the patient including all alternatives, risks, complications.  -Debrided hyperkeratotic lesions any complications or bleeding.  Continue offloading pads which I dispensed today are discussed shoe modifications. -Patient encouraged to call the office with any questions, concerns, change in symptoms.   Vivi Barrack DPM

## 2019-07-22 ENCOUNTER — Other Ambulatory Visit: Payer: Self-pay | Admitting: Nurse Practitioner

## 2019-08-24 DIAGNOSIS — R7301 Impaired fasting glucose: Secondary | ICD-10-CM | POA: Diagnosis not present

## 2019-08-24 DIAGNOSIS — I10 Essential (primary) hypertension: Secondary | ICD-10-CM | POA: Diagnosis not present

## 2019-08-24 DIAGNOSIS — Z0000 Encounter for general adult medical examination without abnormal findings: Secondary | ICD-10-CM | POA: Diagnosis not present

## 2019-08-24 DIAGNOSIS — E7849 Other hyperlipidemia: Secondary | ICD-10-CM | POA: Diagnosis not present

## 2019-08-28 DIAGNOSIS — H04123 Dry eye syndrome of bilateral lacrimal glands: Secondary | ICD-10-CM | POA: Diagnosis not present

## 2019-08-28 DIAGNOSIS — Z961 Presence of intraocular lens: Secondary | ICD-10-CM | POA: Diagnosis not present

## 2019-08-28 DIAGNOSIS — H18413 Arcus senilis, bilateral: Secondary | ICD-10-CM | POA: Diagnosis not present

## 2019-08-28 DIAGNOSIS — H401131 Primary open-angle glaucoma, bilateral, mild stage: Secondary | ICD-10-CM | POA: Diagnosis not present

## 2019-09-07 DIAGNOSIS — Z1212 Encounter for screening for malignant neoplasm of rectum: Secondary | ICD-10-CM | POA: Diagnosis not present

## 2019-09-10 IMAGING — NM NM MISC PROCEDURE
6 series · 36 of 36 positions shown · non-contrast
Comparison: none

[Series 1: wbr_r-proj_st rest · 6.51mm/px · 6 of 64 frames shown]
[frame 6/64]
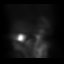
[frame 16/64]
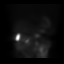
[frame 27/64]
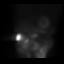
[frame 38/64]
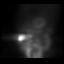
[frame 48/64]
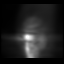
[frame 59/64]
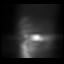

[Series 1: rest · 6.51mm/px · 6 of 64 frames shown]
[frame 6/64]
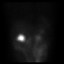
[frame 16/64]
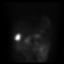
[frame 27/64]
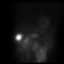
[frame 38/64]
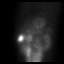
[frame 48/64]
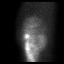
[frame 59/64]
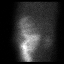

[Series 2: stress · 6.51mm/px · 6 of 64 frames shown (1 of 2)]
[frame 6/64]
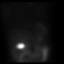
[frame 16/64]
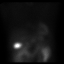
[frame 27/64]
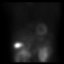
[frame 38/64]
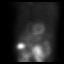
[frame 48/64]
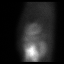
[frame 59/64]
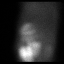

[Series 2: wbr_s-proj_st stress · 6.51mm/px · 6 of 64 frames shown (1 of 2)]
[frame 6/64]
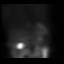
[frame 16/64]
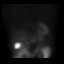
[frame 27/64]
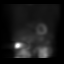
[frame 38/64]
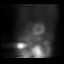
[frame 48/64]
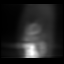
[frame 59/64]
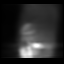

[Series 2: wbr_s-proj_st stress · 6.51mm/px · 6 of 512 frames shown (2 of 2)]
[frame 43/512]
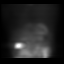
[frame 128/512]
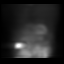
[frame 214/512]
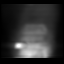
[frame 299/512]
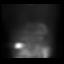
[frame 384/512]
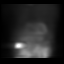
[frame 470/512]
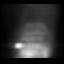

[Series 2: stress · 6.51mm/px · 6 of 512 frames shown (2 of 2)]
[frame 43/512]
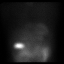
[frame 128/512]
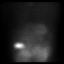
[frame 214/512]
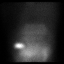
[frame 299/512]
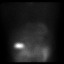
[frame 384/512]
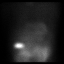
[frame 470/512]
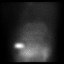

[36 of 36 positions shown; findings below may reference images not displayed]

Canned report from images found in remote index.

Refer to host system for actual result text.

## 2019-09-12 ENCOUNTER — Other Ambulatory Visit: Payer: Self-pay

## 2019-09-12 ENCOUNTER — Ambulatory Visit (HOSPITAL_COMMUNITY)
Admission: RE | Admit: 2019-09-12 | Discharge: 2019-09-12 | Disposition: A | Discharge status: Home / Self Care | Payer: Medicare Other | Source: Ambulatory Visit | Attending: Nurse Practitioner | Admitting: Nurse Practitioner

## 2019-09-12 VITALS — BP 156/76 | HR 43 | Ht 73.0 in | Wt 174.0 lb

## 2019-09-12 DIAGNOSIS — R001 Bradycardia, unspecified: Secondary | ICD-10-CM | POA: Diagnosis not present

## 2019-09-12 DIAGNOSIS — Z79899 Other long term (current) drug therapy: Secondary | ICD-10-CM | POA: Diagnosis not present

## 2019-09-12 DIAGNOSIS — D6869 Other thrombophilia: Secondary | ICD-10-CM

## 2019-09-12 DIAGNOSIS — I4891 Unspecified atrial fibrillation: Secondary | ICD-10-CM | POA: Diagnosis not present

## 2019-09-12 DIAGNOSIS — Z7901 Long term (current) use of anticoagulants: Secondary | ICD-10-CM | POA: Diagnosis not present

## 2019-09-12 LAB — BASIC METABOLIC PANEL
Anion gap: 7 (ref 5–15)
BUN: 15 mg/dL (ref 8–23)
CO2: 26 mmol/L (ref 22–32)
Calcium: 8.8 mg/dL — ABNORMAL LOW (ref 8.9–10.3)
Chloride: 105 mmol/L (ref 98–111)
Creatinine, Ser: 0.96 mg/dL (ref 0.61–1.24)
GFR calc Af Amer: >60 mL/min (ref >60)
GFR calc non Af Amer: >60 mL/min (ref >60)
Glucose, Bld: 117 mg/dL — ABNORMAL HIGH (ref 70–99)
Potassium: 4.1 mmol/L (ref 3.5–5.1)
Sodium: 138 mmol/L (ref 135–145)

## 2019-09-12 LAB — MAGNESIUM: Magnesium: 2.3 mg/dL (ref 1.7–2.4)

## 2019-09-12 NOTE — Progress Notes (Addendum)
Primary Care Physician: Geoffry Paradise, MD Referring Physician:Dr. Johney Frame   Sean Bowman is a 86 y.o. male with a h/o persistent afib that was diagnosed in December of 2017. He was started on anticoagulation and weeks later had DCCV which was unsuccessful. At time of cardioversion Cardizem was doubled for better rate control and simvastatin was stopped due to drug drug interaction concerns. He was later started on rosuvastatin.  Echo showed EF of 40-45%. It was decided that he would benefit from restoring SR with tikosyn with probable TMC. He was admitted for Tikosyn initiation.He returned to SR and qtc was stable. He was eventually taken off Cardizem and some ankle edema he had improved and he only takes lasix as needed. Left on low dose BB. BB was held at one point, and had return of afib.  F/u in afib clinic, 03/16/17, for general  f/u of tikosyn. He is staying in SR. He has had all over joint discomfort including the chest, shoulders and back. He saw a specialist and is thought to have some sort of arthritis and is on prednisone which does help with symptoms. No bleeding issues with eliquis.  F/u in afib clinic 4/22,he is doing well staying in SR with Tikosyn.  He has a HR in the 40's, which is chronic but is not symptomatic. 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 No bleeding issues with eliquis 5 mg bid.  F/u in afib clinic 8/28. He feels well. He again has a HR in the upper 40's but is asymptomatic. He is taking eliquis on a regular basis. Being compliant with dofetilide as well.  F/u in afib clinic, 12/14. HE appears to be at his baseline. No afib to report just concerned re the slow HR, from  which he does not appear to be symptomatic. We have discussed in the past trying to stop the BB and today, he would like to pursue this. Reports some light nosebleeds.   F/u in afib clinic, 6/9. He feels well. He is staying in SR. No complaints other than very mild dizziness with  position change. He is not currently  on any medication that could contribute to this.   F/u 12/12/18. He reports that he feels well. No issues with afib. Continues on Tikosyn. No issues with anticoagulation.  F/i in afib clinic 03/15/19. He has not noted any afib. Continues on tikosyn and apixaban 5 mg bid for a CHA2DS2VASc of 3. No issues with bleeding. HR's in the 40's today that has been present in the past. He is not symptomatic nor on rate control.   F/u in afib clinic, 06/13/19.He remains in sinus brady which is  chronic and he tolerates well Has some mild LLE at end of the day that goes away by the next morning. No complaints today. Being compliant with tikosyn and eliquis. No afib to report.   F/u afib clinic, 09/12/19. No afib to report. Has a resting HR in the 40's but when walked down the hall, the HR picked up appropriately  to the mid 60's. He is not symptomatic with the bradycardia. The bradycardia is symptomatic and he is now on any AV nodal blocking drugs. He feels well. He will soon turn 89. No issues with DOAC, takes Tikosyn on a regular basis. He has both covid shots.    Today, he denies symptoms of palpitations,  shortness of breath, orthopnea, PND  dizziness, presyncope, syncope, or neurologic sequela. The patient is tolerating medications without difficulties  and is otherwise without complaint today.   Past Medical History:  Diagnosis Date   Arthralgia    Benign positional vertigo    HTN (hypertension)    Hyperlipidemia    Impaired glucose tolerance    Persistent atrial fibrillation (HCC)    Senile purpura (HCC)    Past Surgical History:  Procedure Laterality Date   CARDIOVERSION N/A 03/29/2016   Procedure: CARDIOVERSION;  Surgeon: Thurmon Fair, MD;  Location: [facility];  Service: Cardiovascular;  Laterality: N/A;   CATARACT EXTRACTION W/ INTRAOCULAR LENS  IMPLANT, BILATERAL Bilateral 2017   ORIF METACARPAL FRACTURE Left ~ 2016   S/P fall   TONSILLECTOMY       Current Outpatient Medications  Medication Sig Dispense Refill   apixaban (ELIQUIS) 5 MG TABS tablet Take 1 tablet (5 mg total) by mouth 2 (two) times daily. 180 tablet 3   dofetilide (TIKOSYN) 250 MCG capsule TAKE 1 CAPSULE (250 MCG TOTAL) BY MOUTH 2 (TWO) TIMES DAILY. 180 capsule 1   latanoprost (XALATAN) 0.005 % ophthalmic solution Place 1 drop into both eyes at bedtime.     rosuvastatin (CRESTOR) 20 MG tablet Take 20 mg by mouth daily.  5   No current facility-administered medications for this encounter.    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   Social Determinants of Health   Financial Resource Strain:    Difficulty of Paying Living Expenses:   Food Insecurity:    Worried About Programme researcher, broadcasting/film/video in the Last Year:    Barista in the Last Year:   Transportation Needs:    Freight forwarder (Medical):    Lack of Transportation (Non-Medical):   Physical Activity:    Days of Exercise per Week:    Minutes of Exercise per Session:   Stress:    Feeling of Stress :   Social Connections:    Frequency of Communication with Friends and Family:    Frequency of Social Gatherings with Friends and Family:    Attends Religious Services:    Active Member of Clubs or  Organizations:    Attends Engineer, structural:    Marital Status:   Intimate Partner Violence:    Fear of Current or Ex-Partner:    Emotionally Abused:    Physically Abused:    Sexually Abused:     Family History  Problem Relation Age of Onset   Stroke Mother    Alzheimer's disease Mother    Alzheimer's disease Father     ROS- All systems are reviewed and negative except as per the HPI above  Physical Exam: Vitals:   09/12/19 1147  Weight: 78.9 kg  Height:  6\' 1"  (1.854 m)   Wt Readings from Last 3 Encounters:  09/12/19 78.9 kg  06/13/19 81.9 kg  03/15/19 80.9 kg    Labs: Lab Results  Component Value Date   NA 140 06/13/2019   K 4.9 06/13/2019   CL 108 06/13/2019   CO2 25 06/13/2019   GLUCOSE 132 (H) 06/13/2019   BUN 18 06/13/2019   CREATININE 1.12 06/13/2019   CALCIUM 8.7 (L) 06/13/2019   MG 2.4 06/13/2019   No results found for: INR No results found for: CHOL, HDL, LDLCALC, TRIG   GEN- The patient is well appearing, alert and oriented x 3 today.   Head- normocephalic, atraumatic Eyes-  Sclera clear, conjunctiva pink Ears- hearing intact Oropharynx- clear Neck- supple, no JVP Lymph- no cervical lymphadenopathy Lungs- Clear to ausculation bilaterally, normal work of breathing. No tenderness of chest wall on palpation Heart-slow, regular rate and rhythm, no murmurs, rubs or gallops, PMI not laterally displaced GI- soft, NT, ND, + BS Extremities- no clubbing, cyanosis, or  Mild edema around bilateral ankle area MS- no significant deformity or atrophy Skin- no rash or lesion Psych- euthymic mood, full affect Neuro- strength and sensation are intact  EKG- marked sinus brady at 43 bpm, PR int 170 ms, qrs int 90 ms, qtc 415 ms Epic records reviewed  Echo Study Conclusions  - Left ventricle: The cavity size was normal. Wall thickness was   normal. Systolic function was mildly to moderately reduced. The   estimated ejection fraction  was in the range of 40% to 45%. - Mitral valve: There was moderate regurgitation. - Left atrium: The atrium was mildly dilated. - Pulmonary arteries: Systolic pressure was mildly to moderately   increased. PA peak pressure: 39 mm Hg (S).    Assessment and Plan: 1. Afib In SR with tikosyn Continue dofetilide 250 mcg a day qtc stable Continue apixaban 5 mg bid for chadsvasc score of 3   Bmet/mag today   2. Bradycardia  HR in the 40's/50's which has been his norm in the past He is not symtomatic with this HR increased appropriately into the mid  60's with walking    F/u in 3 months   08/13/2019 [facility] [address] [place], [place] [phone]

## 2019-09-17 ENCOUNTER — Encounter (HOSPITAL_COMMUNITY): Payer: Self-pay | Admitting: Nurse Practitioner

## 2019-10-09 ENCOUNTER — Ambulatory Visit (INDEPENDENT_AMBULATORY_CARE_PROVIDER_SITE_OTHER): Payer: Medicare Other | Admitting: Dermatology

## 2019-10-09 ENCOUNTER — Encounter: Payer: Self-pay | Admitting: Dermatology

## 2019-10-09 ENCOUNTER — Other Ambulatory Visit: Payer: Self-pay

## 2019-10-09 DIAGNOSIS — L570 Actinic keratosis: Secondary | ICD-10-CM

## 2019-11-23 NOTE — Progress Notes (Signed)
   Follow-Up Visit   Subjective  Sean Bowman is a 86 y.o. male who presents for the following: No chief complaint on file..   Location:  Duration:  Quality:  Associated Signs/Symptoms: Modifying Factors:  Severity:  Timing: Context:   Objective  Well appearing patient in no apparent distress; mood and affect are within normal limits.  Patient patient left office before being seen   Assessment & Plan    AK (actinic keratosis) Head - Anterior (Face)      I, Janalyn Harder, MD, have reviewed all documentation for this visit.  The documentation on 11/23/19 for the exam, diagnosis, procedures, and orders are all accurate and complete.

## 2019-12-10 DIAGNOSIS — Z23 Encounter for immunization: Secondary | ICD-10-CM | POA: Diagnosis not present

## 2019-12-12 ENCOUNTER — Ambulatory Visit: Payer: Medicare Other | Admitting: Dermatology

## 2019-12-17 ENCOUNTER — Other Ambulatory Visit: Payer: Self-pay

## 2019-12-17 ENCOUNTER — Encounter (HOSPITAL_COMMUNITY): Payer: Self-pay | Admitting: Nurse Practitioner

## 2019-12-17 ENCOUNTER — Ambulatory Visit (HOSPITAL_COMMUNITY)
Admission: RE | Admit: 2019-12-17 | Discharge: 2019-12-17 | Disposition: A | Discharge status: Home / Self Care | Payer: Medicare Other | Source: Ambulatory Visit | Attending: Nurse Practitioner | Admitting: Nurse Practitioner

## 2019-12-17 VITALS — BP 158/72 | HR 51 | Ht 73.0 in | Wt 174.8 lb

## 2019-12-17 DIAGNOSIS — D6869 Other thrombophilia: Secondary | ICD-10-CM | POA: Diagnosis not present

## 2019-12-17 DIAGNOSIS — I10 Essential (primary) hypertension: Secondary | ICD-10-CM | POA: Insufficient documentation

## 2019-12-17 DIAGNOSIS — Z79811 Long term (current) use of aromatase inhibitors: Secondary | ICD-10-CM | POA: Diagnosis not present

## 2019-12-17 DIAGNOSIS — I4891 Unspecified atrial fibrillation: Secondary | ICD-10-CM | POA: Diagnosis not present

## 2019-12-17 DIAGNOSIS — Z7901 Long term (current) use of anticoagulants: Secondary | ICD-10-CM | POA: Insufficient documentation

## 2019-12-17 DIAGNOSIS — I4819 Other persistent atrial fibrillation: Secondary | ICD-10-CM | POA: Diagnosis not present

## 2019-12-17 DIAGNOSIS — R001 Bradycardia, unspecified: Secondary | ICD-10-CM | POA: Diagnosis not present

## 2019-12-17 DIAGNOSIS — Z79899 Other long term (current) drug therapy: Secondary | ICD-10-CM | POA: Insufficient documentation

## 2019-12-17 DIAGNOSIS — R42 Dizziness and giddiness: Secondary | ICD-10-CM | POA: Diagnosis not present

## 2019-12-17 DIAGNOSIS — E785 Hyperlipidemia, unspecified: Secondary | ICD-10-CM | POA: Diagnosis not present

## 2019-12-17 LAB — BASIC METABOLIC PANEL
Anion gap: 9 (ref 5–15)
BUN: 16 mg/dL (ref 8–23)
CO2: 24 mmol/L (ref 22–32)
Calcium: 8.9 mg/dL (ref 8.9–10.3)
Chloride: 106 mmol/L (ref 98–111)
Creatinine, Ser: 1.02 mg/dL (ref 0.61–1.24)
GFR, Estimated: >60 mL/min (ref >60)
Glucose, Bld: 115 mg/dL — ABNORMAL HIGH (ref 70–99)
Potassium: 4.5 mmol/L (ref 3.5–5.1)
Sodium: 139 mmol/L (ref 135–145)

## 2019-12-17 LAB — MAGNESIUM: Magnesium: 2.2 mg/dL (ref 1.7–2.4)

## 2019-12-17 NOTE — Progress Notes (Signed)
Primary Care Physician: Geoffry Paradise, MD Referring Physician:Dr. Johney Frame   DAIEL STROHECKER is a 86 y.o. male with a h/o persistent afib that was diagnosed in December of 2017. He was started on anticoagulation and weeks later had DCCV which was unsuccessful. At time of cardioversion Cardizem was doubled for better rate control and simvastatin was stopped due to drug drug interaction concerns. He was later started on rosuvastatin.  Echo showed EF of 40-45%. It was decided that he would benefit from restoring SR with tikosyn with probable TMC. He was admitted for Tikosyn initiation.He returned to SR and qtc was stable. He was eventually taken off Cardizem and some ankle edema he had improved and he only takes lasix as needed. Left on low dose BB. BB was held at one point, and had return of afib.  F/u in afib clinic, 03/16/17, for general  f/u of tikosyn. He is staying in SR. He has had all over joint discomfort including the chest, shoulders and back. He saw a specialist and is thought to have some sort of arthritis and is on prednisone which does help with symptoms. No bleeding issues with eliquis.  F/u in afib clinic 4/22,he is doing well staying in SR with Tikosyn.  He has a HR in the 40's, which is chronic but is not symptomatic. 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 No bleeding issues with eliquis 5 mg bid.  F/u in afib clinic 8/28. He feels well. He again has a HR in the upper 40's but is asymptomatic. He is taking eliquis on a regular basis. Being compliant with dofetilide as well.  F/u in afib clinic, 12/14. HE appears to be at his baseline. No afib to report just concerned re the slow HR, from  which he does not appear to be symptomatic. We have discussed in the past trying to stop the BB and today, he would like to pursue this. Reports some light nosebleeds.   F/u in afib clinic, 6/9. He feels well. He is staying in SR. No complaints other than very mild dizziness with  position change. He is not currently  on any medication that could contribute to this.   F/u 12/12/18. He reports that he feels well. No issues with afib. Continues on Tikosyn. No issues with anticoagulation.  F/i in afib clinic 03/15/19. He has not noted any afib. Continues on tikosyn and apixaban 5 mg bid for a CHA2DS2VASc of 3. No issues with bleeding. HR's in the 40's today that has been present in the past. He is not symptomatic nor on rate control.   F/u in afib clinic, 06/13/19.He remains in sinus brady which is  chronic and he tolerates well Has some mild LLE at end of the day that goes away by the next morning. No complaints today. Being compliant with tikosyn and eliquis. No afib to report.   F/u afib clinic, 09/12/19. No afib to report. Has a resting HR in the 40's but when walked down the hall, the HR picked up appropriately  to the mid 60's. He is not symptomatic with the bradycardia. The bradycardia is symptomatic and he is now on any AV nodal blocking drugs. He feels well. He will soon turn 89. No issues with DOAC, takes Tikosyn on a regular basis. He has both covid shots.    F/u in afib clinic, 12/17/19. Remains in sinus brady at 51 bpm and reports no afib awareness. He has c/o of dizziness from time to time  but is stating that is more noticeable lately. This is with sitting and standing. He is not on any AV nodal blocking drugs. His BP is elevated today, he does not monitor his HR or BP at home. I did walk him last time he was in the clinic and his HR did pick up appropriately with exercise.  Continues on eliquis, no bleeding issues, CHA2DS2VASc score of 3.   Today, he denies symptoms of palpitations,  shortness of breath, orthopnea, PND  dizziness, presyncope, syncope, or neurologic sequela. The patient is tolerating medications without difficulties and is otherwise without complaint today.   Past Medical History:  Diagnosis Date  . Arthralgia   . Benign positional vertigo   . HTN  (hypertension)   . Hyperlipidemia   . Impaired glucose tolerance   . Persistent atrial fibrillation (HCC)   . Senile purpura (HCC)    Past Surgical History:  Procedure Laterality Date  . CARDIOVERSION N/A 03/29/2016   Procedure: CARDIOVERSION;  Surgeon: Thurmon Fair, MD;  Location: [facility];  Service: Cardiovascular;  Laterality: N/A;  . CATARACT EXTRACTION W/ INTRAOCULAR LENS  IMPLANT, BILATERAL Bilateral 2017  . ORIF METACARPAL FRACTURE Left ~ 2016   S/P fall  . TONSILLECTOMY      Current Outpatient Medications  Medication Sig Dispense Refill  . apixaban (ELIQUIS) 5 MG TABS tablet Take 1 tablet (5 mg total) by mouth 2 (two) times daily. 180 tablet 3  . dofetilide (TIKOSYN) 250 MCG capsule TAKE 1 CAPSULE (250 MCG TOTAL) BY MOUTH 2 (TWO) TIMES DAILY. 180 capsule 1  . latanoprost (XALATAN) 0.005 % ophthalmic solution Place 1 drop into both eyes at bedtime.    . rosuvastatin (CRESTOR) 20 MG tablet Take 20 mg by mouth daily.  5   No current facility-administered medications for this encounter.    No Known Allergies  Social History   Socioeconomic History  . Marital status: Married    Spouse name: Not on file  . Number of children: Not on file  . Years of education: Not on file  . Highest education level: Not on file  Occupational History  . Not on file  Tobacco Use  . Smoking status: Never Smoker  . Smokeless tobacco: Never Used  Substance and Sexual Activity  . Alcohol use: Yes    Alcohol/week: 3.0 standard drinks    Types: 2 Shots of liquor, 1 Cans of beer per week  . Drug use: No  . Sexual activity: Not on file  Other Topics Concern  . 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   Social Determinants of Health   Financial Resource Strain:   . Difficulty of Paying Living Expenses: Not on file  Food Insecurity:   . Worried About Patent examiner in the Last Year: Not on file  . Ran Out of Food in the Last Year: Not on file  Transportation Needs:   . Lack of Transportation (Medical): Not on file  . Lack of Transportation (Non-Medical): Not on file  Physical Activity:   . Days of Exercise per Week: Not on file  . Minutes of Exercise per Session: Not on file  Stress:   . Feeling of Stress : Not on file  Social Connections:   . Frequency of Communication with Friends and Family: Not on file  . Frequency of Social Gatherings with Friends and Family:  Not on file  . Attends Religious Services: Not on file  . Active Member of Clubs or Organizations: Not on file  . Attends Banker Meetings: Not on file  . Marital Status: Not on file  Intimate Partner Violence:   . Fear of Current or Ex-Partner: Not on file  . Emotionally Abused: Not on file  . Physically Abused: Not on file  . Sexually Abused: Not on file    Family History  Problem Relation Age of Onset  . Stroke Mother   . Alzheimer's disease Mother   . Alzheimer's disease Father     ROS- All systems are reviewed and negative except as per the HPI above  Physical Exam: Vitals:   12/17/19 0833  Weight: 79.3 kg  Height: 6\' 1"  (1.854 m)   Wt Readings from Last 3 Encounters:  12/17/19 79.3 kg  09/12/19 78.9 kg  06/13/19 81.9 kg    Labs: Lab Results  Component Value Date   NA 138 09/12/2019   K 4.1 09/12/2019   CL 105 09/12/2019   CO2 26 09/12/2019   GLUCOSE 117 (H) 09/12/2019   BUN 15 09/12/2019   CREATININE 0.96 09/12/2019   CALCIUM 8.8 (L) 09/12/2019   MG 2.3 09/12/2019   No results found for: INR No results found for: CHOL, HDL, LDLCALC, TRIG   GEN- The patient is well appearing, alert and oriented x 3 today.   Head- normocephalic, atraumatic Eyes-  Sclera clear, conjunctiva pink Ears- hearing intact Oropharynx- clear Neck- supple, no JVP Lymph- no cervical lymphadenopathy Lungs- Clear to ausculation bilaterally, normal work of  breathing. No tenderness of chest wall on palpation Heart-slow, regular rate and rhythm, no murmurs, rubs or gallops, PMI not laterally displaced GI- soft, NT, ND, + BS Extremities- no clubbing, cyanosis, or  Mild edema around bilateral ankle area MS- no significant deformity or atrophy Skin- no rash or lesion Psych- euthymic mood, full affect Neuro- strength and sensation are intact  EKG-  sinus brady at 51 bpm, PR int 168 ms, qrs int 92 ms, qtc 418 ms Epic records reviewed  Echo Study Conclusions  - Left ventricle: The cavity size was normal. Wall thickness was   normal. Systolic function was mildly to moderately reduced. The   estimated ejection fraction was in the range of 40% to 45%. - Mitral valve: There was moderate regurgitation. - Left atrium: The atrium was mildly dilated. - Pulmonary arteries: Systolic pressure was mildly to moderately   increased. PA peak pressure: 39 mm Hg (S).    Assessment and Plan: 1. Afib In S brady  with tikosyn, no afib appreciated  Continue dofetilide 250 mcg a day qtc stable Continue apixaban 5 mg bid for chadsvasc score of 3   Bmet/mag today   2. Bradycardia/ ? SSS  HR in the 40's/50's which has been his norm in the past He states that lightheadedness has been more noticeable with both  sitting and standing  3. HTN Not on any HTN agents Had been on low dose BB but this was stopped a while back for bradycardia Elevated on presentation at 158/72 Rechecked at 142 /79 He was given a BP cuff with instructions on how to use to track HR/BP to see if any correlation with his symptoms and to see if he needed BP meds without chronotropic effect   I will request a f/u with Dr. [id number] to discuss if PPM is indicated at this time    F/u in 4 months  Geroge Baseman Taffy Delconte, [facility] [address] [place],  [address] [phone]

## 2020-01-07 ENCOUNTER — Ambulatory Visit: Payer: Medicare Other | Admitting: Internal Medicine

## 2020-01-07 ENCOUNTER — Other Ambulatory Visit: Payer: Self-pay | Admitting: Nurse Practitioner

## 2020-01-07 ENCOUNTER — Other Ambulatory Visit: Payer: Self-pay

## 2020-01-07 ENCOUNTER — Encounter: Payer: Self-pay | Admitting: *Deleted

## 2020-01-07 ENCOUNTER — Encounter: Payer: Self-pay | Admitting: Internal Medicine

## 2020-01-07 VITALS — BP 152/68 | HR 53 | Ht 73.0 in | Wt 172.8 lb

## 2020-01-07 DIAGNOSIS — R42 Dizziness and giddiness: Secondary | ICD-10-CM

## 2020-01-07 DIAGNOSIS — I4819 Other persistent atrial fibrillation: Secondary | ICD-10-CM

## 2020-01-07 DIAGNOSIS — I428 Other cardiomyopathies: Secondary | ICD-10-CM | POA: Diagnosis not present

## 2020-01-07 DIAGNOSIS — R001 Bradycardia, unspecified: Secondary | ICD-10-CM | POA: Diagnosis not present

## 2020-01-07 DIAGNOSIS — R079 Chest pain, unspecified: Secondary | ICD-10-CM

## 2020-01-07 NOTE — Progress Notes (Signed)
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  Since last being seen in our clinic, the patient reports doing very well.  He is active for his age and continues to work.  He has rare (every few weeks ) transient dizziness/ presyncope.  Denies syncope.  Energy is preserved.  AF is controlled.  He does have occasional chest "heaviness" but denies exertional pain.  Today, he denies symptoms of palpitations, shortness of breath, or  lower extremity edema.  The patient is otherwise without complaint today.   Past Medical History:  Diagnosis Date  . Arthralgia   . Benign positional vertigo   . HTN (hypertension)   . Hyperlipidemia   . Impaired glucose tolerance   . Persistent atrial fibrillation (HCC)   . Senile purpura (HCC)    Past Surgical History:  Procedure Laterality Date  . CARDIOVERSION N/A 03/29/2016   Procedure: CARDIOVERSION;  Surgeon: Thurmon Fair, MD;  Location: [facility];  Service: Cardiovascular;  Laterality: N/A;  . CATARACT EXTRACTION W/ INTRAOCULAR LENS  IMPLANT, BILATERAL Bilateral 2017  . ORIF METACARPAL FRACTURE Left ~ 2016   S/P fall  . TONSILLECTOMY      ROS- all systems are reviewed and negatives except as per HPI above  Current Outpatient Medications  Medication Sig Dispense Refill  . apixaban (ELIQUIS) 5 MG TABS tablet Take 1 tablet (5 mg total) by mouth 2 (two) times daily. 180 tablet 3  . dofetilide (TIKOSYN) 250 MCG capsule TAKE 1 CAPSULE (250 MCG TOTAL) BY MOUTH 2 (TWO) TIMES DAILY. 180 capsule 1  . latanoprost (XALATAN) 0.005 % ophthalmic solution Place 1 drop into both eyes at bedtime.    . rosuvastatin (CRESTOR) 20 MG tablet Take 20 mg by mouth daily.  5   No current facility-administered medications for this visit.    Physical Exam: Vitals:   01/07/20 0922  BP: (!) 152/68  Pulse: (!) 53  SpO2: 96%  Weight: 172 lb 12.8 oz (78.4 kg)  Height: 6'  1" (1.854 m)    GEN- The patient is well appearing, alert and oriented x 3 today.   Head- normocephalic, atraumatic Eyes-  Sclera clear, conjunctiva pink Ears- hearing intact Oropharynx- clear Lungs- Clear to ausculation bilaterally, normal work of breathing Heart- Regular rate and rhythm, no murmurs, rubs or gallops, PMI not laterally displaced GI- soft, NT, ND, + BS Extremities- no clubbing, cyanosis, or edema  Echo 12/06/2016- EF 50-55%, mild MR  Wt Readings from Last 3 Encounters:  01/07/20 172 lb 12.8 oz (78.4 kg)  12/17/19 174 lb 12.8 oz (79.3 kg)  09/12/19 174 lb (78.9 kg)    EKG tracing ordered today is personally reviewed and shows sinus bradycardia, 53 bpm, qtc 405 msec  Assessment and Plan:  1. Sinus bradycardia He has frequent dizziness and fatigue with heart rates documented to be 40s-50s. The patient has symptomatic bradycardia.  I have therefore offered pacemaker implantation at this time.  Risks, benefits, alternatives to pacemaker implantation were discussed in detail with the patient today. At this time, he is clear that he wishes to avoid PPM implantation.  He may reconsider if his symptoms worsen.  Given his advanced age, this is a reasonable approach.  2. Persistent afib chads2vasc score is 3.  He is on eliquis afib has been well controlled on [employer], mg are reviewed He requires close follow-up on this medicine to avoid toxicity  3. HTN Stable No change required today  4. HL On crestor  5. Chest pain Both typical and atypical features Prior myoview and echo are reviewed He is concerned We will repeat myoview at this time   Risks, benefits and potential toxicities for medications prescribed and/or refilled reviewed with patient today.   Return to AF clinic in 3 months  Hillis Range MD, [facility] 01/07/2020 9:33 AM

## 2020-01-07 NOTE — Patient Instructions (Addendum)
Medication Instructions:  Your physician recommends that you continue on your current medications as directed. Please refer to the Current Medication list given to you today.  *If you need a refill on your cardiac medications before your next appointment, please call your pharmacy*  Lab Work: None ordered.  If you have labs (blood work) drawn today and your tests are completely normal, you will receive your results only by: Marland Kitchen MyChart Message (if you have MyChart) OR . A paper copy in the mail If you have any lab test that is abnormal or we need to change your treatment, we will call you to review the results.  Testing/Procedures: Your physician has requested that you have a lexiscan myoview. For further information please visit [email or web address] Please follow instruction sheet, as given.   Follow-Up: At [facility], you and your health needs are our priority.  As part of our continuing mission to provide you with exceptional heart care, we have created designated Provider Care Teams.  These Care Teams include your primary Cardiologist (physician) and Advanced Practice Providers (APPs -  Physician Assistants and Nurse Practitioners) who all work together to provide you with the care you need, when you need it.  We recommend signing up for the patient portal called "MyChart".  Sign up information is provided on this After Visit Summary.  MyChart is used to connect with patients for Virtual Visits (Telemedicine).  Patients are able to view lab/test results, encounter notes, upcoming appointments, etc.  Non-urgent messages can be sent to your provider as well.   To learn more about what you can do with MyChart, go to [email or web address].    Your next appointment:   Your physician wants you to follow-up in: 3 months with [facility], they will reach out to you to schedule.    Other Instructions:

## 2020-01-16 ENCOUNTER — Telehealth (HOSPITAL_COMMUNITY): Payer: Self-pay | Admitting: *Deleted

## 2020-01-16 NOTE — Telephone Encounter (Signed)
Patient given detailed instructions per Myocardial Perfusion Study Information Sheet for the test on  01/23/20. Patient notified to arrive 15 minutes early and that it is imperative to arrive on time for appointment to keep from having the test rescheduled. ° If you need to cancel or reschedule your appointment, please call the office within 24 hours of your appointment. . Patient verbalized understanding. Sean Bowman Jacqueline ° ° ° °

## 2020-01-23 ENCOUNTER — Ambulatory Visit (HOSPITAL_COMMUNITY): Payer: Medicare Other | Attending: Cardiovascular Disease

## 2020-01-23 ENCOUNTER — Other Ambulatory Visit: Payer: Self-pay

## 2020-01-23 DIAGNOSIS — R42 Dizziness and giddiness: Secondary | ICD-10-CM | POA: Diagnosis not present

## 2020-01-23 DIAGNOSIS — I4819 Other persistent atrial fibrillation: Secondary | ICD-10-CM | POA: Diagnosis not present

## 2020-01-23 DIAGNOSIS — R001 Bradycardia, unspecified: Secondary | ICD-10-CM | POA: Diagnosis not present

## 2020-01-23 DIAGNOSIS — I428 Other cardiomyopathies: Secondary | ICD-10-CM | POA: Diagnosis not present

## 2020-01-23 DIAGNOSIS — R079 Chest pain, unspecified: Secondary | ICD-10-CM

## 2020-01-23 LAB — MYOCARDIAL PERFUSION IMAGING
LV dias vol: 126 mL (ref 62–150)
LV sys vol: 58 mL
Peak HR: 123 {beats}/min
Rest HR: 70 {beats}/min
SDS: 3
SRS: 0
SSS: 3
TID: 0.98

## 2020-01-23 MED ORDER — TECHNETIUM TC 99M TETROFOSMIN IV KIT
10.1000 | PACK | Freq: Once | INTRAVENOUS | Status: AC | PRN
  Administered 2020-01-23: 10.1 via INTRAVENOUS
  Filled 2020-01-23: qty 11

## 2020-01-23 MED ORDER — REGADENOSON 0.4 MG/5ML IV SOLN
0.4000 mg | Freq: Once | INTRAVENOUS | Status: AC
  Administered 2020-01-23: 0.4 mg via INTRAVENOUS

## 2020-01-23 MED ORDER — TECHNETIUM TC 99M TETROFOSMIN IV KIT
30.4000 | PACK | Freq: Once | INTRAVENOUS | Status: AC | PRN
  Administered 2020-01-23: 30.4 via INTRAVENOUS
  Filled 2020-01-23: qty 31

## 2020-01-30 ENCOUNTER — Telehealth: Payer: Self-pay | Admitting: Internal Medicine

## 2020-01-30 NOTE — Telephone Encounter (Signed)
Sean Bowman is returning Sean Bowman's call in regards to his results. He states he will not be back in office until 2 pm and he will be leaving at 6 pm. Please advise.

## 2020-01-30 NOTE — Telephone Encounter (Signed)
Hillis Range, MD  01/28/2020 9:02 PM EST     Results reviewed. Inetta Fermo, please inform pt of low risk result. I will route to primary care also.   The patient has been notified of the result and verbalized understanding.  All questions (if any) were answered. Loa Socks, LPN [id number] 82:99 PM

## 2020-02-25 DIAGNOSIS — I10 Essential (primary) hypertension: Secondary | ICD-10-CM | POA: Diagnosis not present

## 2020-02-25 DIAGNOSIS — M2550 Pain in unspecified joint: Secondary | ICD-10-CM | POA: Diagnosis not present

## 2020-02-25 DIAGNOSIS — R7301 Impaired fasting glucose: Secondary | ICD-10-CM | POA: Diagnosis not present

## 2020-02-25 DIAGNOSIS — M791 Myalgia, unspecified site: Secondary | ICD-10-CM | POA: Diagnosis not present

## 2020-04-04 DIAGNOSIS — H401131 Primary open-angle glaucoma, bilateral, mild stage: Secondary | ICD-10-CM | POA: Diagnosis not present

## 2020-04-04 DIAGNOSIS — H18413 Arcus senilis, bilateral: Secondary | ICD-10-CM | POA: Diagnosis not present

## 2020-04-04 DIAGNOSIS — Z961 Presence of intraocular lens: Secondary | ICD-10-CM | POA: Diagnosis not present

## 2020-04-04 DIAGNOSIS — H04123 Dry eye syndrome of bilateral lacrimal glands: Secondary | ICD-10-CM | POA: Diagnosis not present

## 2020-04-09 ENCOUNTER — Encounter (HOSPITAL_COMMUNITY): Payer: Self-pay | Admitting: Nurse Practitioner

## 2020-04-09 ENCOUNTER — Ambulatory Visit (HOSPITAL_COMMUNITY)
Admission: RE | Admit: 2020-04-09 | Discharge: 2020-04-09 | Disposition: A | Discharge status: Home / Self Care | Payer: Medicare Other | Source: Ambulatory Visit | Attending: Nurse Practitioner | Admitting: Nurse Practitioner

## 2020-04-09 ENCOUNTER — Other Ambulatory Visit: Payer: Self-pay

## 2020-04-09 VITALS — BP 154/76 | HR 50 | Ht 73.0 in | Wt 171.0 lb

## 2020-04-09 DIAGNOSIS — Z7901 Long term (current) use of anticoagulants: Secondary | ICD-10-CM | POA: Insufficient documentation

## 2020-04-09 DIAGNOSIS — R001 Bradycardia, unspecified: Secondary | ICD-10-CM | POA: Insufficient documentation

## 2020-04-09 DIAGNOSIS — I10 Essential (primary) hypertension: Secondary | ICD-10-CM | POA: Insufficient documentation

## 2020-04-09 DIAGNOSIS — I4819 Other persistent atrial fibrillation: Secondary | ICD-10-CM | POA: Insufficient documentation

## 2020-04-09 DIAGNOSIS — I4891 Unspecified atrial fibrillation: Secondary | ICD-10-CM | POA: Diagnosis not present

## 2020-04-09 DIAGNOSIS — Z79899 Other long term (current) drug therapy: Secondary | ICD-10-CM | POA: Insufficient documentation

## 2020-04-09 DIAGNOSIS — D6869 Other thrombophilia: Secondary | ICD-10-CM

## 2020-04-09 LAB — BASIC METABOLIC PANEL
Anion gap: 9 (ref 5–15)
BUN: 18 mg/dL (ref 8–23)
CO2: 28 mmol/L (ref 22–32)
Calcium: 8.9 mg/dL (ref 8.9–10.3)
Chloride: 103 mmol/L (ref 98–111)
Creatinine, Ser: 1.1 mg/dL (ref 0.61–1.24)
GFR, Estimated: >60 mL/min (ref >60)
Glucose, Bld: 77 mg/dL (ref 70–99)
Potassium: 4.2 mmol/L (ref 3.5–5.1)
Sodium: 140 mmol/L (ref 135–145)

## 2020-04-09 LAB — MAGNESIUM: Magnesium: 2.4 mg/dL (ref 1.7–2.4)

## 2020-04-09 NOTE — Progress Notes (Signed)
Primary Care Physician: Geoffry Paradise, MD Referring Physician:Dr. Johney Frame   Sean Bowman is a 87 y.o. male with a h/o persistent afib that was diagnosed in December of 2017. He was started on anticoagulation and weeks later had DCCV which was unsuccessful. At time of cardioversion Cardizem was doubled for better rate control and simvastatin was stopped due to drug drug interaction concerns. He was later started on rosuvastatin.  Echo showed EF of 40-45%. It was decided that he would benefit from restoring SR with tikosyn with probable TMC. He was admitted for Tikosyn initiation.He returned to SR and qtc was stable. He was eventually taken off Cardizem and some ankle edema he had improved and he only takes lasix as needed. Left on low dose BB. BB was held at one point, and had return of afib.  F/u in afib clinic, 03/16/17, for general  f/u of tikosyn. He is staying in SR. He has had all over joint discomfort including the chest, shoulders and back. He saw a specialist and is thought to have some sort of arthritis and is on prednisone which does help with symptoms. No bleeding issues with eliquis.  F/u in afib clinic 4/22,he is doing well staying in SR with Tikosyn.  He has a HR in the 40's, which is chronic but is not symptomatic. 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 No bleeding issues with eliquis 5 mg bid.  F/u in afib clinic 8/28. He feels well. He again has a HR in the upper 40's but is asymptomatic. He is taking eliquis on a regular basis. Being compliant with dofetilide as well.  F/u in afib clinic, 12/14. HE appears to be at his baseline. No afib to report just concerned re the slow HR, from  which he does not appear to be symptomatic. We have discussed in the past trying to stop the BB and today, he would like to pursue this. Reports some light nosebleeds.   F/u in afib clinic, 6/9. He feels well. He is staying in SR. No complaints other than very mild dizziness with  position change. He is not currently  on any medication that could contribute to this.   F/u 12/12/18. He reports that he feels well. No issues with afib. Continues on Tikosyn. No issues with anticoagulation.  F/i in afib clinic 03/15/19. He has not noted any afib. Continues on tikosyn and apixaban 5 mg bid for a CHA2DS2VASc of 3. No issues with bleeding. HR's in the 40's today that has been present in the past. He is not symptomatic nor on rate control.   F/u in afib clinic, 06/13/19.He remains in sinus brady which is  chronic and he tolerates well Has some mild LLE at end of the day that goes away by the next morning. No complaints today. Being compliant with tikosyn and eliquis. No afib to report.   F/u afib clinic, 09/12/19. No afib to report. Has a resting HR in the 40's but when walked down the hall, the HR picked up appropriately  to the mid 60's. He is not symptomatic with the bradycardia. The bradycardia is symptomatic and he is now on any AV nodal blocking drugs. He feels well. He will soon turn 89. No issues with DOAC, takes Tikosyn on a regular basis. He has both covid shots.    F/u in afib clinic, 12/17/19. Remains in sinus brady at 51 bpm and reports no afib awareness. He has c/o of dizziness from time to time  but is stating that is more noticeable lately. This is with sitting and standing. He is not on any AV nodal blocking drugs. His BP is elevated today, he does not monitor his HR or BP at home. I did walk him last time he was in the clinic and his HR did pick up appropriately with exercise.  Continues on eliquis, no bleeding issues, CHA2DS2VASc score of 3.   F/u in afib clinic 04/09/20. He saw Dr. Johney Frame in November and pt had a low risk stress test. Dr. Johney Frame also offered him a PPM, with his low HR's and  intermittent dizziness. Pt declined. He remains in SR with Tikosyn. EKg shows sinus brady at 50 bpm today. He has not noted much dizziness lately. He continues to  work. Qtc stable.    Today, he denies symptoms of palpitations,  shortness of breath, orthopnea, PND  dizziness, presyncope, syncope, or neurologic sequela. The patient is tolerating medications without difficulties and is otherwise without complaint today.   Past Medical History:  Diagnosis Date  . Arthralgia   . Benign positional vertigo   . HTN (hypertension)   . Hyperlipidemia   . Impaired glucose tolerance   . Persistent atrial fibrillation (HCC)   . Senile purpura (HCC)    Past Surgical History:  Procedure Laterality Date  . CARDIOVERSION N/A 03/29/2016   Procedure: CARDIOVERSION;  Surgeon: Thurmon Fair, MD;  Location: [facility];  Service: Cardiovascular;  Laterality: N/A;  . CATARACT EXTRACTION W/ INTRAOCULAR LENS  IMPLANT, BILATERAL Bilateral 2017  . ORIF METACARPAL FRACTURE Left ~ 2016   S/P fall  . TONSILLECTOMY      Current Outpatient Medications  Medication Sig Dispense Refill  . apixaban (ELIQUIS) 5 MG TABS tablet Take 1 tablet (5 mg total) by mouth 2 (two) times daily. 180 tablet 3  . dofetilide (TIKOSYN) 250 MCG capsule TAKE 1 CAPSULE (250 MCG TOTAL) BY MOUTH 2 (TWO) TIMES DAILY. 180 capsule 1  . latanoprost (XALATAN) 0.005 % ophthalmic solution Place 1 drop into both eyes at bedtime.    . rosuvastatin (CRESTOR) 20 MG tablet Take 20 mg by mouth daily.  5   No current facility-administered medications for this encounter.    No Known Allergies  Social History   Socioeconomic History  . Marital status: Married    Spouse name: Not on file  . Number of children: Not on file  . Years of education: Not on file  . Highest education level: Not on file  Occupational History  . Not on file  Tobacco Use  . Smoking status: Never Smoker  . Smokeless tobacco: Never Used  Substance and Sexual Activity  . Alcohol use: Yes    Alcohol/week: 3.0 standard drinks    Types: 2 Shots of liquor, 1 Cans of beer per week  . Drug use: No  . Sexual activity: Not on file  Other Topics Concern   . 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   Social Determinants of Health   Financial Resource Strain: Not on file  Food Insecurity: Not on file  Transportation Needs: Not on file  Physical Activity: Not on file  Stress: Not on file  Social Connections: Not on file  Intimate Partner Violence: Not on file    Family History  Problem Relation Age of Onset  . Stroke Mother   . Alzheimer's disease Mother   .  Alzheimer's disease Father     ROS- All systems are reviewed and negative except as per the HPI above  Physical Exam: Vitals:   04/09/20 1027  Weight: 77.6 kg  Height: 6\' 1"  (1.854 m)   Wt Readings from Last 3 Encounters:  04/09/20 77.6 kg  01/23/20 78 kg  01/07/20 78.4 kg    Labs: Lab Results  Component Value Date   NA 139 12/17/2019   K 4.5 12/17/2019   CL 106 12/17/2019   CO2 24 12/17/2019   GLUCOSE 115 (H) 12/17/2019   BUN 16 12/17/2019   CREATININE 1.02 12/17/2019   CALCIUM 8.9 12/17/2019   MG 2.2 12/17/2019   No results found for: INR No results found for: CHOL, HDL, LDLCALC, TRIG   GEN- The patient is well appearing, alert and oriented x 3 today.   Head- normocephalic, atraumatic Eyes-  Sclera clear, conjunctiva pink Ears- hearing intact Oropharynx- clear Neck- supple, no JVP Lymph- no cervical lymphadenopathy Lungs- Clear to ausculation bilaterally, normal work of breathing. No tenderness of chest wall on palpation Heart-slow, regular rate and rhythm, no murmurs, rubs or gallops, PMI not laterally displaced GI- soft, NT, ND, + BS Extremities- no clubbing, cyanosis, or  Mild edema around bilateral ankle area MS- no significant deformity or atrophy Skin- no rash or lesion Psych- euthymic mood, full affect Neuro- strength and sensation are intact  EKG- Sinus/ brady Vent. rate 50 BPM PR interval 166 ms QRS duration 96  ms QT/QTc 472/430 ms P-R-T axes 30 27 43  Echo Study Conclusions  - Left ventricle: The cavity size was normal. Wall thickness was   normal. Systolic function was mildly to moderately reduced. The   estimated ejection fraction was in the range of 40% to 45%. - Mitral valve: There was moderate regurgitation. - Left atrium: The atrium was mildly dilated. - Pulmonary arteries: Systolic pressure was mildly to moderately   increased. PA peak pressure: 39 mm Hg (S).    Assessment and Plan: 1. Afib In S brady  with tikosyn, no afib appreciated per pt  Continue dofetilide 250 mcg a day qtc stable Continue apixaban 5 mg bid for chadsvasc score of 3   Bmet/mag today   2. Bradycardia  Remains slow but minimally symptomatic Dr. 02/16/2020 offered him a PPM on last visit last November  and he declined   3. HTN Stable for pt   F/u in afib clinic in 3 months    December C. Lupita Leash [facility] [address] [place], [place] [phone]

## 2020-07-11 ENCOUNTER — Other Ambulatory Visit: Payer: Self-pay | Admitting: Nurse Practitioner

## 2020-07-24 ENCOUNTER — Ambulatory Visit (HOSPITAL_COMMUNITY)
Admission: RE | Admit: 2020-07-24 | Discharge: 2020-07-24 | Disposition: A | Discharge status: Home / Self Care | Payer: Medicare Other | Source: Ambulatory Visit | Attending: Nurse Practitioner | Admitting: Nurse Practitioner

## 2020-07-24 ENCOUNTER — Other Ambulatory Visit: Payer: Self-pay

## 2020-07-24 ENCOUNTER — Encounter (HOSPITAL_COMMUNITY): Payer: Self-pay | Admitting: Nurse Practitioner

## 2020-07-24 VITALS — BP 144/68 | HR 134 | Ht 73.0 in | Wt 170.8 lb

## 2020-07-24 DIAGNOSIS — J069 Acute upper respiratory infection, unspecified: Secondary | ICD-10-CM | POA: Diagnosis not present

## 2020-07-24 DIAGNOSIS — D6869 Other thrombophilia: Secondary | ICD-10-CM | POA: Diagnosis not present

## 2020-07-24 DIAGNOSIS — Z79899 Other long term (current) drug therapy: Secondary | ICD-10-CM | POA: Diagnosis not present

## 2020-07-24 DIAGNOSIS — I4819 Other persistent atrial fibrillation: Secondary | ICD-10-CM | POA: Diagnosis not present

## 2020-07-24 DIAGNOSIS — R058 Other specified cough: Secondary | ICD-10-CM | POA: Diagnosis not present

## 2020-07-24 DIAGNOSIS — Z7901 Long term (current) use of anticoagulants: Secondary | ICD-10-CM | POA: Diagnosis not present

## 2020-07-24 DIAGNOSIS — I4891 Unspecified atrial fibrillation: Secondary | ICD-10-CM | POA: Diagnosis not present

## 2020-07-24 DIAGNOSIS — R0981 Nasal congestion: Secondary | ICD-10-CM | POA: Insufficient documentation

## 2020-07-24 DIAGNOSIS — R5383 Other fatigue: Secondary | ICD-10-CM | POA: Diagnosis not present

## 2020-07-24 DIAGNOSIS — I10 Essential (primary) hypertension: Secondary | ICD-10-CM | POA: Insufficient documentation

## 2020-07-24 DIAGNOSIS — Z1152 Encounter for screening for COVID-19: Secondary | ICD-10-CM | POA: Diagnosis not present

## 2020-07-24 MED ORDER — METOPROLOL TARTRATE 25 MG PO TABS: 12.5000 mg | ORAL_TABLET | Freq: Two times a day (BID) | ORAL | 1 refills | Status: DC

## 2020-07-24 NOTE — Patient Instructions (Signed)
Start metoprolol 1/2 tablet twice a day   

## 2020-07-24 NOTE — Progress Notes (Signed)
Primary Care Physician: Geoffry Paradise, MD Referring Physician:Dr. Johney Frame   Sean Bowman is a 87 y.o. male with a h/o persistent afib that was diagnosed in December of 2017. He was started on anticoagulation and weeks later had DCCV which was unsuccessful. At time of cardioversion Cardizem was doubled for better rate control and simvastatin was stopped due to drug drug interaction concerns. He was later started on rosuvastatin.  Echo showed EF of 40-45%. It was decided that he would benefit from restoring SR with tikosyn with probable TMC. He was admitted for Tikosyn initiation.He returned to SR and qtc was stable. He was eventually taken off Cardizem and some ankle edema he had improved and he only takes lasix as needed. Left on low dose BB. BB was held at one point, and had return of afib.  F/u in afib clinic, 03/16/17, for general  f/u of tikosyn. He is staying in SR. He has had all over joint discomfort including the chest, shoulders and back. He saw a specialist and is thought to have some sort of arthritis and is on prednisone which does help with symptoms. No bleeding issues with eliquis.  F/u in afib clinic 4/22,he is doing well staying in SR with Tikosyn.  He has a HR in the 40's, which is chronic but is not symptomatic. 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 No bleeding issues with eliquis 5 mg bid.  F/u in afib clinic 8/28. He feels well. He again has a HR in the upper 40's but is asymptomatic. He is taking eliquis on a regular basis. Being compliant with dofetilide as well.  F/u in afib clinic, 12/14. HE appears to be at his baseline. No afib to report just concerned re the slow HR, from  which he does not appear to be symptomatic. We have discussed in the past trying to stop the BB and today, he would like to pursue this. Reports some light nosebleeds.   F/u in afib clinic, 6/9. He feels well. He is staying in SR. No complaints other than very mild dizziness with  position change. He is not currently  on any medication that could contribute to this.   F/u 12/12/18. He reports that he feels well. No issues with afib. Continues on Tikosyn. No issues with anticoagulation.  F/i in afib clinic 03/15/19. He has not noted any afib. Continues on tikosyn and apixaban 5 mg bid for a CHA2DS2VASc of 3. No issues with bleeding. HR's in the 40's today that has been present in the past. He is not symptomatic nor on rate control.   F/u in afib clinic, 06/13/19.He remains in sinus brady which is  chronic and he tolerates well Has some mild LLE at end of the day that goes away by the next morning. No complaints today. Being compliant with tikosyn and eliquis. No afib to report.   F/u afib clinic, 09/12/19. No afib to report. Has a resting HR in the 40's but when walked down the hall, the HR picked up appropriately  to the mid 60's. He is not symptomatic with the bradycardia. The bradycardia is symptomatic and he is now on any AV nodal blocking drugs. He feels well. He will soon turn 89. No issues with DOAC, takes Tikosyn on a regular basis. He has both covid shots.    F/u in afib clinic, 12/17/19. Remains in sinus brady at 51 bpm and reports no afib awareness. He has c/o of dizziness from time to time  but is stating that is more noticeable lately. This is with sitting and standing. He is not on any AV nodal blocking drugs. His BP is elevated today, he does not monitor his HR or BP at home. I did walk him last time he was in the clinic and his HR did pick up appropriately with exercise.  Continues on eliquis, no bleeding issues, CHA2DS2VASc score of 3.   F/u in afib clinic 04/09/20. He saw Dr. Johney Frame in November and pt had a low risk stress test. Dr. Johney Frame also offered him a PPM, with his low HR's and  intermittent dizziness. Pt declined. He remains in SR with Tikosyn. EKg shows sinus brady at 50 bpm today. He has not noted much dizziness lately. He continues to  work. Qtc stable.   F/u  with afib clinic, 07/24/20. Ekg shows afib with rvr at 134 bpm.  He was unaware. He states that he came down suddenly with fatigue and head congestion 2 days ago. He denies fever, cough, shortness of breath with exertion. PO 97%.  He has been using OC decongestants.  CHA2DS2VASc score of 3. He continues on eliquis. He has not spoken to PCP re recent symptoms.   Today, he denies symptoms of palpitations,  shortness of breath, orthopnea, PND  dizziness, presyncope, syncope, or neurologic sequela. The patient is tolerating medications without difficulties and is otherwise without complaint today.   Past Medical History:  Diagnosis Date  . Arthralgia   . Benign positional vertigo   . HTN (hypertension)   . Hyperlipidemia   . Impaired glucose tolerance   . Persistent atrial fibrillation (HCC)   . Senile purpura (HCC)    Past Surgical History:  Procedure Laterality Date  . CARDIOVERSION N/A 03/29/2016   Procedure: CARDIOVERSION;  Surgeon: Thurmon Fair, MD;  Location: [facility];  Service: Cardiovascular;  Laterality: N/A;  . CATARACT EXTRACTION W/ INTRAOCULAR LENS  IMPLANT, BILATERAL Bilateral 2017  . ORIF METACARPAL FRACTURE Left ~ 2016   S/P fall  . TONSILLECTOMY      Current Outpatient Medications  Medication Sig Dispense Refill  . apixaban (ELIQUIS) 5 MG TABS tablet Take 1 tablet (5 mg total) by mouth 2 (two) times daily. 180 tablet 3  . dofetilide (TIKOSYN) 250 MCG capsule TAKE 1 CAPSULE BY MOUTH 2 TIMES DAILY. 180 capsule 1  . latanoprost (XALATAN) 0.005 % ophthalmic solution Place 1 drop into both eyes at bedtime.    . rosuvastatin (CRESTOR) 20 MG tablet Take 20 mg by mouth daily.  5   No current facility-administered medications for this encounter.    No Known Allergies  Social History   Socioeconomic History  . Marital status: Married    Spouse name: Not on file  . Number of children: Not on file  . Years of education: Not on file  . Highest education level: Not on  file  Occupational History  . Not on file  Tobacco Use  . Smoking status: Never Smoker  . Smokeless tobacco: Never Used  Substance and Sexual Activity  . Alcohol use: Yes    Alcohol/week: 3.0 standard drinks    Types: 2 Shots of liquor, 1 Cans of beer per week  . Drug use: No  . Sexual activity: Not on file  Other Topics Concern  . 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   Social Determinants of Health  Financial Resource Strain: Not on file  Food Insecurity: Not on file  Transportation Needs: Not on file  Physical Activity: Not on file  Stress: Not on file  Social Connections: Not on file  Intimate Partner Violence: Not on file    Family History  Problem Relation Age of Onset  . Stroke Mother   . Alzheimer's disease Mother   . Alzheimer's disease Father     ROS- All systems are reviewed and negative except as per the HPI above  Physical Exam: Vitals:   07/24/20 0834  BP: (!) 144/68  Pulse: (!) 134  Weight: 77.5 kg  Height: 6\' 1"  (1.854 m)   Wt Readings from Last 3 Encounters:  07/24/20 77.5 kg  04/09/20 77.6 kg  01/23/20 78 kg    Labs: Lab Results  Component Value Date   NA 140 04/09/2020   K 4.2 04/09/2020   CL 103 04/09/2020   CO2 28 04/09/2020   GLUCOSE 77 04/09/2020   BUN 18 04/09/2020   CREATININE 1.10 04/09/2020   CALCIUM 8.9 04/09/2020   MG 2.4 04/09/2020   No results found for: INR No results found for: CHOL, HDL, LDLCALC, TRIG   GEN- The patient is well appearing, alert and oriented x 3 today.   Head- normocephalic, atraumatic Eyes-  Sclera clear, conjunctiva pink Ears- hearing intact Oropharynx- clear Neck- supple, no JVP Lymph- no cervical lymphadenopathy Lungs- Clear to ausculation bilaterally, normal work of breathing. No tenderness of chest wall on palpation Heart-irregular  regular rate and rhythm, no  murmurs, rubs or gallops, PMI not laterally displaced GI- soft, NT, ND, + BS Extremities- no clubbing, cyanosis, or  Mild edema around bilateral ankle area MS- no significant deformity or atrophy Skin- no rash or lesion Psych- euthymic mood, full affect Neuro- strength and sensation are intact  EKG-  afib at 134 bpm, qrs int 90 ms, qtc 483 ms    Assessment and Plan: 1. Afib RVR present tody in the setting of URI,  We discussed going to the ER, would like to defer, appears to be stable  He states that he is not in any any distress  Continue dofetilide 250 mcg a day Add low  dose metoprolol 25 mg 1/2 tab bid for rate control Have to be careful with BB as he will be slow in SR when he converts and will likely need to be d/c at that time  Qtc stable Continue apixaban 5 mg bid for chadsvasc score of 3    2. Acute onset of fatigue, nasal congestion  Present for 2 days  Question Covid, he will call PCP today to see about covid testing  Avoid decongestants   3. HTN Stable for pt   F/u tomorrow for recheck    06/07/2020 C. Lupita Leash [facility] [address] [place], [place] [phone]

## 2020-07-25 ENCOUNTER — Ambulatory Visit (HOSPITAL_COMMUNITY)
Admission: RE | Admit: 2020-07-25 | Discharge: 2020-07-25 | Disposition: A | Discharge status: Home / Self Care | Payer: Medicare Other | Source: Ambulatory Visit | Attending: Physician Assistant | Admitting: Physician Assistant

## 2020-07-25 VITALS — BP 120/60 | HR 50 | Ht 73.0 in | Wt 171.6 lb

## 2020-07-25 DIAGNOSIS — Z79899 Other long term (current) drug therapy: Secondary | ICD-10-CM | POA: Diagnosis not present

## 2020-07-25 DIAGNOSIS — D6869 Other thrombophilia: Secondary | ICD-10-CM | POA: Diagnosis not present

## 2020-07-25 DIAGNOSIS — I4819 Other persistent atrial fibrillation: Secondary | ICD-10-CM | POA: Insufficient documentation

## 2020-07-25 LAB — BASIC METABOLIC PANEL
Anion gap: 4 — ABNORMAL LOW (ref 5–15)
BUN: 16 mg/dL (ref 8–23)
CO2: 26 mmol/L (ref 22–32)
Calcium: 8.7 mg/dL — ABNORMAL LOW (ref 8.9–10.3)
Chloride: 108 mmol/L (ref 98–111)
Creatinine, Ser: 1.44 mg/dL — ABNORMAL HIGH (ref 0.61–1.24)
GFR, Estimated: 46 mL/min — ABNORMAL LOW (ref >60)
Glucose, Bld: 179 mg/dL — ABNORMAL HIGH (ref 70–99)
Potassium: 4.1 mmol/L (ref 3.5–5.1)
Sodium: 138 mmol/L (ref 135–145)

## 2020-07-25 LAB — CBC
HCT: 36.9 % — ABNORMAL LOW (ref 39.0–52.0)
Hemoglobin: 12.1 g/dL — ABNORMAL LOW (ref 13.0–17.0)
MCH: 33.1 pg (ref 26.0–34.0)
MCHC: 32.8 g/dL (ref 30.0–36.0)
MCV: 100.8 fL — ABNORMAL HIGH (ref 80.0–100.0)
Platelets: 192 10*3/uL (ref 150–400)
RBC: 3.66 MIL/uL — ABNORMAL LOW (ref 4.22–5.81)
RDW: 13.9 % (ref 11.5–15.5)
WBC: 9.2 10*3/uL (ref 4.0–10.5)
nRBC: 0 % (ref 0.0–0.2)

## 2020-07-25 LAB — MAGNESIUM: Magnesium: 2.3 mg/dL (ref 1.7–2.4)

## 2020-07-25 MED ORDER — METOPROLOL TARTRATE 25 MG PO TABS: ORAL_TABLET | ORAL | Status: DC

## 2020-07-25 NOTE — Progress Notes (Signed)
Patient returns for ECG after starting BB. He is back in SR. ECG shows SB HR 50, PR 166, QRS 94, QTc 443. Will change BB to 12.5 mg daily PRN for heart racing. F/u in the AF clinic in 3 months. Of note, he was tested for COVID-19 and was negative.

## 2020-07-25 NOTE — Addendum Note (Signed)
Encounter addended by: Learta Codding, CMA on: 07/25/2020 12:23 PM  Actions taken: Order list changed

## 2020-08-15 ENCOUNTER — Other Ambulatory Visit (HOSPITAL_COMMUNITY): Payer: Self-pay | Admitting: Nurse Practitioner

## 2020-09-03 DIAGNOSIS — R7301 Impaired fasting glucose: Secondary | ICD-10-CM | POA: Diagnosis not present

## 2020-09-03 DIAGNOSIS — E785 Hyperlipidemia, unspecified: Secondary | ICD-10-CM | POA: Diagnosis not present

## 2020-09-03 DIAGNOSIS — Z125 Encounter for screening for malignant neoplasm of prostate: Secondary | ICD-10-CM | POA: Diagnosis not present

## 2020-09-10 DIAGNOSIS — R82998 Other abnormal findings in urine: Secondary | ICD-10-CM | POA: Diagnosis not present

## 2020-10-13 ENCOUNTER — Other Ambulatory Visit (HOSPITAL_COMMUNITY): Payer: Self-pay | Admitting: *Deleted

## 2020-10-13 MED ORDER — METOPROLOL TARTRATE 25 MG PO TABS
ORAL_TABLET | ORAL | 0 refills | Status: DC
Start: 2020-10-13 — End: 2020-11-05

## 2020-10-28 ENCOUNTER — Ambulatory Visit (HOSPITAL_COMMUNITY): Payer: Medicare Other | Admitting: Nurse Practitioner

## 2020-11-05 ENCOUNTER — Encounter (HOSPITAL_COMMUNITY): Payer: Self-pay | Admitting: Nurse Practitioner

## 2020-11-05 ENCOUNTER — Other Ambulatory Visit: Payer: Self-pay

## 2020-11-05 ENCOUNTER — Ambulatory Visit (HOSPITAL_COMMUNITY)
Admission: RE | Admit: 2020-11-05 | Discharge: 2020-11-05 | Disposition: A | Discharge status: Home / Self Care | Payer: Medicare Other | Source: Ambulatory Visit | Attending: Nurse Practitioner | Admitting: Nurse Practitioner

## 2020-11-05 VITALS — BP 130/78 | HR 134 | Ht 73.0 in | Wt 173.4 lb

## 2020-11-05 DIAGNOSIS — I10 Essential (primary) hypertension: Secondary | ICD-10-CM | POA: Diagnosis not present

## 2020-11-05 DIAGNOSIS — Z7901 Long term (current) use of anticoagulants: Secondary | ICD-10-CM | POA: Insufficient documentation

## 2020-11-05 DIAGNOSIS — Z79899 Other long term (current) drug therapy: Secondary | ICD-10-CM | POA: Diagnosis not present

## 2020-11-05 DIAGNOSIS — I4892 Unspecified atrial flutter: Secondary | ICD-10-CM

## 2020-11-05 DIAGNOSIS — I4819 Other persistent atrial fibrillation: Secondary | ICD-10-CM | POA: Diagnosis not present

## 2020-11-05 DIAGNOSIS — D6869 Other thrombophilia: Secondary | ICD-10-CM | POA: Diagnosis not present

## 2020-11-05 LAB — MAGNESIUM: Magnesium: 2.2 mg/dL (ref 1.7–2.4)

## 2020-11-05 LAB — BASIC METABOLIC PANEL
Anion gap: 6 (ref 5–15)
BUN: 19 mg/dL (ref 8–23)
CO2: 24 mmol/L (ref 22–32)
Calcium: 9.1 mg/dL (ref 8.9–10.3)
Chloride: 109 mmol/L (ref 98–111)
Creatinine, Ser: 1.14 mg/dL (ref 0.61–1.24)
GFR, Estimated: >60 mL/min (ref >60)
Glucose, Bld: 154 mg/dL — ABNORMAL HIGH (ref 70–99)
Potassium: 4.6 mmol/L (ref 3.5–5.1)
Sodium: 139 mmol/L (ref 135–145)

## 2020-11-05 LAB — CBC
HCT: 38.3 % — ABNORMAL LOW (ref 39.0–52.0)
Hemoglobin: 12.8 g/dL — ABNORMAL LOW (ref 13.0–17.0)
MCH: 33.4 pg (ref 26.0–34.0)
MCHC: 33.4 g/dL (ref 30.0–36.0)
MCV: 100 fL (ref 80.0–100.0)
Platelets: 189 10*3/uL (ref 150–400)
RBC: 3.83 MIL/uL — ABNORMAL LOW (ref 4.22–5.81)
RDW: 15.1 % (ref 11.5–15.5)
WBC: 6.6 10*3/uL (ref 4.0–10.5)
nRBC: 0 % (ref 0.0–0.2)

## 2020-11-05 MED ORDER — METOPROLOL TARTRATE 25 MG PO TABS
12.5000 mg | ORAL_TABLET | Freq: Two times a day (BID) | ORAL | 0 refills | Status: DC
Start: 2020-11-05 — End: 2020-11-11

## 2020-11-05 NOTE — Patient Instructions (Signed)
Start metoprolol 1/2 tablet twice a day   

## 2020-11-05 NOTE — Progress Notes (Signed)
Primary Care Physician: Geoffry Paradise, MD Referring Physician:Dr. Johney Frame   Sean Bowman is a 87 y.o. male with a h/o persistent afib that was diagnosed in December of 2017. He was started on anticoagulation and weeks later had DCCV which was unsuccessful. At time of cardioversion Cardizem was doubled for better rate control and simvastatin was stopped due to drug drug interaction concerns. He was later started on rosuvastatin.  Echo showed EF of 40-45%. It was decided that he would benefit from restoring SR with tikosyn with probable TMC. He was admitted for Tikosyn initiation.He returned to SR and qtc was stable. He was eventually taken off Cardizem and some ankle edema he had improved and he only takes lasix as needed. Left on low dose BB. BB was held at one point, and had return of afib.  F/u in afib clinic, 03/16/17, for general  f/u of tikosyn. He is staying in SR. He has had all over joint discomfort including the chest, shoulders and back. He saw a specialist and is thought to have some sort of arthritis and is on prednisone which does help with symptoms. No bleeding issues with eliquis.  F/u in afib clinic 4/22,he is doing well staying in SR with Tikosyn.  He has a HR in the 40's, which is chronic but is not symptomatic. 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 No bleeding issues with eliquis 5 mg bid.  F/u in afib clinic 8/28. He feels well. He again has a HR in the upper 40's but is asymptomatic. He is taking eliquis on a regular basis. Being compliant with dofetilide as well.  F/u in afib clinic, 12/14. He appears to be at his baseline. No afib to report just concerned re the slow HR, from  which he does not appear to be symptomatic. We have discussed in the past trying to stop the BB and today, he would like to pursue this. Reports some light nosebleeds.   F/u in afib clinic, 6/9. He feels well. He is staying in SR. No complaints other than very mild dizziness with  position change. He is not currently  on any medication that could contribute to this.   F/u 12/12/18. He reports that he feels well. No issues with afib. Continues on Tikosyn. No issues with anticoagulation.  F/i in afib clinic 03/15/19. He has not noted any afib. Continues on tikosyn and apixaban 5 mg bid for a CHA2DS2VASc of 3. No issues with bleeding. HR's in the 40's today that has been present in the past. He is not symptomatic nor on rate control.   F/u in afib clinic, 06/13/19.He remains in sinus brady which is  chronic and he tolerates well Has some mild LLE at end of the day that goes away by the next morning. No complaints today. Being compliant with tikosyn and eliquis. No afib to report.   F/u afib clinic, 09/12/19. No afib to report. Has a resting HR in the 40's but when walked down the hall, the HR picked up appropriately  to the mid 60's. He is not symptomatic with the bradycardia. The bradycardia is symptomatic and he is now on any AV nodal blocking drugs. He feels well. He will soon turn 89. No issues with DOAC, takes Tikosyn on a regular basis. He has both covid shots.    F/u in afib clinic, 12/17/19. Remains in sinus brady at 51 bpm and reports no afib awareness. He has c/o of dizziness from time to time  but is stating that is more noticeable lately. This is with sitting and standing. He is not on any AV nodal blocking drugs. His BP is elevated today, he does not monitor his HR or BP at home. I did walk him last time he was in the clinic and his HR did pick up appropriately with exercise.  Continues on eliquis, no bleeding issues, CHA2DS2VASc score of 3.   F/u in afib clinic 04/09/20. He saw Dr. Johney Frame in November and pt had a low risk stress test. Dr. Johney Frame also offered him a PPM, with his low HR's and  intermittent dizziness. Pt declined. He remains in SR with Tikosyn. EKg shows sinus brady at 50 bpm today. He has not noted much dizziness lately. He continues to  work. Qtc stable.   F/u  with afib clinic, 07/24/20. Ekg shows afib with rvr at 134 bpm.  He was unaware. He states that he came down suddenly with fatigue and head congestion 2 days ago. He denies fever, cough, shortness of breath with exertion. PO 97%.  He has been using OC decongestants.  CHA2DS2VASc score of 3. He continues on eliquis. He has not spoken to PCP re recent symptoms.   F/u  in afib clinic, 11/05/20. He did convert to SR with  BB added after last visit in May when he was asymptomatic with afib with RVR. He was sick at the time. After he converted , BB was again made prn, as he is slow in SR. Today EKG shows atrial flutter with RVR at 135 bpm. He is not really aware of being out of rhythm but feels he may have been off for the last several weeks. 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 Denies any recent sickness.he continues on tikosyn and eliquis.   Today, he denies symptoms of palpitations,  shortness of breath, orthopnea, PND  dizziness, presyncope, syncope, or neurologic sequela. The patient is tolerating medications without difficulties and is otherwise without complaint today.   Past Medical History:  Diagnosis Date   Arthralgia    Benign positional vertigo    HTN (hypertension)    Hyperlipidemia    Impaired glucose tolerance    Persistent atrial fibrillation (HCC)    Senile purpura (HCC)    Past Surgical History:  Procedure Laterality Date   CARDIOVERSION N/A 03/29/2016   Procedure: CARDIOVERSION;  Surgeon: Thurmon Fair, MD;  Location: [facility];  Service: Cardiovascular;  Laterality: N/A;   CATARACT EXTRACTION W/ INTRAOCULAR LENS  IMPLANT, BILATERAL Bilateral 2017   ORIF METACARPAL FRACTURE Left ~ 2016   S/P fall   TONSILLECTOMY      Current Outpatient Medications  Medication Sig Dispense Refill   apixaban (ELIQUIS) 5 MG TABS tablet Take 1  tablet (5 mg total) by mouth 2 (two) times daily. 180 tablet 3   dofetilide (TIKOSYN) 250 MCG capsule TAKE 1 CAPSULE BY MOUTH 2 TIMES DAILY. 180 capsule 1   latanoprost (XALATAN) 0.005 % ophthalmic solution Place 1 drop into both eyes at bedtime.     metoprolol tartrate (LOPRESSOR) 25 MG tablet Take 1/2 tablet by mouth as needed for heart racing 15 tablet 0   rosuvastatin (CRESTOR) 20 MG tablet Take 20 mg by mouth daily.  5   No current facility-administered medications for this encounter.    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   Social Determinants of Health   Financial Resource Strain: Not on file  Food Insecurity: Not on file  Transportation Needs: Not on file  Physical Activity: Not on file  Stress: Not on file  Social Connections: Not on file  Intimate Partner Violence: Not on file    Family History  Problem Relation Age of Onset   Stroke Mother    Alzheimer's disease Mother    Alzheimer's disease Father     ROS- All systems are reviewed and negative except as per the HPI above  Physical Exam: There were no vitals filed for this visit.  Wt Readings from Last 3 Encounters:  07/25/20 77.8 kg  07/24/20 77.5 kg  04/09/20 77.6 kg    Labs: Lab Results  Component Value Date   NA 138 07/25/2020   K 4.1  07/25/2020   CL 108 07/25/2020   CO2 26 07/25/2020   GLUCOSE 179 (H) 07/25/2020   BUN 16 07/25/2020   CREATININE 1.44 (H) 07/25/2020   CALCIUM 8.7 (L) 07/25/2020   MG 2.3 07/25/2020   No results found for: INR No results found for: CHOL, HDL, LDLCALC, TRIG   GEN- The patient is well appearing, alert and oriented x 3 today.   Head- normocephalic, atraumatic Eyes-  Sclera clear, conjunctiva pink Ears- hearing intact Oropharynx- clear Neck- supple, no JVP Lymph- no cervical lymphadenopathy Lungs- Clear to ausculation bilaterally, normal work of breathing. No tenderness of chest wall on palpation Heart-irregular  regular rate and rhythm, no murmurs, rubs or gallops, PMI not laterally displaced GI- soft, NT, ND, + BS Extremities- no clubbing, cyanosis, or  Mild edema around bilateral ankle area MS- no significant deformity or atrophy Skin- no rash or lesion Psych- euthymic mood, full affect Neuro- strength and sensation are intact  EKG-  atrial flutter with variable block at 135 bpm, qrs int 86 ms, qtc at 537 ms    Assessment and Plan: 1. Afib/flutter  RVR present today  Minimally symptomatic  I will start back on BB for the RVR but then he has to be taken off when he returns to SR for bradycardia  I am concerned re tachy/brady syndrome  Continue dofetilide 250 mcg a day Start back on  low  dose metoprolol 25 mg 1/2 tab bid for rate control Continue apixaban 5 mg bid for chadsvasc score of 3   Cbc/bmet/mag today   2.  HTN Stable for pt   F/u tomorrow for recheck with EKG    Lupita Leash [facility] [address] [place], [place] [address] [phone]

## 2020-11-06 ENCOUNTER — Encounter (HOSPITAL_COMMUNITY): Payer: Self-pay | Admitting: Nurse Practitioner

## 2020-11-06 ENCOUNTER — Ambulatory Visit (HOSPITAL_COMMUNITY)
Admission: RE | Admit: 2020-11-06 | Discharge: 2020-11-06 | Disposition: A | Discharge status: Home / Self Care | Payer: Medicare Other | Source: Ambulatory Visit | Attending: Nurse Practitioner | Admitting: Nurse Practitioner

## 2020-11-06 DIAGNOSIS — Z5181 Encounter for therapeutic drug level monitoring: Secondary | ICD-10-CM | POA: Insufficient documentation

## 2020-11-06 DIAGNOSIS — I4891 Unspecified atrial fibrillation: Secondary | ICD-10-CM | POA: Insufficient documentation

## 2020-11-06 DIAGNOSIS — Z79899 Other long term (current) drug therapy: Secondary | ICD-10-CM | POA: Diagnosis not present

## 2020-11-06 DIAGNOSIS — I4892 Unspecified atrial flutter: Secondary | ICD-10-CM

## 2020-11-06 NOTE — Progress Notes (Signed)
Pt in for ekg after finding pt in afib with RVR yesterday. Placed him back on metoprolol 12.5 mg bid and he is back in SR today at 55 bpm, PR int 180 ms, qrs int 98 ms, qtc 464 ms. He feels improved.  I will continue metoprolol 12.5 mg bid and I will see back in one week for EKG.

## 2020-11-11 ENCOUNTER — Encounter (HOSPITAL_COMMUNITY): Payer: Medicare Other | Admitting: Nurse Practitioner

## 2020-11-11 ENCOUNTER — Ambulatory Visit (HOSPITAL_COMMUNITY)
Admission: RE | Admit: 2020-11-11 | Discharge: 2020-11-11 | Disposition: A | Discharge status: Home / Self Care | Payer: Medicare Other | Source: Ambulatory Visit | Attending: Nurse Practitioner | Admitting: Nurse Practitioner

## 2020-11-11 ENCOUNTER — Other Ambulatory Visit: Payer: Self-pay

## 2020-11-11 VITALS — BP 116/56 | HR 129 | Ht 73.0 in

## 2020-11-11 DIAGNOSIS — I4892 Unspecified atrial flutter: Secondary | ICD-10-CM | POA: Diagnosis not present

## 2020-11-11 DIAGNOSIS — Z7901 Long term (current) use of anticoagulants: Secondary | ICD-10-CM | POA: Insufficient documentation

## 2020-11-11 DIAGNOSIS — D6869 Other thrombophilia: Secondary | ICD-10-CM | POA: Insufficient documentation

## 2020-11-11 MED ORDER — METOPROLOL TARTRATE 25 MG PO TABS
12.5000 mg | ORAL_TABLET | Freq: Two times a day (BID) | ORAL | 6 refills | Status: DC
Start: 2020-11-11 — End: 2020-11-20

## 2020-11-11 NOTE — Progress Notes (Signed)
Pt in for EKG after adding metoprolol back to his tikosyn as he was found to be out of rhythm last week. On return one day later he was in sinus brady in the 50's. He has had issues with brady in the past while on BB's so he is brought back  today to assess his EKG/BP. He was also having some mild ankle edema that I wanted to see if being back in rhythm will resolve.   EKG today shows he is back in atrial flutter at 129 bpm, qrs int 92 ms, qtc 442 ms. He states that he has been taking all of his meds. He feels ok. Ankle edema is improved. He wants to drive to [employer] tomorrow over 2 days taime but driving on the interstates. Ids do not feel comfortable saying it is save to go if hie HR is elevated as it is today. I am concerned pushing BB dose may cause profound bradycardia.    He will go home and take his pm metoprolol early. I will see back in the office tomorrow am for further decisions re  rhythm. I am concerned re tachy brady syndrome and will talk to Dr. Johney Frame about him in the am.

## 2020-11-12 ENCOUNTER — Encounter (HOSPITAL_COMMUNITY): Payer: Self-pay | Admitting: Nurse Practitioner

## 2020-11-12 ENCOUNTER — Ambulatory Visit (HOSPITAL_COMMUNITY)
Admission: RE | Admit: 2020-11-12 | Discharge: 2020-11-12 | Disposition: A | Discharge status: Home / Self Care | Payer: Medicare Other | Source: Ambulatory Visit | Attending: Nurse Practitioner | Admitting: Nurse Practitioner

## 2020-11-12 VITALS — BP 128/64 | HR 117

## 2020-11-12 DIAGNOSIS — I4819 Other persistent atrial fibrillation: Secondary | ICD-10-CM | POA: Diagnosis not present

## 2020-11-12 DIAGNOSIS — I4892 Unspecified atrial flutter: Secondary | ICD-10-CM | POA: Insufficient documentation

## 2020-11-12 DIAGNOSIS — J439 Emphysema, unspecified: Secondary | ICD-10-CM | POA: Diagnosis not present

## 2020-11-12 DIAGNOSIS — D6869 Other thrombophilia: Secondary | ICD-10-CM | POA: Diagnosis not present

## 2020-11-12 DIAGNOSIS — Z5181 Encounter for therapeutic drug level monitoring: Secondary | ICD-10-CM

## 2020-11-12 DIAGNOSIS — I10 Essential (primary) hypertension: Secondary | ICD-10-CM | POA: Diagnosis not present

## 2020-11-12 DIAGNOSIS — Z79899 Other long term (current) drug therapy: Secondary | ICD-10-CM

## 2020-11-12 DIAGNOSIS — Z7901 Long term (current) use of anticoagulants: Secondary | ICD-10-CM | POA: Insufficient documentation

## 2020-11-12 LAB — TSH: TSH: 1.29 u[IU]/mL (ref 0.350–4.500)

## 2020-11-12 LAB — HEPATIC FUNCTION PANEL
ALT: 47 U/L — ABNORMAL HIGH (ref 0–44)
AST: 26 U/L (ref 15–41)
Albumin: 3.3 g/dL — ABNORMAL LOW (ref 3.5–5.0)
Alkaline Phosphatase: 52 U/L (ref 38–126)
Bilirubin, Direct: 0.2 mg/dL (ref 0.0–0.2)
Indirect Bilirubin: 0.6 mg/dL (ref 0.3–0.9)
Total Bilirubin: 0.8 mg/dL (ref 0.3–1.2)
Total Protein: 5.5 g/dL — ABNORMAL LOW (ref 6.5–8.1)

## 2020-11-12 MED ORDER — AMIODARONE HCL 200 MG PO TABS: ORAL_TABLET | ORAL | 0 refills | Status: DC

## 2020-11-12 NOTE — Progress Notes (Signed)
Primary Care Physician: Geoffry Paradise, MD Referring Physician:Dr. Johney Frame   Sean Bowman is a 87 y.o. male with a h/o persistent afib that was diagnosed in December of 2017. He was started on anticoagulation and weeks later had DCCV which was unsuccessful. At time of cardioversion Cardizem was doubled for better rate control and simvastatin was stopped due to drug drug interaction concerns. He was later started on rosuvastatin.  Echo showed EF of 40-45%. It was decided that he would benefit from restoring SR with tikosyn with probable TMC. He was admitted for Tikosyn initiation.He returned to SR and qtc was stable. He was eventually taken off Cardizem and some ankle edema he had improved and he only takes lasix as needed. Left on low dose BB. BB was held at one point, and had return of afib.  F/u in afib clinic, 03/16/17, for general  f/u of tikosyn. He is staying in SR. He has had all over joint discomfort including the chest, shoulders and back. He saw a specialist and is thought to have some sort of arthritis and is on prednisone which does help with symptoms. No bleeding issues with eliquis.  F/u in afib clinic 4/22,he is doing well staying in SR with Tikosyn.  He has a HR in the 40's, which is chronic but is not symptomatic. 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 No bleeding issues with eliquis 5 mg bid.  F/u in afib clinic 8/28. He feels well. He again has a HR in the upper 40's but is asymptomatic. He is taking eliquis on a regular basis. Being compliant with dofetilide as well.  F/u in afib clinic, 12/14. He appears to be at his baseline. No afib to report just concerned re the slow HR, from  which he does not appear to be symptomatic. We have discussed in the past trying to stop the BB and today, he would like to pursue this. Reports some light nosebleeds.   F/u in afib clinic, 6/9. He feels well. He is staying in SR. No complaints other than very mild dizziness with  position change. He is not currently  on any medication that could contribute to this.   F/u 12/12/18. He reports that he feels well. No issues with afib. Continues on Tikosyn. No issues with anticoagulation.  F/i in afib clinic 03/15/19. He has not noted any afib. Continues on tikosyn and apixaban 5 mg bid for a CHA2DS2VASc of 3. No issues with bleeding. HR's in the 40's today that has been present in the past. He is not symptomatic nor on rate control.   F/u in afib clinic, 06/13/19.He remains in sinus brady which is  chronic and he tolerates well Has some mild LLE at end of the day that goes away by the next morning. No complaints today. Being compliant with tikosyn and eliquis. No afib to report.   F/u afib clinic, 09/12/19. No afib to report. Has a resting HR in the 40's but when walked down the hall, the HR picked up appropriately  to the mid 60's. He is not symptomatic with the bradycardia. The bradycardia is symptomatic and he is now on any AV nodal blocking drugs. He feels well. He will soon turn 89. No issues with DOAC, takes Tikosyn on a regular basis. He has both covid shots.    F/u in afib clinic, 12/17/19. Remains in sinus brady at 51 bpm and reports no afib awareness. He has c/o of dizziness from time to time  but is stating that is more noticeable lately. This is with sitting and standing. He is not on any AV nodal blocking drugs. His BP is elevated today, he does not monitor his HR or BP at home. I did walk him last time he was in the clinic and his HR did pick up appropriately with exercise.  Continues on eliquis, no bleeding issues, CHA2DS2VASc score of 3.   F/u in afib clinic 04/09/20. He saw Dr. Johney Frame in November and pt had a low risk stress test. Dr. Johney Frame also offered him a PPM, with his low HR's and  intermittent dizziness. Pt declined. He remains in SR with Tikosyn. EKg shows sinus brady at 50 bpm today. He has not noted much dizziness lately. He continues to  work. Qtc stable.   F/u  with afib clinic, 07/24/20. Ekg shows afib with rvr at 134 bpm.  He was unaware. He states that he came down suddenly with fatigue and head congestion 2 days ago. He denies fever, cough, shortness of breath with exertion. PO 97%.  He has been using OC decongestants.  CHA2DS2VASc score of 3. He continues on eliquis. He has not spoken to PCP re recent symptoms.   F/u  in afib clinic, 11/05/20. He did convert to SR with  BB added after last visit in May when he was asymptomatic with afib with RVR. He was sick at the time. After he converted , BB was again made prn, as he is slow in SR. Today EKG shows atrial flutter with RVR at 135 bpm. He is not really aware of being out of rhythm but feels he may have been off for the last several weeks. 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 Denies any recent sickness.he continues on tikosyn and eliquis.   F/u in afib clinic, 11/12/20, after being found back in afib earlier in week and coming back for EKG's for rhythm check. He initially converted when metoprolol 12.5 mg bid was added. He continues in afib with RVR. I am concerned to push BB as he has a HR in the 40's in the past with low dose BB. He has been asymptomatic with bradycardia in the past and the only issue he has noted recently with the RVR is that he feels anxious in his chest at times. At his advanced age, I am concerned that he could deteriorate with RVR. He was suppose to drive to a wedding in [place] today but I have advised against this as I am afraid he may become lightheaded behind the wheel. He agrees not to go. He still works running his own business. I discussed with Dr. Johney Frame and he feels that tikosyn should be stopped and amiodarone started. Pt is in agreement. Dr. Johney Frame  would prefer to defer PPM at this point for tachy/brady.   Today, he denies symptoms  of palpitations,  shortness of breath, orthopnea, PND  dizziness, presyncope, syncope, or neurologic sequela. The patient is tolerating medications without difficulties and is otherwise without complaint today.   Past Medical History:  Diagnosis Date   Arthralgia    Benign positional vertigo    HTN (hypertension)    Hyperlipidemia    Impaired glucose tolerance    Persistent atrial fibrillation (HCC)    Senile purpura (HCC)    Past Surgical History:  Procedure Laterality Date   CARDIOVERSION  N/A 03/29/2016   Procedure: CARDIOVERSION;  Surgeon: Thurmon Fair, MD;  Location: [facility];  Service: Cardiovascular;  Laterality: N/A;   CATARACT EXTRACTION W/ INTRAOCULAR LENS  IMPLANT, BILATERAL Bilateral 2017   ORIF METACARPAL FRACTURE Left ~ 2016   S/P fall   TONSILLECTOMY      Current Outpatient Medications  Medication Sig Dispense Refill   amiodarone (PACERONE) 200 MG tablet Sunday, 9/7 Take 1 tablet twice a day for 14 days then reduce to 1 tablet a day 60 tablet 0   apixaban (ELIQUIS) 5 MG TABS tablet Take 1 tablet (5 mg total) by mouth 2 (two) times daily. 180 tablet 3   latanoprost (XALATAN) 0.005 % ophthalmic solution Place 1 drop into both eyes at bedtime.     metoprolol tartrate (LOPRESSOR) 25 MG tablet Take 0.5 tablets (12.5 mg total) by mouth 2 (two) times daily. 15 tablet 6   rosuvastatin (CRESTOR) 20 MG tablet Take 20 mg by mouth daily.  5   No current facility-administered medications for this encounter.    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   Social Determinants of Health   Financial Resource Strain: Not on file  Food Insecurity: Not on file  Transportation Needs: Not on file  Physical Activity: Not on file  Stress: Not on file  Social Connections: Not on file  Intimate Partner Violence: Not on file    Family History  Problem Relation Age of Onset   Stroke Mother    Alzheimer's disease Mother    Alzheimer's disease Father     ROS- All systems are reviewed and negative except as per the HPI above  Physical Exam: Vitals:   11/12/20 0832  BP: 128/64  Pulse: (!) 117    Wt Readings from Last 3 Encounters:  11/05/20 78.7 kg  07/25/20 77.8 kg  07/24/20 77.5 kg    Labs: Lab Results  Component Value Date   NA 139 11/05/2020   K 4.6 11/05/2020   CL 109 11/05/2020   CO2 24 11/05/2020   GLUCOSE 154 (H) 11/05/2020   BUN 19 11/05/2020   CREATININE 1.14 11/05/2020   CALCIUM 9.1 11/05/2020   MG 2.2 11/05/2020   No results found for: INR No results found for: CHOL, HDL, LDLCALC, TRIG   GEN- The patient is well appearing, alert and oriented x 3 today.   Head- normocephalic, atraumatic Eyes-  Sclera clear, conjunctiva pink Ears- hearing intact Oropharynx- clear Neck- supple, no JVP Lymph- no cervical lymphadenopathy Lungs- Clear to ausculation bilaterally, normal work of breathing. No tenderness of chest wall on palpation Heart-irregular  regular rate and rhythm, no murmurs, rubs or gallops, PMI not laterally displaced GI- soft, NT,  ND, + BS Extremities- no clubbing, cyanosis, or  Mild edema around bilateral ankle area MS- no significant deformity or atrophy Skin- no rash or lesion Psych- euthymic mood, full affect Neuro- strength and sensation are intact  EKG-  afib  at 117 bpm, qrs int 92 ms, qtc 510 ms    Assessment and Plan: 1. Afib/flutter  RVR present today  Minimally symptomatic   Discussed with Dr. Johney Frame as he has recently presented with afib with RVR and with known bradycardic in SR I am concerned re tachy/brady syndrome  Dr. Johney Frame would like to  defer PPM We discussed washing out tikosyn x 3 days, no more as of today. On Sunday, he will stop metoprolol and start amiodarone 200 mg bid Benefit of amiodarone vrs sie effects discussed with pt  Baseline TSH,  Liver panel and CXR today He denies any thyroid, liver or lung issues Continue apixaban 5 mg bid for chadsvasc score of 3    2.  HTN Stable   F/u next Thursday    Cecillia Menees C[facility] [address] [place], [place] [address] [phone]

## 2020-11-12 NOTE — Patient Instructions (Addendum)
STOP TIKOSYN as of today   On Sunday, September 11th -- STOP metoprolol  On Sunday, September 11th -- START Amiodarone 200mg  twice a day with food

## 2020-11-13 ENCOUNTER — Encounter (HOSPITAL_COMMUNITY): Payer: Medicare Other | Admitting: Nurse Practitioner

## 2020-11-15 ENCOUNTER — Other Ambulatory Visit (HOSPITAL_COMMUNITY): Payer: Self-pay | Admitting: Nurse Practitioner

## 2020-11-20 ENCOUNTER — Other Ambulatory Visit: Payer: Self-pay

## 2020-11-20 ENCOUNTER — Ambulatory Visit (HOSPITAL_COMMUNITY)
Admission: RE | Admit: 2020-11-20 | Discharge: 2020-11-20 | Disposition: A | Discharge status: Home / Self Care | Payer: Medicare Other | Source: Ambulatory Visit | Attending: Nurse Practitioner | Admitting: Nurse Practitioner

## 2020-11-20 ENCOUNTER — Encounter (HOSPITAL_COMMUNITY): Payer: Self-pay | Admitting: Nurse Practitioner

## 2020-11-20 VITALS — BP 120/90 | HR 122 | Ht 73.0 in | Wt 173.5 lb

## 2020-11-20 DIAGNOSIS — I4891 Unspecified atrial fibrillation: Secondary | ICD-10-CM | POA: Insufficient documentation

## 2020-11-20 DIAGNOSIS — Z79899 Other long term (current) drug therapy: Secondary | ICD-10-CM | POA: Diagnosis not present

## 2020-11-20 DIAGNOSIS — D6869 Other thrombophilia: Secondary | ICD-10-CM

## 2020-11-20 NOTE — Progress Notes (Signed)
In for EKG after washing out of Tiksoyn x 3 days as well as metoprolol for start of amiodarone 200 mg bid, as pt has failed Tikosyn to keep him in rhythm. He has now been on amiodarone since Sunday.He remains in afib with RVR at 122 bpm,  although he is not symptomatic with this. He states he was able to do his yard work and sleep without issues. I am afraid to add more rate control as he can have significant bradycardia in SR. So I will see him back in one week after amio has more chance to build up in system and if drug does not convert pt, I will plan on cardioversion in about one month.

## 2020-11-27 ENCOUNTER — Other Ambulatory Visit: Payer: Self-pay

## 2020-11-27 ENCOUNTER — Ambulatory Visit (HOSPITAL_COMMUNITY)
Admission: RE | Admit: 2020-11-27 | Discharge: 2020-11-27 | Disposition: A | Discharge status: Home / Self Care | Payer: Medicare Other | Source: Ambulatory Visit | Attending: Nurse Practitioner | Admitting: Nurse Practitioner

## 2020-11-27 ENCOUNTER — Encounter (HOSPITAL_COMMUNITY): Payer: Self-pay | Admitting: Nurse Practitioner

## 2020-11-27 VITALS — BP 150/80 | HR 109 | Ht 73.0 in | Wt 170.4 lb

## 2020-11-27 DIAGNOSIS — I4819 Other persistent atrial fibrillation: Secondary | ICD-10-CM

## 2020-11-27 DIAGNOSIS — Z7901 Long term (current) use of anticoagulants: Secondary | ICD-10-CM | POA: Diagnosis not present

## 2020-11-27 DIAGNOSIS — D6869 Other thrombophilia: Secondary | ICD-10-CM

## 2020-11-27 DIAGNOSIS — I4891 Unspecified atrial fibrillation: Secondary | ICD-10-CM | POA: Insufficient documentation

## 2020-11-27 DIAGNOSIS — I4892 Unspecified atrial flutter: Secondary | ICD-10-CM | POA: Diagnosis not present

## 2020-11-27 DIAGNOSIS — Z79899 Other long term (current) drug therapy: Secondary | ICD-10-CM | POA: Insufficient documentation

## 2020-11-27 DIAGNOSIS — I10 Essential (primary) hypertension: Secondary | ICD-10-CM | POA: Diagnosis not present

## 2020-11-27 NOTE — Progress Notes (Signed)
Primary Care Physician: Geoffry Paradise, MD Referring Physician:Dr. Johney Frame   Sean Bowman is a 87 y.o. male with a h/o persistent afib that was diagnosed in December of 2017. He was started on anticoagulation and weeks later had DCCV which was unsuccessful. At time of cardioversion Cardizem was doubled for better rate control and simvastatin was stopped due to drug drug interaction concerns. He was later started on rosuvastatin.  Echo showed EF of 40-45%. It was decided that he would benefit from restoring SR with tikosyn with probable TMC. He was admitted for Tikosyn initiation.He returned to SR and qtc was stable. He was eventually taken off Cardizem and some ankle edema he had improved and he only takes lasix as needed. Left on low dose BB. BB was held at one point, and had return of afib.  F/u in afib clinic, 03/16/17, for general  f/u of tikosyn. He is staying in SR. He has had all over joint discomfort including the chest, shoulders and back. He saw a specialist and is thought to have some sort of arthritis and is on prednisone which does help with symptoms. No bleeding issues with eliquis.  F/u in afib clinic 4/22,he is doing well staying in SR with Tikosyn.  He has a HR in the 40's, which is chronic but is not symptomatic. 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 No bleeding issues with eliquis 5 mg bid.  F/u in afib clinic 8/28. He feels well. He again has a HR in the upper 40's but is asymptomatic. He is taking eliquis on a regular basis. Being compliant with dofetilide as well.  F/u in afib clinic, 12/14. He appears to be at his baseline. No afib to report just concerned re the slow HR, from  which he does not appear to be symptomatic. We have discussed in the past trying to stop the BB and today, he would like to pursue this. Reports some light nosebleeds.   F/u in afib clinic, 6/9. He feels well. He is staying in SR. No complaints other than very mild dizziness with  position change. He is not currently  on any medication that could contribute to this.   F/u 12/12/18. He reports that he feels well. No issues with afib. Continues on Tikosyn. No issues with anticoagulation.  F/i in afib clinic 03/15/19. He has not noted any afib. Continues on tikosyn and apixaban 5 mg bid for a CHA2DS2VASc of 3. No issues with bleeding. HR's in the 40's today that has been present in the past. He is not symptomatic nor on rate control.   F/u in afib clinic, 06/13/19.He remains in sinus brady which is  chronic and he tolerates well Has some mild LLE at end of the day that goes away by the next morning. No complaints today. Being compliant with tikosyn and eliquis. No afib to report.   F/u afib clinic, 09/12/19. No afib to report. Has a resting HR in the 40's but when walked down the hall, the HR picked up appropriately  to the mid 60's. He is not symptomatic with the bradycardia. The bradycardia is symptomatic and he is now on any AV nodal blocking drugs. He feels well. He will soon turn 89. No issues with DOAC, takes Tikosyn on a regular basis. He has both covid shots.    F/u in afib clinic, 12/17/19. Remains in sinus brady at 51 bpm and reports no afib awareness. He has c/o of dizziness from time to time  but is stating that is more noticeable lately. This is with sitting and standing. He is not on any AV nodal blocking drugs. His BP is elevated today, he does not monitor his HR or BP at home. I did walk him last time he was in the clinic and his HR did pick up appropriately with exercise.  Continues on eliquis, no bleeding issues, CHA2DS2VASc score of 3.   F/u in afib clinic 04/09/20. He saw Dr. Johney Frame in November and pt had a low risk stress test. Dr. Johney Frame also offered him a PPM, with his low HR's and  intermittent dizziness. Pt declined. He remains in SR with Tikosyn. EKg shows sinus brady at 50 bpm today. He has not noted much dizziness lately. He continues to  work. Qtc stable.   F/u  with afib clinic, 07/24/20. Ekg shows afib with rvr at 134 bpm.  He was unaware. He states that he came down suddenly with fatigue and head congestion 2 days ago. He denies fever, cough, shortness of breath with exertion. PO 97%.  He has been using OC decongestants.  CHA2DS2VASc score of 3. He continues on eliquis. He has not spoken to PCP re recent symptoms.   F/u  in afib clinic, 11/05/20. He did convert to SR with  BB added after last visit in May when he was asymptomatic with afib with RVR. He was sick at the time. After he converted , BB was again made prn, as he is slow in SR. Today EKG shows atrial flutter with RVR at 135 bpm. He is not really aware of being out of rhythm but feels he may have been off for the last several weeks. 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 Denies any recent sickness.he continues on tikosyn and eliquis.   F/u in afib clinic, 11/12/20, after being found back in afib earlier in week and coming back for EKG's for rhythm check. He initially converted when metoprolol 12.5 mg bid was added. He continues in afib with RVR. I am concerned to push BB as he has a HR in the 40's in the past with low dose BB. He has been asymptomatic with bradycardia in the past and the only issue he has noted recently with the RVR is that he feels anxious in his chest at times. At his advanced age, I am concerned that he could deteriorate with RVR. He was suppose to drive to a wedding in [place] today but I have advised against this as I am afraid he may become lightheaded behind the wheel. He agrees not to go. He still works running his own business. I discussed with Dr. Johney Frame and he feels that tikosyn should be stopped and amiodarone started. Pt is in agreement. Dr. Johney Frame  would prefer to defer PPM at this point for tachy/brady.   F/u in afib clinic,  11/28/20. He is back in afib clinic  being loaded on amiodarone for failing Tikosyn and afib with RVR. He is now on amiodarone for around 10 days. He appears to be tolerating afib well. Ekg shows afib at 109 bpm. We discussed I will plan to cardiovert in another 2-3 weeks. No missed anticoagulation.  Today, he denies symptoms of palpitations,  shortness of breath, orthopnea, PND  dizziness, presyncope, syncope, or neurologic sequela. The patient is tolerating medications without difficulties and is otherwise without  complaint today.   Past Medical History:  Diagnosis Date   Arthralgia    Benign positional vertigo    HTN (hypertension)    Hyperlipidemia    Impaired glucose tolerance    Persistent atrial fibrillation (HCC)    Senile purpura (HCC)    Past Surgical History:  Procedure Laterality Date   CARDIOVERSION N/A 03/29/2016   Procedure: CARDIOVERSION;  Surgeon: Thurmon Fair, MD;  Location: [facility];  Service: Cardiovascular;  Laterality: N/A;   CATARACT EXTRACTION W/ INTRAOCULAR LENS  IMPLANT, BILATERAL Bilateral 2017   ORIF METACARPAL FRACTURE Left ~ 2016   S/P fall   TONSILLECTOMY      Current Outpatient Medications  Medication Sig Dispense Refill   amiodarone (PACERONE) 200 MG tablet Sunday, 9/11 Take 1 tablet twice a day for 14 days then reduce to 1 tablet a day 60 tablet 0   apixaban (ELIQUIS) 5 MG TABS tablet Take 1 tablet (5 mg total) by mouth 2 (two) times daily. 180 tablet 3   latanoprost (XALATAN) 0.005 % ophthalmic solution Place 1 drop into both eyes at bedtime.     rosuvastatin (CRESTOR) 20 MG tablet Take 20 mg by mouth daily.  5   No current facility-administered medications for this encounter.    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   Social Determinants of Health   Financial Resource Strain: Not on file  Food Insecurity: Not on file  Transportation Needs: Not on file  Physical Activity: Not on file  Stress: Not on file  Social Connections: Not on file  Intimate Partner Violence: Not on file    Family History  Problem Relation Age of Onset   Stroke Mother    Alzheimer's disease Mother    Alzheimer's disease Father     ROS- All systems are reviewed and negative except as per the HPI above  Physical Exam: Vitals:   11/27/20 1146  Weight: 77.3 kg  Height: 6\' 1"  (1.854 m)    Wt Readings from Last 3 Encounters:  11/27/20 77.3 kg  11/20/20 78.7 kg  11/05/20 78.7 kg    Labs: Lab Results  Component Value Date   NA 139 11/05/2020   K 4.6 11/05/2020   CL 109 11/05/2020   CO2 24 11/05/2020   GLUCOSE 154 (H) 11/05/2020   BUN 19 11/05/2020   CREATININE 1.14 11/05/2020   CALCIUM 9.1 11/05/2020   MG 2.2 11/05/2020   No results found for: INR No results found for: CHOL, HDL, LDLCALC, TRIG   GEN- The patient is well appearing, alert and oriented x 3 today.   Head- normocephalic, atraumatic Eyes-  Sclera clear, conjunctiva pink Ears- hearing intact Oropharynx- clear Neck- supple,  no JVP Lymph- no cervical lymphadenopathy Lungs- Clear to ausculation bilaterally, normal work of breathing. No tenderness of chest wall on palpation Heart-regular  regular rate and rhythm, no murmurs, rubs or gallops, PMI not laterally displaced GI- soft, NT, ND, + BS Extremities- no  clubbing, cyanosis, or  Mild edema around bilateral ankle area MS- no significant deformity or atrophy Skin- no rash or lesion Psych- euthymic mood, full affect Neuro- strength and sensation are intact  EKG-  afib at 109 bpm, qrs int 100  ms, qtc 425 ms    Assessment and Plan: 1. Afib/flutter  RVR present today, but slower since starting amiodarone   Minimally symptomatic  Continue amiodarone  200 mg bid  Continue apixaban 5 mg bid for chadsvasc score of 3    2.  HTN Stable   F/u in 10 days when we will schedule cardioversion    Lupita Leash [facility] [address] [place], [place] [address] [phone]

## 2020-12-05 ENCOUNTER — Other Ambulatory Visit (HOSPITAL_COMMUNITY): Payer: Self-pay | Admitting: Nurse Practitioner

## 2020-12-09 ENCOUNTER — Ambulatory Visit (HOSPITAL_COMMUNITY): Payer: Medicare Other | Admitting: Nurse Practitioner

## 2020-12-10 ENCOUNTER — Encounter (HOSPITAL_COMMUNITY): Payer: Self-pay | Admitting: Nurse Practitioner

## 2020-12-10 ENCOUNTER — Ambulatory Visit (HOSPITAL_COMMUNITY)
Admission: RE | Admit: 2020-12-10 | Discharge: 2020-12-10 | Disposition: A | Discharge status: Home / Self Care | Payer: Medicare Other | Source: Ambulatory Visit | Attending: Nurse Practitioner | Admitting: Nurse Practitioner

## 2020-12-10 ENCOUNTER — Other Ambulatory Visit: Payer: Self-pay

## 2020-12-10 VITALS — BP 150/86 | HR 112 | Ht 73.0 in | Wt 169.2 lb

## 2020-12-10 DIAGNOSIS — Z7901 Long term (current) use of anticoagulants: Secondary | ICD-10-CM | POA: Insufficient documentation

## 2020-12-10 DIAGNOSIS — I4819 Other persistent atrial fibrillation: Secondary | ICD-10-CM

## 2020-12-10 DIAGNOSIS — Z79899 Other long term (current) drug therapy: Secondary | ICD-10-CM | POA: Diagnosis not present

## 2020-12-10 DIAGNOSIS — D6869 Other thrombophilia: Secondary | ICD-10-CM | POA: Diagnosis not present

## 2020-12-10 DIAGNOSIS — Z8249 Family history of ischemic heart disease and other diseases of the circulatory system: Secondary | ICD-10-CM | POA: Diagnosis not present

## 2020-12-10 DIAGNOSIS — I4892 Unspecified atrial flutter: Secondary | ICD-10-CM | POA: Diagnosis not present

## 2020-12-10 DIAGNOSIS — I10 Essential (primary) hypertension: Secondary | ICD-10-CM | POA: Insufficient documentation

## 2020-12-10 LAB — CBC
HCT: 39.8 % (ref 39.0–52.0)
Hemoglobin: 13 g/dL (ref 13.0–17.0)
MCH: 32.7 pg (ref 26.0–34.0)
MCHC: 32.7 g/dL (ref 30.0–36.0)
MCV: 100.3 fL — ABNORMAL HIGH (ref 80.0–100.0)
Platelets: 179 10*3/uL (ref 150–400)
RBC: 3.97 MIL/uL — ABNORMAL LOW (ref 4.22–5.81)
RDW: 14.6 % (ref 11.5–15.5)
WBC: 7.1 10*3/uL (ref 4.0–10.5)
nRBC: 0 % (ref 0.0–0.2)

## 2020-12-10 LAB — BASIC METABOLIC PANEL
Anion gap: 8 (ref 5–15)
BUN: 16 mg/dL (ref 8–23)
CO2: 27 mmol/L (ref 22–32)
Calcium: 9.1 mg/dL (ref 8.9–10.3)
Chloride: 105 mmol/L (ref 98–111)
Creatinine, Ser: 1.18 mg/dL (ref 0.61–1.24)
GFR, Estimated: 58 mL/min — ABNORMAL LOW (ref >60)
Glucose, Bld: 104 mg/dL — ABNORMAL HIGH (ref 70–99)
Potassium: 4.4 mmol/L (ref 3.5–5.1)
Sodium: 140 mmol/L (ref 135–145)

## 2020-12-10 NOTE — Progress Notes (Signed)
Primary Care Physician: Geoffry Paradise, MD Referring Physician:Dr. Johney Frame   Sean Bowman is a 87 y.o. male with a h/o persistent afib that was diagnosed in December of 2017. He was started on anticoagulation and weeks later had DCCV which was unsuccessful. At time of cardioversion Cardizem was doubled for better rate control and simvastatin was stopped due to drug drug interaction concerns. He was later started on rosuvastatin.  Echo showed EF of 40-45%. It was decided that he would benefit from restoring SR with tikosyn with probable TMC. He was admitted for Tikosyn initiation.He returned to SR and qtc was stable. He was eventually taken off Cardizem and some ankle edema he had improved and he only takes lasix as needed. Left on low dose BB. BB was held at one point, and had return of afib.  F/u in afib clinic, 03/16/17, for general  f/u of tikosyn. He is staying in SR. He has had all over joint discomfort including the chest, shoulders and back. He saw a specialist and is thought to have some sort of arthritis and is on prednisone which does help with symptoms. No bleeding issues with eliquis.  F/u in afib clinic 4/22,he is doing well staying in SR with Tikosyn.  He has a HR in the 40's, which is chronic but is not symptomatic. 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 No bleeding issues with eliquis 5 mg bid.  F/u in afib clinic 8/28. He feels well. He again has a HR in the upper 40's but is asymptomatic. He is taking eliquis on a regular basis. Being compliant with dofetilide as well.  F/u in afib clinic, 12/14. He appears to be at his baseline. No afib to report just concerned re the slow HR, from  which he does not appear to be symptomatic. We have discussed in the past trying to stop the BB and today, he would like to pursue this. Reports some light nosebleeds.   F/u in afib clinic, 6/9. He feels well. He is staying in SR. No complaints other than very mild dizziness with  position change. He is not currently  on any medication that could contribute to this.   F/u 12/12/18. He reports that he feels well. No issues with afib. Continues on Tikosyn. No issues with anticoagulation.  F/i in afib clinic 03/15/19. He has not noted any afib. Continues on tikosyn and apixaban 5 mg bid for a CHA2DS2VASc of 3. No issues with bleeding. HR's in the 40's today that has been present in the past. He is not symptomatic nor on rate control.   F/u in afib clinic, 06/13/19.He remains in sinus brady which is  chronic and he tolerates well Has some mild LLE at end of the day that goes away by the next morning. No complaints today. Being compliant with tikosyn and eliquis. No afib to report.   F/u afib clinic, 09/12/19. No afib to report. Has a resting HR in the 40's but when walked down the hall, the HR picked up appropriately  to the mid 60's. He is not symptomatic with the bradycardia. The bradycardia is symptomatic and he is now on any AV nodal blocking drugs. He feels well. He will soon turn 89. No issues with DOAC, takes Tikosyn on a regular basis. He has both covid shots.    F/u in afib clinic, 12/17/19. Remains in sinus brady at 51 bpm and reports no afib awareness. He has c/o of dizziness from time to time  but is stating that is more noticeable lately. This is with sitting and standing. He is not on any AV nodal blocking drugs. His BP is elevated today, he does not monitor his HR or BP at home. I did walk him last time he was in the clinic and his HR did pick up appropriately with exercise.  Continues on eliquis, no bleeding issues, CHA2DS2VASc score of 3.   F/u in afib clinic 04/09/20. He saw Dr. Rayann Heman in November and pt had a low risk stress test. Dr. Rayann Heman also offered him a PPM, with his low HR's and  intermittent dizziness. Pt declined. He remains in SR with Tikosyn. EKg shows sinus brady at 50 bpm today. He has not noted much dizziness lately. He continues to  work. Qtc stable.   F/u  with afib clinic, 07/24/20. Ekg shows afib with rvr at 134 bpm.  He was unaware. He states that he came down suddenly with fatigue and head congestion 2 days ago. He denies fever, cough, shortness of breath with exertion. PO 97%.  He has been using OC decongestants.  CHA2DS2VASc score of 3. He continues on eliquis. He has not spoken to PCP re recent symptoms.   F/u  in afib clinic, 11/05/20. He did convert to SR with  BB added after last visit in May when he was asymptomatic with afib with RVR. He was sick at the time. After he converted , BB was again made prn, as he is slow in SR. Today EKG shows atrial flutter with RVR at 135 bpm. He is not really aware of being out of rhythm but feels he may have been off for the last several weeks. 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 Denies any recent sickness.he continues on tikosyn and eliquis.   F/u in afib clinic, 11/12/20, after being found back in afib earlier in week and coming back for EKG's for rhythm check. He initially converted when metoprolol 12.5 mg bid was added. He continues in afib with RVR. I am concerned to push BB as he has a HR in the 40's in the past with low dose BB. He has been asymptomatic with bradycardia in the past and the only issue he has noted recently with the RVR is that he feels anxious in his chest at times. At his advanced age, I am concerned that he could deteriorate with RVR. He was suppose to drive to a wedding in [place] today but I have advised against this as I am afraid he may become lightheaded behind the wheel. He agrees not to go. He still works running his own business. I discussed with Dr. Rayann Heman and he feels that tikosyn should be stopped and amiodarone started. Pt is in agreement. Dr. Rayann Heman  would prefer to defer PPM at this point for tachy/brady.   F/u in afib clinic,  11/28/20. He is back in afib clinic  being loaded on amiodarone for failing Tikosyn and afib with RVR. He is now on amiodarone for around 10 days. He appears to be tolerating afib well. Ekg shows afib at 109 bpm. We discussed I will plan to cardiovert in another 2-3 weeks. No missed anticoagulation.  F/u in afib clinic, 12/10/20. He has now been on amiodarone for one month and will schedule for cardioversion. He is tolerating afib well. He is now on amiodarone 200  mg daily. He states no missed anticoagulation.   Today, he denies symptoms of palpitations,  shortness of breath, orthopnea, PND  dizziness, presyncope, syncope, or neurologic sequela. The patient is tolerating medications without difficulties and is otherwise without complaint today.   Past Medical History:  Diagnosis Date   Arthralgia    Benign positional vertigo    HTN (hypertension)    Hyperlipidemia    Impaired glucose tolerance    Persistent atrial fibrillation (HCC)    Senile purpura (HCC)    Past Surgical History:  Procedure Laterality Date   CARDIOVERSION N/A 03/29/2016   Procedure: CARDIOVERSION;  Surgeon: Thurmon Fair, MD;  Location: [facility];  Service: Cardiovascular;  Laterality: N/A;   CATARACT EXTRACTION W/ INTRAOCULAR LENS  IMPLANT, BILATERAL Bilateral 2017   ORIF METACARPAL FRACTURE Left ~ 2016   S/P fall   TONSILLECTOMY      Current Outpatient Medications  Medication Sig Dispense Refill   amiodarone (PACERONE) 200 MG tablet SUNDAY, 9/7 TAKE 1 TABLET TWICE A DAY FOR 14 DAYS THEN REDUCE TO 1 TABLET A DAY *STOP TIKOSYN 60 tablet 0   apixaban (ELIQUIS) 5 MG TABS tablet Take 1 tablet (5 mg total) by mouth 2 (two) times daily. 180 tablet 3   latanoprost (XALATAN) 0.005 % ophthalmic solution Place 1 drop into both eyes at bedtime.     rosuvastatin (CRESTOR) 20 MG tablet Take 20 mg by mouth daily.  5   No current facility-administered medications for this encounter.    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   Social Determinants of Health   Financial Resource Strain: Not on file  Food Insecurity: Not on file  Transportation Needs: Not on file  Physical Activity: Not on file  Stress: Not on file  Social Connections: Not on file  Intimate Partner Violence: Not on file    Family History  Problem Relation Age of Onset   Stroke Mother    Alzheimer's disease Mother    Alzheimer's disease Father     ROS- All systems are reviewed and negative except as per the HPI above  Physical Exam: Vitals:   12/10/20 1520  BP: (!) 150/86  Pulse: (!) 112  Weight: 76.7 kg  Height: 6\' 1"  (1.854 m)    Wt Readings from Last 3 Encounters:  12/10/20 76.7 kg  11/27/20 77.3 kg  11/20/20 78.7 kg    Labs: Lab Results  Component Value Date   NA 139 11/05/2020   K 4.6 11/05/2020   CL 109 11/05/2020   CO2 24 11/05/2020   GLUCOSE 154 (H) 11/05/2020   BUN 19 11/05/2020   CREATININE 1.14 11/05/2020   CALCIUM 9.1 11/05/2020   MG  2.2 11/05/2020   No results found for: INR No results found for: CHOL, HDL, LDLCALC, TRIG   GEN- The patient is well appearing, alert and oriented x 3 today.   Head- normocephalic, atraumatic Eyes-  Sclera clear, conjunctiva pink Ears- hearing intact Oropharynx- clear Neck- supple, no JVP Lymph- no cervical  lymphadenopathy Lungs- Clear to ausculation bilaterally, normal work of breathing. No tenderness of chest wall on palpation Heart-irregular  regular rate and rhythm, no murmurs, rubs or gallops, PMI not laterally displaced GI- soft, NT, ND, + BS Extremities- no clubbing, cyanosis, or  Mild edema around bilateral ankle area MS- no significant deformity or atrophy Skin- no rash or lesion Psych- euthymic mood, full affect Neuro- strength and sensation are intact  EKG-  a flutter with variable block  at 112 bpm, qrs int 100  ms, qtc 485 ms    Assessment and Plan: 1. Afib/flutter  RVR present today, but slower since starting amiodarone   Minimally symptomatic  Continue amiodarone  200 mg qd Continue apixaban 5 mg bid for chadsvasc score of 3 , states no missed doses for at least 3 weeks Will plan on cardioversion, risk vrs benefit of procedure explained  2.  HTN Stable   F/u  one week after cardioversion    Lupita Leash [facility] [address] [place], [place] [address] [phone]

## 2020-12-10 NOTE — Patient Instructions (Signed)
Continue Amiodarone 200mg  twice a day until follow up with  Cardioversion scheduled for Monday, October 17th  - Arrive at the October 19 and go to admitting at 830AM  - Do not eat or drink anything after midnight the night prior to your procedure.  - Take all your morning medication (except diabetic medications) with a sip of water prior to arrival.  - You will not be able to drive home after your procedure.  - Do NOT miss any doses of your blood thinner - if you should miss a dose please notify our office immediately.  - If you feel as if you go back into normal rhythm prior to scheduled cardioversion, please notify our office immediately. If your procedure is canceled in the cardioversion suite you will be charged a cancellation fee.  Patients will be asked to: to mask in public and hand hygiene (no longer quarantine) in the 3 days prior to surgery, to report if any COVID-19-like illness or household contacts to COVID-19 to determine need for testing

## 2020-12-10 NOTE — H&P (View-Only) (Signed)
Primary Care Physician: Geoffry Paradise, MD Referring Physician:Dr. Johney Frame   Sean Bowman is a 87 y.o. male with a h/o persistent afib that was diagnosed in December of 2017. He was started on anticoagulation and weeks later had DCCV which was unsuccessful. At time of cardioversion Cardizem was doubled for better rate control and simvastatin was stopped due to drug drug interaction concerns. He was later started on rosuvastatin.  Echo showed EF of 40-45%. It was decided that he would benefit from restoring SR with tikosyn with probable TMC. He was admitted for Tikosyn initiation.He returned to SR and qtc was stable. He was eventually taken off Cardizem and some ankle edema he had improved and he only takes lasix as needed. Left on low dose BB. BB was held at one point, and had return of afib.  F/u in afib clinic, 03/16/17, for general  f/u of tikosyn. He is staying in SR. He has had all over joint discomfort including the chest, shoulders and back. He saw a specialist and is thought to have some sort of arthritis and is on prednisone which does help with symptoms. No bleeding issues with eliquis.  F/u in afib clinic 4/22,he is doing well staying in SR with Tikosyn.  He has a HR in the 40's, which is chronic but is not symptomatic. 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 No bleeding issues with eliquis 5 mg bid.  F/u in afib clinic 8/28. He feels well. He again has a HR in the upper 40's but is asymptomatic. He is taking eliquis on a regular basis. Being compliant with dofetilide as well.  F/u in afib clinic, 12/14. He appears to be at his baseline. No afib to report just concerned re the slow HR, from  which he does not appear to be symptomatic. We have discussed in the past trying to stop the BB and today, he would like to pursue this. Reports some light nosebleeds.   F/u in afib clinic, 6/9. He feels well. He is staying in SR. No complaints other than very mild dizziness with  position change. He is not currently  on any medication that could contribute to this.   F/u 12/12/18. He reports that he feels well. No issues with afib. Continues on Tikosyn. No issues with anticoagulation.  F/i in afib clinic 03/15/19. He has not noted any afib. Continues on tikosyn and apixaban 5 mg bid for a CHA2DS2VASc of 3. No issues with bleeding. HR's in the 40's today that has been present in the past. He is not symptomatic nor on rate control.   F/u in afib clinic, 06/13/19.He remains in sinus brady which is  chronic and he tolerates well Has some mild LLE at end of the day that goes away by the next morning. No complaints today. Being compliant with tikosyn and eliquis. No afib to report.   F/u afib clinic, 09/12/19. No afib to report. Has a resting HR in the 40's but when walked down the hall, the HR picked up appropriately  to the mid 60's. He is not symptomatic with the bradycardia. The bradycardia is symptomatic and he is now on any AV nodal blocking drugs. He feels well. He will soon turn 89. No issues with DOAC, takes Tikosyn on a regular basis. He has both covid shots.    F/u in afib clinic, 12/17/19. Remains in sinus brady at 51 bpm and reports no afib awareness. He has c/o of dizziness from time to time  but is stating that is more noticeable lately. This is with sitting and standing. He is not on any AV nodal blocking drugs. His BP is elevated today, he does not monitor his HR or BP at home. I did walk him last time he was in the clinic and his HR did pick up appropriately with exercise.  Continues on eliquis, no bleeding issues, CHA2DS2VASc score of 3.   F/u in afib clinic 04/09/20. He saw Dr. Rayann Heman in November and pt had a low risk stress test. Dr. Rayann Heman also offered him a PPM, with his low HR's and  intermittent dizziness. Pt declined. He remains in SR with Tikosyn. EKg shows sinus brady at 50 bpm today. He has not noted much dizziness lately. He continues to  work. Qtc stable.   F/u  with afib clinic, 07/24/20. Ekg shows afib with rvr at 134 bpm.  He was unaware. He states that he came down suddenly with fatigue and head congestion 2 days ago. He denies fever, cough, shortness of breath with exertion. PO 97%.  He has been using OC decongestants.  CHA2DS2VASc score of 3. He continues on eliquis. He has not spoken to PCP re recent symptoms.   F/u  in afib clinic, 11/05/20. He did convert to SR with  BB added after last visit in May when he was asymptomatic with afib with RVR. He was sick at the time. After he converted , BB was again made prn, as he is slow in SR. Today EKG shows atrial flutter with RVR at 135 bpm. He is not really aware of being out of rhythm but feels he may have been off for the last several weeks. 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 Denies any recent sickness.he continues on tikosyn and eliquis.   F/u in afib clinic, 11/12/20, after being found back in afib earlier in week and coming back for EKG's for rhythm check. He initially converted when metoprolol 12.5 mg bid was added. He continues in afib with RVR. I am concerned to push BB as he has a HR in the 40's in the past with low dose BB. He has been asymptomatic with bradycardia in the past and the only issue he has noted recently with the RVR is that he feels anxious in his chest at times. At his advanced age, I am concerned that he could deteriorate with RVR. He was suppose to drive to a wedding in [place] today but I have advised against this as I am afraid he may become lightheaded behind the wheel. He agrees not to go. He still works running his own business. I discussed with Dr. Rayann Heman and he feels that tikosyn should be stopped and amiodarone started. Pt is in agreement. Dr. Rayann Heman  would prefer to defer PPM at this point for tachy/brady.   F/u in afib clinic,  11/28/20. He is back in afib clinic  being loaded on amiodarone for failing Tikosyn and afib with RVR. He is now on amiodarone for around 10 days. He appears to be tolerating afib well. Ekg shows afib at 109 bpm. We discussed I will plan to cardiovert in another 2-3 weeks. No missed anticoagulation.  F/u in afib clinic, 12/10/20. He has now been on amiodarone for one month and will schedule for cardioversion. He is tolerating afib well. He is now on amiodarone 200  mg daily. He states no missed anticoagulation.   Today, he denies symptoms of palpitations,  shortness of breath, orthopnea, PND  dizziness, presyncope, syncope, or neurologic sequela. The patient is tolerating medications without difficulties and is otherwise without complaint today.   Past Medical History:  Diagnosis Date   Arthralgia    Benign positional vertigo    HTN (hypertension)    Hyperlipidemia    Impaired glucose tolerance    Persistent atrial fibrillation (HCC)    Senile purpura (HCC)    Past Surgical History:  Procedure Laterality Date   CARDIOVERSION N/A 03/29/2016   Procedure: CARDIOVERSION;  Surgeon: Thurmon Fair, MD;  Location: [facility];  Service: Cardiovascular;  Laterality: N/A;   CATARACT EXTRACTION W/ INTRAOCULAR LENS  IMPLANT, BILATERAL Bilateral 2017   ORIF METACARPAL FRACTURE Left ~ 2016   S/P fall   TONSILLECTOMY      Current Outpatient Medications  Medication Sig Dispense Refill   amiodarone (PACERONE) 200 MG tablet SUNDAY, 9/7 TAKE 1 TABLET TWICE A DAY FOR 14 DAYS THEN REDUCE TO 1 TABLET A DAY *STOP TIKOSYN 60 tablet 0   apixaban (ELIQUIS) 5 MG TABS tablet Take 1 tablet (5 mg total) by mouth 2 (two) times daily. 180 tablet 3   latanoprost (XALATAN) 0.005 % ophthalmic solution Place 1 drop into both eyes at bedtime.     rosuvastatin (CRESTOR) 20 MG tablet Take 20 mg by mouth daily.  5   No current facility-administered medications for this encounter.    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   Social Determinants of Health   Financial Resource Strain: Not on file  Food Insecurity: Not on file  Transportation Needs: Not on file  Physical Activity: Not on file  Stress: Not on file  Social Connections: Not on file  Intimate Partner Violence: Not on file    Family History  Problem Relation Age of Onset   Stroke Mother    Alzheimer's disease Mother    Alzheimer's disease Father     ROS- All systems are reviewed and negative except as per the HPI above  Physical Exam: Vitals:   12/10/20 1520  BP: (!) 150/86  Pulse: (!) 112  Weight: 76.7 kg  Height: 6\' 1"  (1.854 m)    Wt Readings from Last 3 Encounters:  12/10/20 76.7 kg  11/27/20 77.3 kg  11/20/20 78.7 kg    Labs: Lab Results  Component Value Date   NA 139 11/05/2020   K 4.6 11/05/2020   CL 109 11/05/2020   CO2 24 11/05/2020   GLUCOSE 154 (H) 11/05/2020   BUN 19 11/05/2020   CREATININE 1.14 11/05/2020   CALCIUM 9.1 11/05/2020   MG  2.2 11/05/2020   No results found for: INR No results found for: CHOL, HDL, LDLCALC, TRIG   GEN- The patient is well appearing, alert and oriented x 3 today.   Head- normocephalic, atraumatic Eyes-  Sclera clear, conjunctiva pink Ears- hearing intact Oropharynx- clear Neck- supple, no JVP Lymph- no cervical  lymphadenopathy Lungs- Clear to ausculation bilaterally, normal work of breathing. No tenderness of chest wall on palpation Heart-irregular  regular rate and rhythm, no murmurs, rubs or gallops, PMI not laterally displaced GI- soft, NT, ND, + BS Extremities- no clubbing, cyanosis, or  Mild edema around bilateral ankle area MS- no significant deformity or atrophy Skin- no rash or lesion Psych- euthymic mood, full affect Neuro- strength and sensation are intact  EKG-  a flutter with variable block  at 112 bpm, qrs int 100  ms, qtc 485 ms    Assessment and Plan: 1. Afib/flutter  RVR present today, but slower since starting amiodarone   Minimally symptomatic  Continue amiodarone  200 mg qd Continue apixaban 5 mg bid for chadsvasc score of 3 , states no missed doses for at least 3 weeks Will plan on cardioversion, risk vrs benefit of procedure explained  2.  HTN Stable   F/u  one week after cardioversion    Lupita Leash [facility] [address] [place], [place] [address] [phone]

## 2020-12-11 ENCOUNTER — Encounter (HOSPITAL_COMMUNITY): Payer: Self-pay | Admitting: Cardiology

## 2020-12-11 NOTE — Progress Notes (Signed)
Attempted to obtain medical history via telephone, unable to reach at this time. I left a voicemail to return pre surgical testing department's phone call.  

## 2020-12-15 DIAGNOSIS — Z23 Encounter for immunization: Secondary | ICD-10-CM | POA: Diagnosis not present

## 2020-12-22 ENCOUNTER — Other Ambulatory Visit: Payer: Self-pay

## 2020-12-22 ENCOUNTER — Encounter (HOSPITAL_COMMUNITY): Payer: Self-pay | Admitting: Cardiology

## 2020-12-22 ENCOUNTER — Ambulatory Visit (HOSPITAL_COMMUNITY): Payer: Medicare Other | Admitting: Anesthesiology

## 2020-12-22 ENCOUNTER — Ambulatory Visit (HOSPITAL_COMMUNITY)
Admission: RE | Admit: 2020-12-22 | Discharge: 2020-12-22 | Disposition: A | Discharge status: Home / Self Care | Payer: Medicare Other | Attending: Cardiology | Admitting: Cardiology

## 2020-12-22 ENCOUNTER — Encounter (HOSPITAL_COMMUNITY)
Admission: RE | Disposition: A | Discharge status: Home / Self Care | Payer: Self-pay | Source: Home / Self Care | Attending: Cardiology

## 2020-12-22 DIAGNOSIS — I4819 Other persistent atrial fibrillation: Secondary | ICD-10-CM | POA: Insufficient documentation

## 2020-12-22 DIAGNOSIS — I4892 Unspecified atrial flutter: Secondary | ICD-10-CM | POA: Insufficient documentation

## 2020-12-22 DIAGNOSIS — Z7901 Long term (current) use of anticoagulants: Secondary | ICD-10-CM | POA: Insufficient documentation

## 2020-12-22 DIAGNOSIS — Z79899 Other long term (current) drug therapy: Secondary | ICD-10-CM | POA: Insufficient documentation

## 2020-12-22 DIAGNOSIS — I4891 Unspecified atrial fibrillation: Secondary | ICD-10-CM

## 2020-12-22 DIAGNOSIS — I10 Essential (primary) hypertension: Secondary | ICD-10-CM | POA: Diagnosis not present

## 2020-12-22 DIAGNOSIS — I739 Peripheral vascular disease, unspecified: Secondary | ICD-10-CM | POA: Diagnosis not present

## 2020-12-22 DIAGNOSIS — E785 Hyperlipidemia, unspecified: Secondary | ICD-10-CM | POA: Diagnosis not present

## 2020-12-22 HISTORY — PX: CARDIOVERSION: SHX1299

## 2020-12-22 SURGERY — CARDIOVERSION
Anesthesia: General

## 2020-12-22 MED ORDER — PROPOFOL 10 MG/ML IV BOLUS
INTRAVENOUS | Status: DC | PRN
  Administered 2020-12-22: 60 mg via INTRAVENOUS

## 2020-12-22 MED ORDER — SODIUM CHLORIDE 0.9 % IV SOLN: INTRAVENOUS | Status: DC | PRN

## 2020-12-22 MED ORDER — LIDOCAINE HCL (CARDIAC) PF 100 MG/5ML IV SOSY
PREFILLED_SYRINGE | INTRAVENOUS | Status: DC | PRN
  Administered 2020-12-22: 80 mg via INTRAVENOUS

## 2020-12-22 NOTE — Transfer of Care (Signed)
Immediate Anesthesia Transfer of Care Note  Patient: Sean Bowman  Procedure(s) Performed: CARDIOVERSION  Patient Location: PACU and Endoscopy Unit  Anesthesia Type:General  Level of Consciousness: awake, alert  and oriented  Airway & Oxygen Therapy: Patient Spontanous Breathing  Post-op Assessment: Report given to RN, Post -op Vital signs reviewed and stable, Patient moving all extremities X 4 and Patient able to stick tongue midline  Post vital signs: stable  Last Vitals:  Vitals Value Taken Time  BP 135/60   Temp 36.8   Pulse 64 12/22/20 0937  Resp 20 12/22/20 0937  SpO2 100 % 12/22/20 0937    Last Pain:  Vitals:   12/22/20 0842  TempSrc: Temporal  PainSc: 0-No pain         Complications: No notable events documented.

## 2020-12-22 NOTE — Anesthesia Preprocedure Evaluation (Addendum)
Anesthesia Evaluation  Patient identified by MRN, date of birth, ID band Patient awake    Reviewed: Allergy & Precautions, NPO status , Patient's Chart, lab work & pertinent test results  History of Anesthesia Complications Negative for: history of anesthetic complications  Airway Mallampati: II  TM Distance: >3 FB Neck ROM: Full    Dental no notable dental hx.    Pulmonary neg pulmonary ROS,    Pulmonary exam normal        Cardiovascular hypertension, + Peripheral Vascular Disease  Normal cardiovascular exam+ dysrhythmias (on Eliquis) Atrial Fibrillation   Nuclear stress 01/2020: EF 54%, low risk study    Neuro/Psych negative neurological ROS  negative psych ROS   GI/Hepatic negative GI ROS, Neg liver ROS,   Endo/Other  negative endocrine ROS  Renal/GU negative Renal ROS  negative genitourinary   Musculoskeletal negative musculoskeletal ROS (+)   Abdominal   Peds  Hematology negative hematology ROS (+)   Anesthesia Other Findings Day of surgery medications reviewed with patient.  Reproductive/Obstetrics negative OB ROS                            Anesthesia Physical Anesthesia Plan  ASA: 3  Anesthesia Plan: General   Post-op Pain Management:    Induction: Intravenous  PONV Risk Score and Plan: Treatment may vary due to age or medical condition and Propofol infusion  Airway Management Planned: Mask  Additional Equipment: None  Intra-op Plan:   Post-operative Plan:   Informed Consent: I have reviewed the patients History and Physical, chart, labs and discussed the procedure including the risks, benefits and alternatives for the proposed anesthesia with the patient or authorized representative who has indicated his/her understanding and acceptance.       Plan Discussed with: CRNA  Anesthesia Plan Comments:        Anesthesia Quick Evaluation

## 2020-12-22 NOTE — Discharge Instructions (Signed)

## 2020-12-22 NOTE — CV Procedure (Signed)
   Electrical Cardioversion Procedure Note Sean Bowman [id number] 18-Oct-1927  Procedure: Immunologist Fibrillationrdioversion Indications:    Time Out: Verified patient identification, verified procedure,medications/allergies/relevent history reviewed, required imaging and test results available.  Performed  Procedure Details  The patient signed informed consent.   The patient was NPO past midnight. Has had therapeutic anticoagulation with Eliquis 5 mg twice daily for greater than 3 weeks. The patient denies any interruption of anticoagulation.  Anesthesia was administered by Dr. Stephannie Bowman.  Adequate airway was maintained throughout and vital followed per protocol.  He was cardioverted x 1  with 200 J of biphasic synchronized energy.  He converted to NSR.  There were no apparent complications.  The patient tolerated the procedure well and had normal neuro status and respiratory status post procedure with vitals stable as recorded elsewhere.     IMPRESSION:  Successful cardioversion of atrial fibrillation   Follow up:  We will arrange follow up with Sean Coco, NP.  He will continue on current medical therapy.  The patient advised to continue anticoagulation.  Sean Bowman 12/22/2020, 9:37 AM

## 2020-12-22 NOTE — Anesthesia Postprocedure Evaluation (Signed)
Anesthesia Post Note  Patient: Sean Bowman  Procedure(s) Performed: CARDIOVERSION     Patient location during evaluation: PACU Anesthesia Type: General Level of consciousness: awake and alert and oriented Pain management: pain level controlled Vital Signs Assessment: post-procedure vital signs reviewed and stable Respiratory status: spontaneous breathing, nonlabored ventilation and respiratory function stable Cardiovascular status: blood pressure returned to baseline Postop Assessment: no apparent nausea or vomiting Anesthetic complications: no   No notable events documented.  Last Vitals:  Vitals:   12/22/20 1009 12/22/20 1014  BP: 98/64 98/66  Pulse: 63 64  Resp: 16 20  Temp:    SpO2: 96% 97%    Last Pain:  Vitals:   12/22/20 1009  TempSrc:   PainSc: 0-No pain                 Shanda Howells

## 2020-12-22 NOTE — Interval H&P Note (Signed)
History and Physical Interval Note:  12/22/2020 8:55 AM  Sean Bowman  has presented today for surgery, with the diagnosis of AFIB.  The various methods of treatment have been discussed with the patient and family. After consideration of risks, benefits and other options for treatment, the patient has consented to  Procedure(s): CARDIOVERSION (N/A) as a surgical intervention.  The patient's history has been reviewed, patient examined, no change in status, stable for surgery.  I have reviewed the patient's chart and labs.  Questions were answered to the patient's satisfaction.     Kayana Thoen

## 2020-12-24 ENCOUNTER — Encounter (HOSPITAL_COMMUNITY): Payer: Self-pay | Admitting: Cardiology

## 2020-12-30 ENCOUNTER — Ambulatory Visit (HOSPITAL_COMMUNITY): Payer: Medicare Other | Admitting: Nurse Practitioner

## 2021-01-01 ENCOUNTER — Other Ambulatory Visit: Payer: Self-pay

## 2021-01-01 ENCOUNTER — Ambulatory Visit (HOSPITAL_COMMUNITY)
Admission: RE | Admit: 2021-01-01 | Discharge: 2021-01-01 | Disposition: A | Discharge status: Home / Self Care | Payer: Medicare Other | Source: Ambulatory Visit | Attending: Nurse Practitioner | Admitting: Nurse Practitioner

## 2021-01-01 ENCOUNTER — Encounter (HOSPITAL_COMMUNITY): Payer: Self-pay | Admitting: Nurse Practitioner

## 2021-01-01 VITALS — BP 126/60 | HR 60 | Ht 73.0 in | Wt 170.4 lb

## 2021-01-01 DIAGNOSIS — D6869 Other thrombophilia: Secondary | ICD-10-CM | POA: Diagnosis not present

## 2021-01-01 DIAGNOSIS — I10 Essential (primary) hypertension: Secondary | ICD-10-CM | POA: Diagnosis not present

## 2021-01-01 DIAGNOSIS — Z79899 Other long term (current) drug therapy: Secondary | ICD-10-CM | POA: Diagnosis not present

## 2021-01-01 DIAGNOSIS — I4892 Unspecified atrial flutter: Secondary | ICD-10-CM | POA: Insufficient documentation

## 2021-01-01 DIAGNOSIS — Z7901 Long term (current) use of anticoagulants: Secondary | ICD-10-CM | POA: Diagnosis not present

## 2021-01-01 DIAGNOSIS — I4819 Other persistent atrial fibrillation: Secondary | ICD-10-CM

## 2021-01-01 MED ORDER — AMIODARONE HCL 200 MG PO TABS: 200.0000 mg | ORAL_TABLET | Freq: Every day | ORAL | Status: DC

## 2021-01-01 NOTE — Progress Notes (Signed)
Primary Care Physician: Geoffry Paradise, MD Referring Physician:Dr. Johney Frame   Sean Bowman is a 87 y.o. male with a h/o persistent afib that was diagnosed in December of 2017. He was started on anticoagulation and weeks later had DCCV which was unsuccessful. At time of cardioversion Cardizem was doubled for better rate control and simvastatin was stopped due to drug drug interaction concerns. He was later started on rosuvastatin.  Echo showed EF of 40-45%. It was decided that he would benefit from restoring SR with tikosyn with probable TMC. He was admitted for Tikosyn initiation.He returned to SR and qtc was stable. He was eventually taken off Cardizem and some ankle edema he had improved and he only takes lasix as needed. Left on low dose BB. BB was held at one point, and had return of afib.  F/u in afib clinic, 03/16/17, for general  f/u of tikosyn. He is staying in SR. He has had all over joint discomfort including the chest, shoulders and back. He saw a specialist and is thought to have some sort of arthritis and is on prednisone which does help with symptoms. No bleeding issues with eliquis.  F/u in afib clinic 4/22,he is doing well staying in SR with Tikosyn.  He has a HR in the 40's, which is chronic but is not symptomatic. 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 No bleeding issues with eliquis 5 mg bid.  F/u in afib clinic 8/28. He feels well. He again has a HR in the upper 40's but is asymptomatic. He is taking eliquis on a regular basis. Being compliant with dofetilide as well.  F/u in afib clinic, 12/14. He appears to be at his baseline. No afib to report just concerned re the slow HR, from  which he does not appear to be symptomatic. We have discussed in the past trying to stop the BB and today, he would like to pursue this. Reports some light nosebleeds.   F/u in afib clinic, 6/9. He feels well. He is staying in SR. No complaints other than very mild dizziness with  position change. He is not currently  on any medication that could contribute to this.   F/u 12/12/18. He reports that he feels well. No issues with afib. Continues on Tikosyn. No issues with anticoagulation.  F/i in afib clinic 03/15/19. He has not noted any afib. Continues on tikosyn and apixaban 5 mg bid for a CHA2DS2VASc of 3. No issues with bleeding. HR's in the 40's today that has been present in the past. He is not symptomatic nor on rate control.   F/u in afib clinic, 06/13/19.He remains in sinus brady which is  chronic and he tolerates well Has some mild LLE at end of the day that goes away by the next morning. No complaints today. Being compliant with tikosyn and eliquis. No afib to report.   F/u afib clinic, 09/12/19. No afib to report. Has a resting HR in the 40's but when walked down the hall, the HR picked up appropriately  to the mid 60's. He is not symptomatic with the bradycardia. The bradycardia is symptomatic and he is now on any AV nodal blocking drugs. He feels well. He will soon turn 89. No issues with DOAC, takes Tikosyn on a regular basis. He has both covid shots.    F/u in afib clinic, 12/17/19. Remains in sinus brady at 51 bpm and reports no afib awareness. He has c/o of dizziness from time to time  but is stating that is more noticeable lately. This is with sitting and standing. He is not on any AV nodal blocking drugs. His BP is elevated today, he does not monitor his HR or BP at home. I did walk him last time he was in the clinic and his HR did pick up appropriately with exercise.  Continues on eliquis, no bleeding issues, CHA2DS2VASc score of 3.   F/u in afib clinic 04/09/20. He saw Dr. Rayann Heman in November and pt had a low risk stress test. Dr. Rayann Heman also offered him a PPM, with his low HR's and  intermittent dizziness. Pt declined. He remains in SR with Tikosyn. EKg shows sinus brady at 50 bpm today. He has not noted much dizziness lately. He continues to  work. Qtc stable.   F/u  with afib clinic, 07/24/20. Ekg shows afib with rvr at 134 bpm.  He was unaware. He states that he came down suddenly with fatigue and head congestion 2 days ago. He denies fever, cough, shortness of breath with exertion. PO 97%.  He has been using OC decongestants.  CHA2DS2VASc score of 3. He continues on eliquis. He has not spoken to PCP re recent symptoms.   F/u  in afib clinic, 11/05/20. He did convert to SR with  BB added after last visit in May when he was asymptomatic with afib with RVR. He was sick at the time. After he converted , BB was again made prn, as he is slow in SR. Today EKG shows atrial flutter with RVR at 135 bpm. He is not really aware of being out of rhythm but feels he may have been off for the last several weeks. 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 Denies any recent sickness.he continues on tikosyn and eliquis.   F/u in afib clinic, 11/12/20, after being found back in afib earlier in week and coming back for EKG's for rhythm check. He initially converted when metoprolol 12.5 mg bid was added. He continues in afib with RVR. I am concerned to push BB as he has a HR in the 40's in the past with low dose BB. He has been asymptomatic with bradycardia in the past and the only issue he has noted recently with the RVR is that he feels anxious in his chest at times. At his advanced age, I am concerned that he could deteriorate with RVR. He was suppose to drive to a wedding in [place] today but I have advised against this as I am afraid he may become lightheaded behind the wheel. He agrees not to go. He still works running his own business. I discussed with Dr. Rayann Heman and he feels that tikosyn should be stopped and amiodarone started. Pt is in agreement. Dr. Rayann Heman  would prefer to defer PPM at this point for tachy/brady.   F/u in afib clinic,  11/28/20. He is back in afib clinic  being loaded on amiodarone for failing Tikosyn and afib with RVR. He is now on amiodarone for around 10 days. He appears to be tolerating afib well. Ekg shows afib at 109 bpm. We discussed I will plan to cardiovert in another 2-3 weeks. No missed anticoagulation.  F/u in afib clinic, 12/10/20. He has now been on amiodarone for one month and will schedule for cardioversion. He is tolerating afib well. He is now on amiodarone 200  mg daily. He states no missed anticoagulation.   F/u in afib clinic 10/27 after successful cardioversion and remains in SR today. He has not noted a lot of improvement but has noted less swelling in his ankles. His energy stayed good when he was in afib with RVR. He is on amiodarone 200 mg daily.   Today, he denies symptoms of palpitations,  shortness of breath, orthopnea, PND  dizziness, presyncope, syncope, or neurologic sequela. The patient is tolerating medications without difficulties and is otherwise without complaint today.   Past Medical History:  Diagnosis Date   Arthralgia    Benign positional vertigo    HTN (hypertension)    Hyperlipidemia    Impaired glucose tolerance    Persistent atrial fibrillation (HCC)    Senile purpura (HCC)    Past Surgical History:  Procedure Laterality Date   CARDIOVERSION N/A 03/29/2016   Procedure: CARDIOVERSION;  Surgeon: Thurmon Fair, MD;  Location: [facility];  Service: Cardiovascular;  Laterality: N/A;   CARDIOVERSION N/A 12/22/2020   Procedure: CARDIOVERSION;  Surgeon: Thomasene Ripple, DO;  Location: [facility];  Service: Cardiovascular;  Laterality: N/A;   CATARACT EXTRACTION W/ INTRAOCULAR LENS  IMPLANT, BILATERAL Bilateral 2017   ORIF METACARPAL FRACTURE Left ~ 2016   S/P fall   TONSILLECTOMY      Current Outpatient Medications  Medication Sig Dispense Refill   apixaban (ELIQUIS) 5 MG TABS tablet Take 1 tablet (5 mg total) by mouth 2 (two) times daily. 180 tablet 3   Ascorbic  Acid (VITAMIN C) 1000 MG tablet Take 1,000 mg by mouth daily.     carboxymethylcellulose (REFRESH PLUS) 0.5 % SOLN Place 1 drop into both eyes daily.     cholecalciferol (VITAMIN D3) 25 MCG (1000 UNIT) tablet Take 1,000 Units by mouth daily.     rosuvastatin (CRESTOR) 20 MG tablet Take 20 mg by mouth daily.  5   amiodarone (PACERONE) 200 MG tablet Take 1 tablet (200 mg total) by mouth daily.     No current facility-administered medications for this encounter.    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   Social Determinants of Health   Financial Resource Strain: Not on file  Food Insecurity: Not on file  Transportation Needs: Not on file  Physical Activity: Not on file  Stress: Not on file  Social Connections: Not on file  Intimate Partner Violence: Not on file    Family History  Problem Relation Age of Onset   Stroke Mother    Alzheimer's disease Mother    Alzheimer's disease Father     ROS- All systems are reviewed and negative except as per the HPI above  Physical Exam: Vitals:  01/01/21 1454  BP: 126/60  Pulse: 60  Weight: 77.3 kg  Height: 6\' 1"  (1.854 m)    Wt Readings from Last 3 Encounters:  01/01/21 77.3 kg  12/22/20 76.7 kg  12/10/20 76.7 kg    Labs: Lab Results  Component Value Date   NA 140  12/10/2020   K 4.4 12/10/2020   CL 105 12/10/2020   CO2 27 12/10/2020   GLUCOSE 104 (H) 12/10/2020   BUN 16 12/10/2020   CREATININE 1.18 12/10/2020   CALCIUM 9.1 12/10/2020   MG 2.2 11/05/2020   No results found for: INR No results found for: CHOL, HDL, LDLCALC, TRIG   GEN- The patient is well appearing, alert and oriented x 3 today.   Head- normocephalic, atraumatic Eyes-  Sclera clear, conjunctiva pink Ears- hearing intact Oropharynx- clear Neck- supple, no JVP Lymph- no cervical lymphadenopathy Lungs- Clear to ausculation bilaterally, normal work of breathing. No tenderness of chest wall on palpation Heart-irregular  regular rate and rhythm, no murmurs, rubs or gallops, PMI not laterally displaced GI- soft, NT, ND, + BS Extremities- no clubbing, cyanosis, or  Mild edema around bilateral ankle area MS- no significant deformity or atrophy Skin- no rash or lesion Psych- euthymic mood, full affect Neuro- strength and sensation are intact  EKG- SR at 60 bpm, pr int 200 ms, qrs int 108 ms, qtc 470 ms    Assessment and Plan: 1. Afib/flutter  Failed tikosyn, it was stopped and then pt loaded  on amiodarone Successful cardioversion and remains in SR   Continue amiodarone  200 mg qd Continue apixaban 5 mg bid for chadsvasc score of 3   2.  HTN Stable   F/u  here in one month    11/07/2020 [facility] [address] [place], [place] [phone]

## 2021-01-11 ENCOUNTER — Other Ambulatory Visit (HOSPITAL_COMMUNITY): Payer: Self-pay | Admitting: Nurse Practitioner

## 2021-02-04 ENCOUNTER — Encounter (HOSPITAL_COMMUNITY): Payer: Self-pay | Admitting: Nurse Practitioner

## 2021-02-04 ENCOUNTER — Ambulatory Visit (HOSPITAL_COMMUNITY)
Admission: RE | Admit: 2021-02-04 | Discharge: 2021-02-04 | Disposition: A | Discharge status: Home / Self Care | Payer: Medicare Other | Source: Ambulatory Visit | Attending: Nurse Practitioner | Admitting: Nurse Practitioner

## 2021-02-04 VITALS — BP 140/60 | HR 53 | Ht 73.0 in | Wt 166.0 lb

## 2021-02-04 DIAGNOSIS — Z7901 Long term (current) use of anticoagulants: Secondary | ICD-10-CM | POA: Insufficient documentation

## 2021-02-04 DIAGNOSIS — D6869 Other thrombophilia: Secondary | ICD-10-CM

## 2021-02-04 DIAGNOSIS — I10 Essential (primary) hypertension: Secondary | ICD-10-CM | POA: Insufficient documentation

## 2021-02-04 DIAGNOSIS — I4819 Other persistent atrial fibrillation: Secondary | ICD-10-CM

## 2021-02-04 LAB — COMPREHENSIVE METABOLIC PANEL
ALT: 20 U/L (ref 0–44)
AST: 21 U/L (ref 15–41)
Albumin: 3.5 g/dL (ref 3.5–5.0)
Alkaline Phosphatase: 52 U/L (ref 38–126)
Anion gap: 4 — ABNORMAL LOW (ref 5–15)
BUN: 17 mg/dL (ref 8–23)
CO2: 26 mmol/L (ref 22–32)
Calcium: 8.7 mg/dL — ABNORMAL LOW (ref 8.9–10.3)
Chloride: 107 mmol/L (ref 98–111)
Creatinine, Ser: 1.06 mg/dL (ref 0.61–1.24)
GFR, Estimated: >60 mL/min (ref >60)
Glucose, Bld: 123 mg/dL — ABNORMAL HIGH (ref 70–99)
Potassium: 4.5 mmol/L (ref 3.5–5.1)
Sodium: 137 mmol/L (ref 135–145)
Total Bilirubin: 0.5 mg/dL (ref 0.3–1.2)
Total Protein: 6 g/dL — ABNORMAL LOW (ref 6.5–8.1)

## 2021-02-04 LAB — TSH: TSH: 1.25 u[IU]/mL (ref 0.350–4.500)

## 2021-02-04 NOTE — Progress Notes (Signed)
Primary Care Physician: Geoffry Paradise, MD Referring Physician:Dr. Johney Frame   Sean Bowman is a 87 y.o. male with a h/o persistent afib that was diagnosed in December of 2017. He was started on anticoagulation and weeks later had DCCV which was unsuccessful. At time of cardioversion Cardizem was doubled for better rate control and simvastatin was stopped due to drug drug interaction concerns. He was later started on rosuvastatin.  Echo showed EF of 40-45%. It was decided that he would benefit from restoring SR with tikosyn with probable TMC. He was admitted for Tikosyn initiation.He returned to SR and qtc was stable. He was eventually taken off Cardizem and some ankle edema he had improved and he only takes lasix as needed. Left on low dose BB. BB was held at one point, and had return of afib.  F/u in afib clinic, 03/16/17, for general  f/u of tikosyn. He is staying in SR. He has had all over joint discomfort including the chest, shoulders and back. He saw a specialist and is thought to have some sort of arthritis and is on prednisone which does help with symptoms. No bleeding issues with eliquis.  F/u in afib clinic 4/22,he is doing well staying in SR with Tikosyn.  He has a HR in the 40's, which is chronic but is not symptomatic. 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 No bleeding issues with eliquis 5 mg bid.  F/u in afib clinic 8/28. He feels well. He again has a HR in the upper 40's but is asymptomatic. He is taking eliquis on a regular basis. Being compliant with dofetilide as well.  F/u in afib clinic, 12/14. He appears to be at his baseline. No afib to report just concerned re the slow HR, from  which he does not appear to be symptomatic. We have discussed in the past trying to stop the BB and today, he would like to pursue this. Reports some light nosebleeds.   F/u in afib clinic, 6/9. He feels well. He is staying in SR. No complaints other than very mild dizziness with  position change. He is not currently  on any medication that could contribute to this.   F/u 12/12/18. He reports that he feels well. No issues with afib. Continues on Tikosyn. No issues with anticoagulation.  F/i in afib clinic 03/15/19. He has not noted any afib. Continues on tikosyn and apixaban 5 mg bid for a CHA2DS2VASc of 3. No issues with bleeding. HR's in the 40's today that has been present in the past. He is not symptomatic nor on rate control.   F/u in afib clinic, 06/13/19.He remains in sinus brady which is  chronic and he tolerates well Has some mild LLE at end of the day that goes away by the next morning. No complaints today. Being compliant with tikosyn and eliquis. No afib to report.   F/u afib clinic, 09/12/19. No afib to report. Has a resting HR in the 40's but when walked down the hall, the HR picked up appropriately  to the mid 60's. He is not symptomatic with the bradycardia. The bradycardia is symptomatic and he is now on any AV nodal blocking drugs. He feels well. He will soon turn 89. No issues with DOAC, takes Tikosyn on a regular basis. He has both covid shots.    F/u in afib clinic, 12/17/19. Remains in sinus brady at 51 bpm and reports no afib awareness. He has c/o of dizziness from time to time  but is stating that is more noticeable lately. This is with sitting and standing. He is not on any AV nodal blocking drugs. His BP is elevated today, he does not monitor his HR or BP at home. I did walk him last time he was in the clinic and his HR did pick up appropriately with exercise.  Continues on eliquis, no bleeding issues, CHA2DS2VASc score of 3.   F/u in afib clinic 04/09/20. He saw Dr. Rayann Heman in November and pt had a low risk stress test. Dr. Rayann Heman also offered him a PPM, with his low HR's and  intermittent dizziness. Pt declined. He remains in SR with Tikosyn. EKg shows sinus brady at 50 bpm today. He has not noted much dizziness lately. He continues to  work. Qtc stable.   F/u  with afib clinic, 07/24/20. Ekg shows afib with rvr at 134 bpm.  He was unaware. He states that he came down suddenly with fatigue and head congestion 2 days ago. He denies fever, cough, shortness of breath with exertion. PO 97%.  He has been using OC decongestants.  CHA2DS2VASc score of 3. He continues on eliquis. He has not spoken to PCP re recent symptoms.   F/u  in afib clinic, 11/05/20. He did convert to SR with  BB added after last visit in May when he was asymptomatic with afib with RVR. He was sick at the time. After he converted , BB was again made prn, as he is slow in SR. Today EKG shows atrial flutter with RVR at 135 bpm. He is not really aware of being out of rhythm but feels he may have been off for the last several weeks. 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 Denies any recent sickness.he continues on tikosyn and eliquis.   F/u in afib clinic, 11/12/20, after being found back in afib earlier in week and coming back for EKG's for rhythm check. He initially converted when metoprolol 12.5 mg bid was added. He continues in afib with RVR. I am concerned to push BB as he has a HR in the 40's in the past with low dose BB. He has been asymptomatic with bradycardia in the past and the only issue he has noted recently with the RVR is that he feels anxious in his chest at times. At his advanced age, I am concerned that he could deteriorate with RVR. He was suppose to drive to a wedding in [place] today but I have advised against this as I am afraid he may become lightheaded behind the wheel. He agrees not to go. He still works running his own business. I discussed with Dr. Rayann Heman and he feels that tikosyn should be stopped and amiodarone started. Pt is in agreement. Dr. Rayann Heman  would prefer to defer PPM at this point for tachy/brady.   F/u in afib clinic,  11/28/20. He is back in afib clinic  being loaded on amiodarone for failing Tikosyn and afib with RVR. He is now on amiodarone for around 10 days. He appears to be tolerating afib well. Ekg shows afib at 109 bpm. We discussed I will plan to cardiovert in another 2-3 weeks. No missed anticoagulation.  F/u in afib clinic, 12/10/20. He has now been on amiodarone for one month and will schedule for cardioversion. He is tolerating afib well. He is now on amiodarone 200  mg daily. He states no missed anticoagulation.   F/u in afib clinic 10/27 after successful cardioversion and remains in SR today. He has not noted a lot of improvement but has noted less swelling in his ankles. His energy stayed good when he was in afib with RVR. He is on amiodarone 200 mg daily.   F/u in afib clinic, 11/30, he remains in [place]. He feels well.continues on amiodarone 200 mg daily.   Today, he denies symptoms of palpitations,  shortness of breath, orthopnea, PND  dizziness, presyncope, syncope, or neurologic sequela. The patient is tolerating medications without difficulties and is otherwise without complaint today.   Past Medical History:  Diagnosis Date   Arthralgia    Benign positional vertigo    HTN (hypertension)    Hyperlipidemia    Impaired glucose tolerance    Persistent atrial fibrillation (HCC)    Senile purpura (HCC)    Past Surgical History:  Procedure Laterality Date   CARDIOVERSION N/A 03/29/2016   Procedure: CARDIOVERSION;  Surgeon: Sanda Klein, MD;  Location: [place];  Service: Cardiovascular;  Laterality: N/A;   CARDIOVERSION N/A 12/22/2020   Procedure: CARDIOVERSION;  Surgeon: Berniece Salines, DO;  Location: [facility];  Service: Cardiovascular;  Laterality: N/A;   CATARACT EXTRACTION W/ INTRAOCULAR LENS  IMPLANT, BILATERAL Bilateral 2017   ORIF METACARPAL FRACTURE Left ~ 2016   S/P fall   TONSILLECTOMY      Current Outpatient Medications  Medication Sig Dispense Refill   amiodarone  (PACERONE) 200 MG tablet Take 1 tablet (200 mg total) by mouth daily. 60 tablet 6   apixaban (ELIQUIS) 5 MG TABS tablet Take 1 tablet (5 mg total) by mouth 2 (two) times daily. 180 tablet 3   Ascorbic Acid (VITAMIN C) 1000 MG tablet Take 1,000 mg by mouth daily.     carboxymethylcellulose (REFRESH PLUS) 0.5 % SOLN Place 1 drop into both eyes daily.     cholecalciferol (VITAMIN D3) 25 MCG (1000 UNIT) tablet Take 1,000 Units by mouth daily.     rosuvastatin (CRESTOR) 20 MG tablet Take 20 mg by mouth daily.  5   No current facility-administered medications for this encounter.    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   Social Determinants of Health   Financial Resource Strain: Not on file  Food Insecurity: Not on file  Transportation Needs: Not on file  Physical Activity: Not on file  Stress: Not on file  Social Connections: Not on file  Intimate Partner Violence: Not on file    Family History  Problem Relation Age of Onset   Stroke Mother    Alzheimer's disease Mother    Alzheimer's disease Father  ROS- All systems are reviewed and negative except as per the HPI above  Physical Exam: Vitals:   02/04/21 0838  Weight: 75.3 kg  Height: 6\' 1"  (1.854 m)    Wt Readings from Last 3 Encounters:  02/04/21 75.3 kg   01/01/21 77.3 kg  12/22/20 76.7 kg    Labs: Lab Results  Component Value Date   NA 140 12/10/2020   K 4.4 12/10/2020   CL 105 12/10/2020   CO2 27 12/10/2020   GLUCOSE 104 (H) 12/10/2020   BUN 16 12/10/2020   CREATININE 1.18 12/10/2020   CALCIUM 9.1 12/10/2020   MG 2.2 11/05/2020   No results found for: INR No results found for: CHOL, HDL, LDLCALC, TRIG   GEN- The patient is well appearing, alert and oriented x 3 today.   Head- normocephalic, atraumatic Eyes-  Sclera clear, conjunctiva pink Ears- hearing intact Oropharynx- clear Neck- supple, no JVP Lymph- no cervical lymphadenopathy Lungs- Clear to ausculation bilaterally, normal work of breathing. No tenderness of chest wall on palpation Heart-irregular  regular rate and rhythm, no murmurs, rubs or gallops, PMI not laterally displaced GI- soft, NT, ND, + BS Extremities- no clubbing, cyanosis, or  Mild edema around bilateral ankle area MS- no significant deformity or atrophy Skin- no rash or lesion Psych- euthymic mood, full affect Neuro- strength and sensation are intact  EKG- unusual p axis, probable sinus rhythm, pr int 206 ms, qrs int 104 ms, qtc 457 ms   Assessment and Plan: 1. Afib/flutter  Failed tikosyn, it was stopped and then pt loaded  on amiodarone Successful cardioversion and remains in SR   Continue amiodarone  200 mg qd Continue apixaban 5 mg bid for chadsvasc score of 3  Cmet,tsh today   2.  HTN Stable   F/u here in 3 months    Butch Penny C. Donyae Kilner, [facility] [address] [place], [place] [address] [phone]

## 2021-02-16 DIAGNOSIS — H401131 Primary open-angle glaucoma, bilateral, mild stage: Secondary | ICD-10-CM | POA: Diagnosis not present

## 2021-02-16 DIAGNOSIS — H0016 Chalazion left eye, unspecified eyelid: Secondary | ICD-10-CM | POA: Diagnosis not present

## 2021-02-18 ENCOUNTER — Other Ambulatory Visit (HOSPITAL_COMMUNITY): Payer: Self-pay | Admitting: Nurse Practitioner

## 2021-03-04 DIAGNOSIS — I10 Essential (primary) hypertension: Secondary | ICD-10-CM | POA: Diagnosis not present

## 2021-03-04 DIAGNOSIS — S0003XA Contusion of scalp, initial encounter: Secondary | ICD-10-CM | POA: Diagnosis not present

## 2021-03-04 DIAGNOSIS — W108XXA Fall (on) (from) other stairs and steps, initial encounter: Secondary | ICD-10-CM | POA: Diagnosis not present

## 2021-03-04 DIAGNOSIS — I480 Paroxysmal atrial fibrillation: Secondary | ICD-10-CM | POA: Diagnosis not present

## 2021-03-13 ENCOUNTER — Other Ambulatory Visit (HOSPITAL_COMMUNITY): Payer: Self-pay | Admitting: *Deleted

## 2021-04-01 DIAGNOSIS — E785 Hyperlipidemia, unspecified: Secondary | ICD-10-CM | POA: Diagnosis not present

## 2021-04-01 DIAGNOSIS — D692 Other nonthrombocytopenic purpura: Secondary | ICD-10-CM | POA: Diagnosis not present

## 2021-04-01 DIAGNOSIS — I480 Paroxysmal atrial fibrillation: Secondary | ICD-10-CM | POA: Diagnosis not present

## 2021-04-01 DIAGNOSIS — I10 Essential (primary) hypertension: Secondary | ICD-10-CM | POA: Diagnosis not present

## 2021-04-07 DIAGNOSIS — H401131 Primary open-angle glaucoma, bilateral, mild stage: Secondary | ICD-10-CM | POA: Diagnosis not present

## 2021-04-07 DIAGNOSIS — Z961 Presence of intraocular lens: Secondary | ICD-10-CM | POA: Diagnosis not present

## 2021-04-07 DIAGNOSIS — H18413 Arcus senilis, bilateral: Secondary | ICD-10-CM | POA: Diagnosis not present

## 2021-04-07 DIAGNOSIS — H04123 Dry eye syndrome of bilateral lacrimal glands: Secondary | ICD-10-CM | POA: Diagnosis not present

## 2021-04-08 ENCOUNTER — Other Ambulatory Visit: Payer: Self-pay

## 2021-04-08 ENCOUNTER — Ambulatory Visit (HOSPITAL_COMMUNITY)
Admission: RE | Admit: 2021-04-08 | Discharge: 2021-04-08 | Disposition: A | Discharge status: Home / Self Care | Payer: Medicare Other | Source: Ambulatory Visit | Attending: Nurse Practitioner | Admitting: Nurse Practitioner

## 2021-04-08 ENCOUNTER — Encounter (HOSPITAL_COMMUNITY): Payer: Self-pay | Admitting: Nurse Practitioner

## 2021-04-08 VITALS — BP 188/74 | HR 45 | Ht 73.0 in | Wt 165.4 lb

## 2021-04-08 DIAGNOSIS — I4891 Unspecified atrial fibrillation: Secondary | ICD-10-CM

## 2021-04-08 DIAGNOSIS — I4892 Unspecified atrial flutter: Secondary | ICD-10-CM | POA: Diagnosis not present

## 2021-04-08 DIAGNOSIS — Z79899 Other long term (current) drug therapy: Secondary | ICD-10-CM | POA: Diagnosis not present

## 2021-04-08 DIAGNOSIS — I4819 Other persistent atrial fibrillation: Secondary | ICD-10-CM | POA: Diagnosis not present

## 2021-04-08 DIAGNOSIS — D6869 Other thrombophilia: Secondary | ICD-10-CM | POA: Diagnosis not present

## 2021-04-08 DIAGNOSIS — I10 Essential (primary) hypertension: Secondary | ICD-10-CM | POA: Diagnosis not present

## 2021-04-08 NOTE — Progress Notes (Signed)
Primary Care Physician: Burnard Bunting, MD Referring Physician:Dr. Rayann Heman   Sean Bowman is a 88 y.o. male with a h/o persistent afib that was diagnosed in December of 2017. He was started on anticoagulation and weeks later had DCCV which was unsuccessful. At time of cardioversion Cardizem was doubled for better rate control and simvastatin was stopped due to drug drug interaction concerns. He was later started on rosuvastatin.  Echo showed EF of 40-45%. It was decided that he would benefit from restoring SR with tikosyn with probable TMC. He was admitted for Tikosyn initiation.He returned to SR and qtc was stable. He was eventually taken off Cardizem and some ankle edema he had improved and he only takes lasix as needed. Left on low dose BB. BB was held at one point, and had return of afib.  F/u in afib clinic, 03/16/17, for general  f/u of tikosyn. He is staying in [place]. He has had all over joint discomfort including the chest, shoulders and back. He saw a specialist and is thought to have some sort of arthritis and is on prednisone which does help with symptoms. No bleeding issues with eliquis.  F/u in afib clinic 4/22,he is doing well staying in [place] with Tikosyn.  He has a HR in the 40's, which is chronic but is not symptomatic. 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 No bleeding issues with eliquis 5 mg bid.  F/u in afib clinic 8/28. He feels well. He again has a HR in the upper 40's but is asymptomatic. He is taking eliquis on a regular basis. Being compliant with dofetilide as well.  F/u in afib clinic, 12/14. He appears to be at his baseline. No afib to report just concerned re the slow HR, from  which he does not appear to be symptomatic. We have discussed in the past trying to stop the BB and today, he would like to pursue this. Reports some light nosebleeds.   F/u in afib clinic, 6/9. He feels well. He is staying in [place]. No complaints other than very mild dizziness with  position change. He is not currently  on any medication that could contribute to this.   F/u 12/12/18. He reports that he feels well. No issues with afib. Continues on Tikosyn. No issues with anticoagulation.  F/i in afib clinic 03/15/19. He has not noted any afib. Continues on tikosyn and apixaban 5 mg bid for a CHA2DS2VASc of 3. No issues with bleeding. HR's in the 40's today that has been present in the past. He is not symptomatic nor on rate control.   F/u in afib clinic, 06/13/19.He remains in sinus brady which is  chronic and he tolerates well Has some mild LLE at end of the day that goes away by the next morning. No complaints today. Being compliant with tikosyn and eliquis. No afib to report.   F/u afib clinic, 09/12/19. No afib to report. Has a resting HR in the 40's but when walked down the hall, the HR picked up appropriately  to the mid 60's. He is not symptomatic with the bradycardia. The bradycardia is symptomatic and he is now on any AV nodal blocking drugs. He feels well. He will soon turn 89. No issues with DOAC, takes Tikosyn on a regular basis. He has both covid shots.    F/u in afib clinic, 12/17/19. Remains in sinus brady at 51 bpm and reports no afib awareness. He has c/o of dizziness from time to time  but is stating that is more noticeable lately. This is with sitting and standing. He is not on any AV nodal blocking drugs. His BP is elevated today, he does not monitor his HR or BP at home. I did walk him last time he was in the clinic and his HR did pick up appropriately with exercise.  Continues on eliquis, no bleeding issues, CHA2DS2VASc score of 3.   F/u in afib clinic 04/09/20. He saw Dr. Rayann Heman in November and pt had a low risk stress test. Dr. Rayann Heman also offered him a PPM, with his low HR's and  intermittent dizziness. Pt declined. He remains in SR with Tikosyn. EKg shows sinus brady at 50 bpm today. He has not noted much dizziness lately. He continues to  work. Qtc stable.   F/u  with afib clinic, 07/24/20. Ekg shows afib with rvr at 134 bpm.  He was unaware. He states that he came down suddenly with fatigue and head congestion 2 days ago. He denies fever, cough, shortness of breath with exertion. PO 97%.  He has been using OC decongestants.  CHA2DS2VASc score of 3. He continues on eliquis. He has not spoken to PCP re recent symptoms.   F/u  in afib clinic, 11/05/20. He did convert to SR with  BB added after last visit in May when he was asymptomatic with afib with RVR. He was sick at the time. After he converted , BB was again made prn, as he is slow in SR. Today EKG shows atrial flutter with RVR at 135 bpm. He is not really aware of being out of rhythm but feels he may have been off for the last several weeks. 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 Denies any recent sickness.he continues on tikosyn and eliquis.   F/u in afib clinic, 11/12/20, after being found back in afib earlier in week and coming back for EKG's for rhythm check. He initially converted when metoprolol 12.5 mg bid was added. He continues in afib with RVR. I am concerned to push BB as he has a HR in the 40's in the past with low dose BB. He has been asymptomatic with bradycardia in the past and the only issue he has noted recently with the RVR is that he feels anxious in his chest at times. At his advanced age, I am concerned that he could deteriorate with RVR. He was suppose to drive to a wedding in [place] today but I have advised against this as I am afraid he may become lightheaded behind the wheel. He agrees not to go. He still works running his own business. I discussed with Dr. Rayann Heman and he feels that tikosyn should be stopped and amiodarone started. Pt is in agreement. Dr. Rayann Heman  would prefer to defer PPM at this point for tachy/brady.   F/u in afib clinic,  11/28/20. He is back in afib clinic  being loaded on amiodarone for failing Tikosyn and afib with RVR. He is now on amiodarone for around 10 days. He appears to be tolerating afib well. Ekg shows afib at 109 bpm. We discussed I will plan to cardiovert in another 2-3 weeks. No missed anticoagulation.  F/u in afib clinic, 12/10/20. He has now been on amiodarone for one month and will schedule for cardioversion. He is tolerating afib well. He is now on amiodarone 200  mg daily. He states no missed anticoagulation.   F/u in afib clinic 10/27 after successful cardioversion and remains in SR today. He has not noted a lot of improvement but has noted less swelling in his ankles. His energy stayed good when he was in afib with RVR. He is on amiodarone 200 mg daily.   F/u in afib clinic, 11/30, he remains in [place]. He feels well.Continues on amiodarone 200 mg daily.   Pt  being seen today ,04/08/21, for his PCP picking up an elevated HR on his visit last week. He did not note anything abnormal. His ekg today shows sinus brady at 45 bpm. He is not symptomatic with this. His BP is elevated today. He has just returned  from a cruise and has had tro bring in food as his wife hurt her leg and has not been able to cook.   Today, he denies symptoms of palpitations,  shortness of breath, orthopnea, PND  dizziness, presyncope, syncope, or neurologic sequela. The patient is tolerating medications without difficulties and is otherwise without complaint today.   Past Medical History:  Diagnosis Date   Arthralgia    Benign positional vertigo    HTN (hypertension)    Hyperlipidemia    Impaired glucose tolerance    Persistent atrial fibrillation (HCC)    Senile purpura (HCC)    Past Surgical History:  Procedure Laterality Date   CARDIOVERSION N/A 03/29/2016   Procedure: CARDIOVERSION;  Surgeon: Sanda Klein, MD;  Location: [facility];  Service: Cardiovascular;  Laterality: N/A;   CARDIOVERSION N/A 12/22/2020    Procedure: CARDIOVERSION;  Surgeon: Berniece Salines, DO;  Location: [facility];  Service: Cardiovascular;  Laterality: N/A;   CATARACT EXTRACTION W/ INTRAOCULAR LENS  IMPLANT, BILATERAL Bilateral 2017   ORIF METACARPAL FRACTURE Left ~ 2016   S/P fall   TONSILLECTOMY      Current Outpatient Medications  Medication Sig Dispense Refill   amiodarone (PACERONE) 200 MG tablet Take 1 tablet (200 mg total) by mouth daily. 60 tablet 6   apixaban (ELIQUIS) 5 MG TABS tablet Take 1 tablet (5 mg total) by mouth 2 (two) times daily. 180 tablet 3   Ascorbic Acid (VITAMIN C) 1000 MG tablet Take 1,000 mg by mouth daily.     carboxymethylcellulose (REFRESH PLUS) 0.5 % SOLN Place 1 drop into both eyes daily.     cholecalciferol (VITAMIN D3) 25 MCG (1000 UNIT) tablet Take 1,000 Units by mouth daily.     latanoprost (XALATAN) 0.005 % ophthalmic solution SMARTSIG:1 Drop(s) In Eye(s) Every Evening     rosuvastatin (CRESTOR) 20 MG tablet Take 20 mg by mouth daily.  5   No current facility-administered medications for this encounter.    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   Social Determinants of Health   Financial Resource Strain: Not on file  Food Insecurity: Not on file  Transportation Needs: Not on file  Physical Activity: Not on file  Stress:  Not on file  Social Connections: Not on file  Intimate Partner Violence: Not on file    Family History  Problem Relation Age of Onset   Stroke Mother    Alzheimer's disease Mother    Alzheimer's disease Father     ROS- All systems are reviewed and negative except as per the HPI above  Physical Exam: Vitals:   04/08/21 1124  BP: (!) 188/74  Pulse: (!) 45  Weight: 75 kg  Height: 6\' 1"  (1.854 m)    Wt Readings from Last 3 Encounters:  04/08/21 75 kg  02/04/21 75.3 kg  01/01/21 77.3 kg    Labs: Lab Results  Component Value Date   NA 137 02/04/2021   K 4.5 02/04/2021   CL 107 02/04/2021   CO2 26 02/04/2021   GLUCOSE 123 (H) 02/04/2021   BUN 17 02/04/2021   CREATININE 1.06 02/04/2021   CALCIUM 8.7 (L) 02/04/2021   MG 2.2 11/05/2020   No results found for: INR No results found for: CHOL, HDL, LDLCALC, TRIG   GEN- The patient is well appearing, alert and oriented x 3 today.   Head- normocephalic, atraumatic Eyes-  Sclera clear, conjunctiva pink Ears- hearing intact Oropharynx- clear Neck- supple, no JVP Lymph- no cervical lymphadenopathy Lungs- Clear to ausculation bilaterally, normal work of breathing. No tenderness of chest wall on palpation Heart-irregular  regular rate and rhythm, no murmurs, rubs or gallops, PMI not laterally displaced GI- soft, NT, ND, + BS Extremities- no clubbing, cyanosis, or  Mild edema around bilateral ankle area MS- no significant deformity or atrophy Skin- no rash or lesion Psych- euthymic mood, full affect Neuro- strength and sensation are intact  EKG-  sinus arrhythmia with sinus arrhythmia, at 44 bpm, pr int 196 ms, qrs int 104 ms, qtc 437 ms    Assessment and Plan: 1. Afib/flutter  Failed tikosyn, it was stopped and then pt loaded  on amiodarone Successful cardioversion 10/17 and remains in SR   PCP picked up elevated HR last week but in snus rhythm today  Continue amiodarone  200 mg qd Continue apixaban 5 mg bid for  chadsvasc score of 3   2.  HTN  Elevated  188/74 on arrival and on recheck 160/80 He sounds as he gets high salt intake for bringing all his meals in and just coming off a cruise Reduce salt intake   He does not check BP at home so will see back next week for a BP check and ekg to f/u bradycardia      Butch Penny C. Dorthea Maina, [facility] [address] [place], [place] [address] [phone]

## 2021-04-15 ENCOUNTER — Encounter (HOSPITAL_COMMUNITY): Payer: Medicare Other | Admitting: Nurse Practitioner

## 2021-04-16 ENCOUNTER — Other Ambulatory Visit: Payer: Self-pay

## 2021-04-16 ENCOUNTER — Ambulatory Visit (HOSPITAL_COMMUNITY)
Admission: RE | Admit: 2021-04-16 | Discharge: 2021-04-16 | Disposition: A | Discharge status: Home / Self Care | Payer: Medicare Other | Source: Ambulatory Visit | Attending: Physician Assistant | Admitting: Physician Assistant

## 2021-04-16 VITALS — BP 128/62 | HR 47

## 2021-04-16 DIAGNOSIS — R030 Elevated blood-pressure reading, without diagnosis of hypertension: Secondary | ICD-10-CM | POA: Insufficient documentation

## 2021-04-16 DIAGNOSIS — I10 Essential (primary) hypertension: Secondary | ICD-10-CM

## 2021-04-16 DIAGNOSIS — Z79899 Other long term (current) drug therapy: Secondary | ICD-10-CM | POA: Diagnosis not present

## 2021-04-16 DIAGNOSIS — I4891 Unspecified atrial fibrillation: Secondary | ICD-10-CM

## 2021-04-16 NOTE — Progress Notes (Signed)
Pt in for nurse check with BP check and EKG.BP was elevated on last visit. Today stable  at 120/64. He is in S brady at 47 bpm. He is not symptomatic with this and has a tendency over the years to run a slow heart rate. Sean Bowman He is on amiodarone 200 mg daily.he feels well. Recheck in 3 months.

## 2021-05-04 DIAGNOSIS — H401131 Primary open-angle glaucoma, bilateral, mild stage: Secondary | ICD-10-CM | POA: Diagnosis not present

## 2021-05-04 DIAGNOSIS — Z961 Presence of intraocular lens: Secondary | ICD-10-CM | POA: Diagnosis not present

## 2021-05-04 DIAGNOSIS — H04123 Dry eye syndrome of bilateral lacrimal glands: Secondary | ICD-10-CM | POA: Diagnosis not present

## 2021-05-05 ENCOUNTER — Ambulatory Visit (HOSPITAL_COMMUNITY): Payer: Medicare Other | Admitting: Nurse Practitioner

## 2021-07-15 ENCOUNTER — Ambulatory Visit (HOSPITAL_COMMUNITY)
Admission: RE | Admit: 2021-07-15 | Discharge: 2021-07-15 | Disposition: A | Discharge status: Home / Self Care | Payer: Medicare Other | Source: Ambulatory Visit | Attending: Nurse Practitioner | Admitting: Nurse Practitioner

## 2021-07-15 ENCOUNTER — Encounter (HOSPITAL_COMMUNITY): Payer: Self-pay | Admitting: Nurse Practitioner

## 2021-07-15 VITALS — BP 166/56 | HR 41 | Ht 73.0 in | Wt 165.0 lb

## 2021-07-15 DIAGNOSIS — D6869 Other thrombophilia: Secondary | ICD-10-CM | POA: Diagnosis not present

## 2021-07-15 DIAGNOSIS — I4891 Unspecified atrial fibrillation: Secondary | ICD-10-CM | POA: Diagnosis not present

## 2021-07-15 DIAGNOSIS — R001 Bradycardia, unspecified: Secondary | ICD-10-CM

## 2021-07-15 DIAGNOSIS — Z79899 Other long term (current) drug therapy: Secondary | ICD-10-CM | POA: Diagnosis not present

## 2021-07-15 DIAGNOSIS — I4892 Unspecified atrial flutter: Secondary | ICD-10-CM | POA: Insufficient documentation

## 2021-07-15 DIAGNOSIS — I10 Essential (primary) hypertension: Secondary | ICD-10-CM | POA: Diagnosis not present

## 2021-07-15 DIAGNOSIS — I4819 Other persistent atrial fibrillation: Secondary | ICD-10-CM | POA: Diagnosis not present

## 2021-07-15 LAB — COMPREHENSIVE METABOLIC PANEL
ALT: 20 U/L (ref 0–44)
AST: 18 U/L (ref 15–41)
Albumin: 3.6 g/dL (ref 3.5–5.0)
Alkaline Phosphatase: 49 U/L (ref 38–126)
Anion gap: 3 — ABNORMAL LOW (ref 5–15)
BUN: 21 mg/dL (ref 8–23)
CO2: 27 mmol/L (ref 22–32)
Calcium: 9.1 mg/dL (ref 8.9–10.3)
Chloride: 109 mmol/L (ref 98–111)
Creatinine, Ser: 1.25 mg/dL — ABNORMAL HIGH (ref 0.61–1.24)
GFR, Estimated: 54 mL/min — ABNORMAL LOW (ref >60)
Glucose, Bld: 111 mg/dL — ABNORMAL HIGH (ref 70–99)
Potassium: 4.4 mmol/L (ref 3.5–5.1)
Sodium: 139 mmol/L (ref 135–145)
Total Bilirubin: 0.6 mg/dL (ref 0.3–1.2)
Total Protein: 6.3 g/dL — ABNORMAL LOW (ref 6.5–8.1)

## 2021-07-15 LAB — TSH: TSH: 1.442 u[IU]/mL (ref 0.350–4.500)

## 2021-07-15 NOTE — Progress Notes (Signed)
? ?Primary Care Physician: Burnard Bunting, MD ?Referring Physician:Dr. Allred ? ? ?Sean Bowman is a 88 y.o. male with a h/o persistent afib that was diagnosed in December of 2017. He was started on anticoagulation and weeks later had DCCV which was unsuccessful. At time of cardioversion Cardizem was doubled for better rate control and simvastatin was stopped due to drug drug interaction concerns. He was later started on rosuvastatin. ? ?Echo showed EF of 40-45%. It was decided that he would benefit from restoring SR with tikosyn with probable TMC. He was admitted for Tikosyn initiation.He returned to SR and qtc was stable. He was eventually taken off Cardizem and some ankle edema he had improved and he only takes lasix as needed. Left on low dose BB. BB was held at one point, and had return of afib. ? ?F/u in afib clinic, 03/16/17, for general  f/u of tikosyn. He is staying in [place]. He has had all over joint discomfort including the chest, shoulders and back. He saw a specialist and is thought to have some sort of arthritis and is on prednisone which does help with symptoms. No bleeding issues with eliquis. ? ?F/u in afib clinic 4/22,he is doing well staying in [place] with Tikosyn.  He has a HR in the 40's, which is chronic but is not symptomatic. 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 No bleeding issues with eliquis 5 mg bid. ? ?F/u in afib clinic 8/28. He feels well. He again has a HR in the upper 40's but is asymptomatic. He is taking eliquis on a regular basis. Being compliant with dofetilide as well. ? ?F/u in afib clinic, 12/14. He appears to be at his baseline. No afib to report just concerned re the slow HR, from  which he does not appear to be symptomatic. We have discussed in the past trying to stop the BB and today, he would like to pursue this. Reports some light nosebleeds.  ? ?F/u in afib clinic, 6/9. He feels well. He is staying in [place]. No complaints other than very mild dizziness with  position change. He is not currently  ?on any medication that could contribute to this.  ? ?F/u 12/12/18. He reports that he feels well. No issues with afib. Continues on Tikosyn. No issues with anticoagulation. ? ?F/i in afib clinic 03/15/19. He has not noted any afib. Continues on tikosyn and apixaban 5 mg bid for a CHA2DS2VASc of 3. No issues with bleeding. HR's in the 40's today that has been present in the past. He is not symptomatic nor on rate control.  ? ?F/u in afib clinic, 06/13/19.He remains in sinus brady which is  chronic and he tolerates well Has some mild LLE at end of the day that goes away by the next morning. No complaints today. Being compliant with tikosyn and eliquis. No afib to report.  ? ?F/u afib clinic, 09/12/19. No afib to report. Has a resting HR in the 40's but when walked down the hall, the HR picked up appropriately  to the mid 60's. He is not symptomatic with the bradycardia. The bradycardia is symptomatic and he is now on any AV nodal blocking drugs. He feels well. He will soon turn 89. No issues with DOAC, takes Tikosyn on a regular basis. He has both covid shots.   ? ?F/u in afib clinic, 12/17/19. Remains in sinus brady at 51 bpm and reports no afib awareness. He has c/o of dizziness from time to time  but is stating that is more noticeable lately. This is with sitting and standing. He is not on any AV nodal blocking drugs. His BP is elevated today, he does not monitor his HR or BP at home. I did walk him last time he was in the clinic and his HR did pick up appropriately with exercise.  Continues on eliquis, no bleeding issues, CHA2DS2VASc score of 3.  ? ?F/u in afib clinic 04/09/20. He saw Dr. Johney Frame in November and pt had a low risk stress test. Dr. Johney Frame also offered him a PPM, with his low HR's and  intermittent dizziness. Pt declined. He remains in SR with Tikosyn. EKg shows sinus brady at 50 bpm today. He has not noted much dizziness lately. He continues to  work. Qtc stable.  ? ?F/u  with afib clinic, 07/24/20. Ekg shows afib with rvr at 134 bpm.  He was unaware. He states that he came down suddenly with fatigue and head congestion 2 days ago. He denies fever, cough, shortness of breath with exertion. PO 97%.  He has been using OC decongestants.  CHA2DS2VASc score of 3. He continues on eliquis. He has not spoken to PCP re recent symptoms.  ? ?F/u  in afib clinic, 11/05/20. He did convert to SR with  BB added after last visit in May when he was asymptomatic with afib with RVR. He was sick at the time. After he converted , BB was again made prn, as he is slow in SR. Today EKG shows atrial flutter with RVR at 135 bpm. He is not really aware of being out of rhythm but feels he may have been off for the last several weeks. 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 Denies any recent sickness.he continues on tikosyn and eliquis.  ? ?F/u in afib clinic, 11/12/20, after being found back in afib earlier in week and coming back for EKG's for rhythm check. He initially converted when metoprolol 12.5 mg bid was added. He continues in afib with RVR. I am concerned to push BB as he has a HR in the 40's in the past with low dose BB. He has been asymptomatic with bradycardia in the past and the only issue he has noted recently with the RVR is that he feels anxious in his chest at times. At his advanced age, I am concerned that he could deteriorate with RVR. He was suppose to drive to a wedding in [place] today but I have advised against this as I am afraid he may become lightheaded behind the wheel. He agrees not to go. He still works running his own business. I discussed with Dr. Johney Frame and he feels that tikosyn should be stopped and amiodarone started. Pt is in agreement. Dr. Johney Frame  would prefer to defer PPM at this point for tachy/brady.  ? ?F/u in afib clinic,  11/28/20. He is back in afib clinic  being loaded on amiodarone for failing Tikosyn and afib with RVR. He is now on amiodarone for around 10 days. He appears to be tolerating afib well. Ekg shows afib at 109 bpm. We discussed I will plan to cardiovert in another 2-3 weeks. No missed anticoagulation. ? ?F/u in afib clinic, 12/10/20. He has now been on amiodarone for one month and will schedule for cardioversion. He is tolerating afib well. He is now on amiodarone 200  mg daily. He states no missed anticoagulation.  ? ?F/u in afib clinic 10/27 after successful cardioversion and remains in SR today. He has not noted a lot of improvement but has noted less swelling in his ankles. His energy stayed good when he was in afib with RVR. He is on amiodarone 200 mg daily.  ? ?F/u in afib clinic, 11/30, he remains in [place]. He feels well.Continues on amiodarone 200 mg daily.  ? ?Pt  being seen today ,04/08/21, for his PCP picking up an elevated HR on his visit last week. He did not note anything abnormal. His ekg today shows sinus brady at 45 bpm. He is not symptomatic with this. His BP is elevated today. He has just returned  from a cruise and has had tro bring in food as his wife hurt her leg and has not been able to cook.  ? ?F/u in afib clinic, 07/15/21. He is in sinus brady with PAC's. He states that he is not symptomatic.In the past, I have sent to Dr. Rayann Heman for consideration of a PPM but he felt he was not symptomatic enough to  warrant one. If I reduce amio, he may return to afib with RVR, hence tachy/brady.We discussed placing a monitor today.  ? ?Today, he denies symptoms of palpitations,  shortness of breath, orthopnea, PND  dizziness, presyncope, syncope, or neurologic sequela. The patient is tolerating medications without difficulties and is otherwise without complaint today.  ? ?Past Medical History:  ?Diagnosis Date  ? Arthralgia   ? Benign positional vertigo   ? HTN (hypertension)   ? Hyperlipidemia   ? Impaired glucose  tolerance   ? Persistent atrial fibrillation ([place])   ? Senile purpura ([facility])   ? ?Past Surgical History:  ?Procedure Laterality Date  ? CARDIOVERSION N/A 03/29/2016  ? Procedure: CARDIOVERSION;  Surgeon: Dani Gobble

## 2021-08-12 ENCOUNTER — Telehealth (HOSPITAL_COMMUNITY): Payer: Self-pay | Admitting: *Deleted

## 2021-08-12 NOTE — Telephone Encounter (Signed)
Patient heart monitor returned to [facility] but was not appropriately activated therefore no data was collected and will need to be reapplied. Notified pt of this and pt states he would call back when he is ready to wear another heart monitor.

## 2021-08-20 ENCOUNTER — Ambulatory Visit (HOSPITAL_COMMUNITY)
Admission: RE | Admit: 2021-08-20 | Discharge: 2021-08-20 | Disposition: A | Discharge status: Home / Self Care | Payer: Medicare Other | Source: Ambulatory Visit | Attending: Physician Assistant | Admitting: Physician Assistant

## 2021-08-20 ENCOUNTER — Ambulatory Visit (HOSPITAL_COMMUNITY)
Admission: RE | Admit: 2021-08-20 | Discharge: 2021-08-20 | Disposition: A | Discharge status: Home / Self Care | Payer: Medicare Other | Source: Ambulatory Visit | Attending: Nurse Practitioner | Admitting: Nurse Practitioner

## 2021-08-20 DIAGNOSIS — I495 Sick sinus syndrome: Secondary | ICD-10-CM | POA: Diagnosis not present

## 2021-08-21 ENCOUNTER — Other Ambulatory Visit (HOSPITAL_COMMUNITY): Payer: Medicare Other | Admitting: Physician Assistant

## 2021-09-10 ENCOUNTER — Encounter (HOSPITAL_COMMUNITY): Payer: Self-pay | Admitting: *Deleted

## 2021-09-16 DIAGNOSIS — I10 Essential (primary) hypertension: Secondary | ICD-10-CM | POA: Diagnosis not present

## 2021-09-16 DIAGNOSIS — R7989 Other specified abnormal findings of blood chemistry: Secondary | ICD-10-CM | POA: Diagnosis not present

## 2021-09-16 DIAGNOSIS — R82998 Other abnormal findings in urine: Secondary | ICD-10-CM | POA: Diagnosis not present

## 2021-09-16 DIAGNOSIS — Z125 Encounter for screening for malignant neoplasm of prostate: Secondary | ICD-10-CM | POA: Diagnosis not present

## 2021-09-16 DIAGNOSIS — E785 Hyperlipidemia, unspecified: Secondary | ICD-10-CM | POA: Diagnosis not present

## 2021-09-23 DIAGNOSIS — Z0000 Encounter for general adult medical examination without abnormal findings: Secondary | ICD-10-CM | POA: Diagnosis not present

## 2021-09-30 DIAGNOSIS — E785 Hyperlipidemia, unspecified: Secondary | ICD-10-CM | POA: Diagnosis not present

## 2021-09-30 DIAGNOSIS — Z0000 Encounter for general adult medical examination without abnormal findings: Secondary | ICD-10-CM | POA: Diagnosis not present

## 2021-09-30 DIAGNOSIS — I10 Essential (primary) hypertension: Secondary | ICD-10-CM | POA: Diagnosis not present

## 2021-09-30 DIAGNOSIS — I480 Paroxysmal atrial fibrillation: Secondary | ICD-10-CM | POA: Diagnosis not present

## 2021-10-16 DIAGNOSIS — H401131 Primary open-angle glaucoma, bilateral, mild stage: Secondary | ICD-10-CM | POA: Diagnosis not present

## 2021-10-16 DIAGNOSIS — H04123 Dry eye syndrome of bilateral lacrimal glands: Secondary | ICD-10-CM | POA: Diagnosis not present

## 2021-10-16 DIAGNOSIS — H18413 Arcus senilis, bilateral: Secondary | ICD-10-CM | POA: Diagnosis not present

## 2021-10-16 DIAGNOSIS — Z961 Presence of intraocular lens: Secondary | ICD-10-CM | POA: Diagnosis not present

## 2021-11-16 DIAGNOSIS — L84 Corns and callosities: Secondary | ICD-10-CM | POA: Diagnosis not present

## 2022-01-22 ENCOUNTER — Other Ambulatory Visit (HOSPITAL_COMMUNITY): Payer: Self-pay | Admitting: Nurse Practitioner

## 2022-02-22 ENCOUNTER — Ambulatory Visit (HOSPITAL_COMMUNITY): Payer: Medicare Other | Admitting: Nurse Practitioner

## 2022-02-25 ENCOUNTER — Ambulatory Visit (HOSPITAL_COMMUNITY)
Admission: RE | Admit: 2022-02-25 | Discharge: 2022-02-25 | Disposition: A | Discharge status: Home / Self Care | Payer: Medicare Other | Source: Ambulatory Visit | Attending: Nurse Practitioner | Admitting: Nurse Practitioner

## 2022-02-25 ENCOUNTER — Encounter (HOSPITAL_COMMUNITY): Payer: Self-pay | Admitting: Nurse Practitioner

## 2022-02-25 VITALS — BP 144/66 | HR 41 | Ht 73.0 in | Wt 164.2 lb

## 2022-02-25 DIAGNOSIS — I4891 Unspecified atrial fibrillation: Secondary | ICD-10-CM | POA: Diagnosis not present

## 2022-02-25 DIAGNOSIS — I10 Essential (primary) hypertension: Secondary | ICD-10-CM | POA: Insufficient documentation

## 2022-02-25 DIAGNOSIS — D6869 Other thrombophilia: Secondary | ICD-10-CM | POA: Diagnosis not present

## 2022-02-25 DIAGNOSIS — I498 Other specified cardiac arrhythmias: Secondary | ICD-10-CM | POA: Diagnosis not present

## 2022-02-25 DIAGNOSIS — Z7901 Long term (current) use of anticoagulants: Secondary | ICD-10-CM | POA: Insufficient documentation

## 2022-02-25 DIAGNOSIS — I4892 Unspecified atrial flutter: Secondary | ICD-10-CM | POA: Diagnosis not present

## 2022-02-25 DIAGNOSIS — I4819 Other persistent atrial fibrillation: Secondary | ICD-10-CM | POA: Diagnosis not present

## 2022-02-25 LAB — TSH: TSH: 1.322 u[IU]/mL (ref 0.350–4.500)

## 2022-02-25 LAB — CBC
HCT: 35 % — ABNORMAL LOW (ref 39.0–52.0)
Hemoglobin: 11.7 g/dL — ABNORMAL LOW (ref 13.0–17.0)
MCH: 34 pg (ref 26.0–34.0)
MCHC: 33.4 g/dL (ref 30.0–36.0)
MCV: 101.7 fL — ABNORMAL HIGH (ref 80.0–100.0)
Platelets: 191 10*3/uL (ref 150–400)
RBC: 3.44 MIL/uL — ABNORMAL LOW (ref 4.22–5.81)
RDW: 14.8 % (ref 11.5–15.5)
WBC: 5.7 10*3/uL (ref 4.0–10.5)
nRBC: 0 % (ref 0.0–0.2)

## 2022-02-25 LAB — COMPREHENSIVE METABOLIC PANEL
ALT: 20 U/L (ref 0–44)
AST: 20 U/L (ref 15–41)
Albumin: 3.6 g/dL (ref 3.5–5.0)
Alkaline Phosphatase: 46 U/L (ref 38–126)
Anion gap: 3 — ABNORMAL LOW (ref 5–15)
BUN: 20 mg/dL (ref 8–23)
CO2: 27 mmol/L (ref 22–32)
Calcium: 9.1 mg/dL (ref 8.9–10.3)
Chloride: 109 mmol/L (ref 98–111)
Creatinine, Ser: 1.32 mg/dL — ABNORMAL HIGH (ref 0.61–1.24)
GFR, Estimated: 51 mL/min — ABNORMAL LOW (ref >60)
Glucose, Bld: 116 mg/dL — ABNORMAL HIGH (ref 70–99)
Potassium: 4.8 mmol/L (ref 3.5–5.1)
Sodium: 139 mmol/L (ref 135–145)
Total Bilirubin: 0.7 mg/dL (ref 0.3–1.2)
Total Protein: 6.5 g/dL (ref 6.5–8.1)

## 2022-02-25 MED ORDER — AMIODARONE HCL 200 MG PO TABS: 100.0000 mg | ORAL_TABLET | Freq: Every day | ORAL | 2 refills | Status: DC

## 2022-02-25 NOTE — Patient Instructions (Signed)
Decrease Amiodarone to 100mg once a day (1/2 of the 200mg tablet) ?

## 2022-02-25 NOTE — Progress Notes (Signed)
Primary Care Physician: Burnard Bunting, MD Referring Physician:Dr. Rayann Heman   Sean Bowman is a 88 y.o. male with a h/o persistent afib that was diagnosed in December of 2017. He was started on anticoagulation and weeks later had DCCV which was unsuccessful. At time of cardioversion Cardizem was doubled for better rate control and simvastatin was stopped due to drug drug interaction concerns. He was later started on rosuvastatin.  Echo showed EF of 40-45%. It was decided that he would benefit from restoring SR with tikosyn with probable TMC. He was admitted for Tikosyn initiation.He returned to SR and qtc was stable. He was eventually taken off Cardizem and some ankle edema he had improved and he only takes lasix as needed. Left on low dose BB. BB was held at one point, and had return of afib.  F/u in afib clinic, 03/16/17, for general  f/u of tikosyn. He is staying in [place]. He has had all over joint discomfort including the chest, shoulders and back. He saw a specialist and is thought to have some sort of arthritis and is on prednisone which does help with symptoms. No bleeding issues with eliquis.  F/u in afib clinic 4/22,he is doing well staying in [place] with Tikosyn.  He has a HR in the 40's, which is chronic but is not symptomatic. 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 No bleeding issues with eliquis 5 mg bid.  F/u in afib clinic 8/28. He feels well. He again has a HR in the upper 40's but is asymptomatic. He is taking eliquis on a regular basis. Being compliant with dofetilide as well.  F/u in afib clinic, 12/14. He appears to be at his baseline. No afib to report just concerned re the slow HR, from  which he does not appear to be symptomatic. We have discussed in the past trying to stop the BB and today, he would like to pursue this. Reports some light nosebleeds.   F/u in afib clinic, 6/9. He feels well. He is staying in [place]. No complaints other than very mild dizziness with  position change. He is not currently  on any medication that could contribute to this.   F/u 12/12/18. He reports that he feels well. No issues with afib. Continues on Tikosyn. No issues with anticoagulation.  F/i in afib clinic 03/15/19. He has not noted any afib. Continues on tikosyn and apixaban 5 mg bid for a CHA2DS2VASc of 3. No issues with bleeding. HR's in the 40's today that has been present in the past. He is not symptomatic nor on rate control.   F/u in afib clinic, 06/13/19.He remains in sinus brady which is  chronic and he tolerates well Has some mild LLE at end of the day that goes away by the next morning. No complaints today. Being compliant with tikosyn and eliquis. No afib to report.   F/u afib clinic, 09/12/19. No afib to report. Has a resting HR in the 40's but when walked down the hall, the HR picked up appropriately  to the mid 60's. He is not symptomatic with the bradycardia. The bradycardia is symptomatic and he is now on any AV nodal blocking drugs. He feels well. He will soon turn 89. No issues with DOAC, takes Tikosyn on a regular basis. He has both covid shots.    F/u in afib clinic, 12/17/19. Remains in sinus brady at 51 bpm and reports no afib awareness. He has c/o of dizziness from time to time  but is stating that is more noticeable lately. This is with sitting and standing. He is not on any AV nodal blocking drugs. His BP is elevated today, he does not monitor his HR or BP at home. I did walk him last time he was in the clinic and his HR did pick up appropriately with exercise.  Continues on eliquis, no bleeding issues, CHA2DS2VASc score of 3.   F/u in afib clinic 04/09/20. He saw Dr. Rayann Heman in November and pt had a low risk stress test. Dr. Rayann Heman also offered him a PPM, with his low HR's and  intermittent dizziness. Pt declined. He remains in SR with Tikosyn. EKg shows sinus brady at 50 bpm today. He has not noted much dizziness lately. He continues to  work. Qtc stable.   F/u  with afib clinic, 07/24/20. Ekg shows afib with rvr at 134 bpm.  He was unaware. He states that he came down suddenly with fatigue and head congestion 2 days ago. He denies fever, cough, shortness of breath with exertion. PO 97%.  He has been using OC decongestants.  CHA2DS2VASc score of 3. He continues on eliquis. He has not spoken to PCP re recent symptoms.   F/u  in afib clinic, 11/05/20. He did convert to SR with  BB added after last visit in May when he was asymptomatic with afib with RVR. He was sick at the time. After he converted , BB was again made prn, as he is slow in SR. Today EKG shows atrial flutter with RVR at 135 bpm. He is not really aware of being out of rhythm but feels he may have been off for the last several weeks. 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 Denies any recent sickness.he continues on tikosyn and eliquis.   F/u in afib clinic, 11/12/20, after being found back in afib earlier in week and coming back for EKG's for rhythm check. He initially converted when metoprolol 12.5 mg bid was added. He continues in afib with RVR. I am concerned to push BB as he has a HR in the 40's in the past with low dose BB. He has been asymptomatic with bradycardia in the past and the only issue he has noted recently with the RVR is that he feels anxious in his chest at times. At his advanced age, I am concerned that he could deteriorate with RVR. He was suppose to drive to a wedding in [place] today but I have advised against this as I am afraid he may become lightheaded behind the wheel. He agrees not to go. He still works running his own business. I discussed with Dr. Rayann Heman and he feels that tikosyn should be stopped and amiodarone started. Pt is in agreement. Dr. Rayann Heman  would prefer to defer PPM at this point for tachy/brady.   F/u in afib clinic,  11/28/20. He is back in afib clinic  being loaded on amiodarone for failing Tikosyn and afib with RVR. He is now on amiodarone for around 10 days. He appears to be tolerating afib well. Ekg shows afib at 109 bpm. We discussed I will plan to cardiovert in another 2-3 weeks. No missed anticoagulation.  F/u in afib clinic, 12/10/20. He has now been on amiodarone for one month and will schedule for cardioversion. He is tolerating afib well. He is now on amiodarone 200  mg daily. He states no missed anticoagulation.   F/u in afib clinic 10/27 after successful cardioversion and remains in SR today. He has not noted a lot of improvement but has noted less swelling in his ankles. His energy stayed good when he was in afib with RVR. He is on amiodarone 200 mg daily.   F/u in afib clinic, 11/30, he remains in SR. He feels well.Continues on amiodarone 200 mg daily.   Pt  being seen today ,04/08/21, for his PCP picking up an elevated HR on his visit last week. He did not note anything abnormal. His ekg today shows sinus brady at 45 bpm. He is not symptomatic with this. His BP is elevated today. He has just returned  from a cruise and has had tro bring in food as his wife hurt her leg and has not been able to cook.   F/u in afib clinic, 07/15/21. He is in sinus brady with PAC's. He states that he is not symptomatic.In the past, I have sent to Dr. Johney Frame for consideration of a PPM but he felt he was not symptomatic enough to  warrant one. If I reduce amio, he may return to afib with RVR, hence tachy/brady.We discussed placing a monitor today.   F/u in Afib clinic, 02/25/22. He remains in Sinus brady in the 40's but asymptomatic. Labs drawn for amiodarone use. He feels well. No complaints today. He wore a monitor in June which showed SR with no triggered episodes, no unusual pauses or indication for a PPM.. Since he has been in SR with the brady, no recent issues with afib, I will now try to reduce amiodarone  to 100 mg  daily. Pt's wife died in 2023-01-07, complications from a bowel obstruction.No symptoms of afib.   Today, he denies symptoms of palpitations,  shortness of breath, orthopnea, PND  dizziness, presyncope, syncope, or neurologic sequela. The patient is tolerating medications without difficulties and is otherwise without complaint today.   Past Medical History:  Diagnosis Date   Arthralgia    Benign positional vertigo    HTN (hypertension)    Hyperlipidemia    Impaired glucose tolerance    Persistent atrial fibrillation (HCC)    Senile purpura (HCC)    Past Surgical History:  Procedure Laterality Date   CARDIOVERSION N/A 03/29/2016   Procedure: CARDIOVERSION;  Surgeon: Thurmon Fair, MD;  Location: [facility];  Service: Cardiovascular;  Laterality: N/A;   CARDIOVERSION N/A 12/22/2020   Procedure: CARDIOVERSION;  Surgeon: Thomasene Ripple, DO;  Location: [facility];  Service: Cardiovascular;  Laterality: N/A;   CATARACT EXTRACTION W/ INTRAOCULAR LENS  IMPLANT, BILATERAL Bilateral 2017   ORIF METACARPAL FRACTURE Left ~ 2016   S/P fall   TONSILLECTOMY      Current Outpatient Medications  Medication Sig Dispense Refill   amiodarone (PACERONE) 200 MG tablet Take 1 tablet (200 mg total) by mouth daily. Appt needed for refill 90 tablet 0   apixaban (ELIQUIS) 5 MG TABS tablet Take 1 tablet (5 mg total) by mouth 2 (two) times daily. 180 tablet 3   Ascorbic Acid (VITAMIN C) 1000 MG tablet Take 1,000 mg by mouth daily.     carboxymethylcellulose (REFRESH PLUS) 0.5 % SOLN Place 1 drop into both eyes as needed.     cholecalciferol (VITAMIN D3) 25 MCG (1000 UNIT) tablet Take 1,000 Units by mouth daily.     latanoprost (XALATAN) 0.005 % ophthalmic solution 1 drop as needed.     rosuvastatin (CRESTOR) 20 MG tablet  Take 20 mg by mouth daily.  5   No current facility-administered medications for this encounter.    No Known Allergies  Social History   Socioeconomic History   Marital status: Married     Spouse name: Not on file   Number of children: Not on file   Years of education: Not on file   Highest education level: Not on file  Occupational History   Not on file  Tobacco Use   Smoking status: Never   Smokeless tobacco: Never  Substance and Sexual Activity   Alcohol use: Yes    Alcohol/week: 3.0 standard drinks of alcohol    Types: 2 Shots of liquor, 1 Cans of beer per week   Drug use: No   Sexual activity: Not on file  Other Topics Concern   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   Social Determinants of Health   Financial Resource Strain: Not on file  Food Insecurity: Not on file  Transportation Needs: Not on file  Physical Activity: Not on file  Stress: Not on file  Social Connections: Not on file  Intimate Partner Violence: Not on file    Family History  Problem Relation Age of Onset   Stroke Mother    Alzheimer's disease Mother    Alzheimer's disease Father     ROS- All systems are reviewed and negative except as per the HPI above  Physical Exam: Vitals:   02/25/22 0831  Height: 6\' 1"  (1.854 m)    Wt Readings from Last 3 Encounters:  07/15/21 74.8 kg  04/08/21 75 kg  02/04/21 75.3 kg    Labs: Lab Results  Component Value Date   NA 139 07/15/2021   K 4.4 07/15/2021   CL 109 07/15/2021   CO2 27 07/15/2021   GLUCOSE 111 (H) 07/15/2021   BUN 21 07/15/2021   CREATININE 1.25 (H) 07/15/2021   CALCIUM 9.1 07/15/2021   MG 2.2 11/05/2020   No results found for: "INR" No results found for: "CHOL", "HDL", "LDLCALC", "TRIG"   GEN- The patient is well appearing, alert and oriented x 3 today.   Head- normocephalic, atraumatic Eyes-  Sclera clear, conjunctiva pink Ears- hearing intact Oropharynx- clear Neck- supple, no JVP Lymph- no cervical lymphadenopathy Lungs- Clear to ausculation bilaterally, normal work of breathing.  No tenderness of chest wall on palpation Heart-irregular  slow, rate and rhythm, no murmurs, rubs or gallops, PMI not laterally displaced GI- soft, NT, ND, + BS Extremities- no clubbing, cyanosis, or  Mild edema around bilateral ankle area MS- no significant deformity or atrophy Skin- no rash or lesion Psych- euthymic mood, full affect Neuro- strength and sensation are intact  EKG-  Vent. rate 41 BPM PR interval 212 ms QRS duration 118 ms QT/QTcB 504/415 ms P-R-T axes 86 45 68 Marked sinus bradycardia with sinus arrhythmia with 1st degree A-V block Non-specific intra-ventricular conduction delay Abnormal ECG When compared with ECG of 15-Jul-2021 09:11, PREVIOUS ECG IS PRESENT   Assessment and Plan: 1. Afib/flutter  Failed tikosyn, it was stopped and then pt loaded  on amiodarone Successful cardioversion 10/17 and remains in SR    In a slow sinus brady, asymptomatic, prior moniotr did not show any need for PPM Decrease amiodarone to 100 mg daily  Continue apixaban 5 mg bid for chadsvasc score of 3  Cmet/tsh/cbc  2.  HTN Stable  for pt  Watch salt intake   Recheck in 3 weeks for EKG with reduction of amiodarone.      Elvina Sidle [facility] [address] [place], [place] [address] [phone]

## 2022-03-10 DIAGNOSIS — K080 Exfoliation of teeth due to systemic causes: Secondary | ICD-10-CM | POA: Diagnosis not present

## 2022-03-12 ENCOUNTER — Telehealth (HOSPITAL_COMMUNITY): Payer: Self-pay

## 2022-03-12 NOTE — Telephone Encounter (Signed)
Patients daughter called in regarding her father. He has been dealing with a cold for the past few days. She wanted to know what he could take. Advised her to give him Tylenol if needed. He can also take Mucinex DM and Robitussin is okay to take as well. Communicated with patient's daughter and she verbalized understanding.

## 2022-03-18 ENCOUNTER — Encounter (HOSPITAL_COMMUNITY): Payer: Self-pay

## 2022-03-18 ENCOUNTER — Encounter (HOSPITAL_COMMUNITY): Payer: Medicare Other | Admitting: Nurse Practitioner

## 2022-03-25 DIAGNOSIS — K080 Exfoliation of teeth due to systemic causes: Secondary | ICD-10-CM | POA: Diagnosis not present

## 2022-03-31 ENCOUNTER — Encounter (HOSPITAL_COMMUNITY): Payer: Medicare Other | Admitting: Nurse Practitioner

## 2022-03-31 ENCOUNTER — Encounter (HOSPITAL_COMMUNITY): Payer: Self-pay

## 2022-03-31 DIAGNOSIS — I480 Paroxysmal atrial fibrillation: Secondary | ICD-10-CM | POA: Diagnosis not present

## 2022-03-31 DIAGNOSIS — Z23 Encounter for immunization: Secondary | ICD-10-CM | POA: Diagnosis not present

## 2022-04-15 ENCOUNTER — Encounter (HOSPITAL_COMMUNITY): Payer: Self-pay | Admitting: *Deleted

## 2022-04-28 ENCOUNTER — Other Ambulatory Visit (HOSPITAL_COMMUNITY): Payer: Self-pay | Admitting: Nurse Practitioner

## 2022-05-05 DIAGNOSIS — L84 Corns and callosities: Secondary | ICD-10-CM | POA: Diagnosis not present

## 2022-05-11 ENCOUNTER — Ambulatory Visit (HOSPITAL_COMMUNITY)
Admission: RE | Admit: 2022-05-11 | Discharge: 2022-05-11 | Disposition: A | Discharge status: Home / Self Care | Payer: Medicare Other | Source: Ambulatory Visit | Attending: Nurse Practitioner | Admitting: Nurse Practitioner

## 2022-05-11 VITALS — BP 130/60 | HR 52 | Ht 73.0 in | Wt 161.6 lb

## 2022-05-11 DIAGNOSIS — Z7901 Long term (current) use of anticoagulants: Secondary | ICD-10-CM | POA: Diagnosis not present

## 2022-05-11 DIAGNOSIS — D6869 Other thrombophilia: Secondary | ICD-10-CM | POA: Diagnosis not present

## 2022-05-11 DIAGNOSIS — I10 Essential (primary) hypertension: Secondary | ICD-10-CM | POA: Insufficient documentation

## 2022-05-11 DIAGNOSIS — I4819 Other persistent atrial fibrillation: Secondary | ICD-10-CM | POA: Diagnosis not present

## 2022-05-11 LAB — COMPREHENSIVE METABOLIC PANEL
ALT: 20 U/L (ref 0–44)
AST: 21 U/L (ref 15–41)
Albumin: 3.2 g/dL — ABNORMAL LOW (ref 3.5–5.0)
Alkaline Phosphatase: 72 U/L (ref 38–126)
Anion gap: 5 (ref 5–15)
BUN: 22 mg/dL (ref 8–23)
CO2: 27 mmol/L (ref 22–32)
Calcium: 8.8 mg/dL — ABNORMAL LOW (ref 8.9–10.3)
Chloride: 107 mmol/L (ref 98–111)
Creatinine, Ser: 1.16 mg/dL (ref 0.61–1.24)
GFR, Estimated: 59 mL/min — ABNORMAL LOW (ref >60)
Glucose, Bld: 100 mg/dL — ABNORMAL HIGH (ref 70–99)
Potassium: 4.1 mmol/L (ref 3.5–5.1)
Sodium: 139 mmol/L (ref 135–145)
Total Bilirubin: 0.6 mg/dL (ref 0.3–1.2)
Total Protein: 6.3 g/dL — ABNORMAL LOW (ref 6.5–8.1)

## 2022-05-11 LAB — TSH: TSH: 1.133 u[IU]/mL (ref 0.350–4.500)

## 2022-05-11 MED ORDER — AMIODARONE HCL 200 MG PO TABS: ORAL_TABLET | ORAL | Status: DC

## 2022-05-11 NOTE — Progress Notes (Signed)
Primary Care Physician: Burnard Bunting, MD Referring Physician:Dr. Rayann Heman   Sean Bowman is a 89 y.o. male with a h/o persistent afib that was diagnosed in December of 2017. He was started on anticoagulation and weeks later had DCCV which was unsuccessful. At time of cardioversion Cardizem was doubled for better rate control and simvastatin was stopped due to drug drug interaction concerns. He was later started on rosuvastatin.  Echo showed EF of 40-45%. It was decided that he would benefit from restoring SR with tikosyn with probable TMC. He was admitted for Tikosyn initiation.He returned to SR and qtc was stable. He was eventually taken off Cardizem and some ankle edema he had improved and he only takes lasix as needed. Left on low dose BB. BB was held at one point, and had return of afib.  F/u in afib clinic, 03/16/17, for general  f/u of tikosyn. He is staying in [place]. He has had all over joint discomfort including the chest, shoulders and back. He saw a specialist and is thought to have some sort of arthritis and is on prednisone which does help with symptoms. No bleeding issues with eliquis.  F/u in afib clinic 4/22,he is doing well staying in [place] with Tikosyn.  He has a HR in the 40's, which is chronic but is not symptomatic. 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 No bleeding issues with eliquis 5 mg bid.  F/u in afib clinic 8/28. He feels well. He again has a HR in the upper 40's but is asymptomatic. He is taking eliquis on a regular basis. Being compliant with dofetilide as well.  F/u in afib clinic, 12/14. He appears to be at his baseline. No afib to report just concerned re the slow HR, from  which he does not appear to be symptomatic. We have discussed in the past trying to stop the BB and today, he would like to pursue this. Reports some light nosebleeds.   F/u in afib clinic, 6/9. He feels well. He is staying in [place]. No complaints other than very mild dizziness with  position change. He is not currently  on any medication that could contribute to this.   F/u 12/12/18. He reports that he feels well. No issues with afib. Continues on Tikosyn. No issues with anticoagulation.  F/i in afib clinic 03/15/19. He has not noted any afib. Continues on tikosyn and apixaban 5 mg bid for a CHA2DS2VASc of 3. No issues with bleeding. HR's in the 40's today that has been present in the past. He is not symptomatic nor on rate control.   F/u in afib clinic, 06/13/19.He remains in sinus brady which is  chronic and he tolerates well Has some mild LLE at end of the day that goes away by the next morning. No complaints today. Being compliant with tikosyn and eliquis. No afib to report.   F/u afib clinic, 09/12/19. No afib to report. Has a resting HR in the 40's but when walked down the hall, the HR picked up appropriately  to the mid 60's. He is not symptomatic with the bradycardia. The bradycardia is symptomatic and he is now on any AV nodal blocking drugs. He feels well. He will soon turn 89. No issues with DOAC, takes Tikosyn on a regular basis. He has both covid shots.    F/u in afib clinic, 12/17/19. Remains in sinus brady at 51 bpm and reports no afib awareness. He has c/o of dizziness from time to time  but is stating that is more noticeable lately. This is with sitting and standing. He is not on any AV nodal blocking drugs. His BP is elevated today, he does not monitor his HR or BP at home. I did walk him last time he was in the clinic and his HR did pick up appropriately with exercise.  Continues on eliquis, no bleeding issues, CHA2DS2VASc score of 3.   F/u in afib clinic 04/09/20. He saw Dr. Rayann Heman in November and pt had a low risk stress test. Dr. Rayann Heman also offered him a PPM, with his low HR's and  intermittent dizziness. Pt declined. He remains in SR with Tikosyn. EKg shows sinus brady at 50 bpm today. He has not noted much dizziness lately. He continues to  work. Qtc stable.   F/u  with afib clinic, 07/24/20. Ekg shows afib with rvr at 134 bpm.  He was unaware. He states that he came down suddenly with fatigue and head congestion 2 days ago. He denies fever, cough, shortness of breath with exertion. PO 97%.  He has been using OC decongestants.  CHA2DS2VASc score of 3. He continues on eliquis. He has not spoken to PCP re recent symptoms.   F/u  in afib clinic, 11/05/20. He did convert to SR with  BB added after last visit in May when he was asymptomatic with afib with RVR. He was sick at the time. After he converted , BB was again made prn, as he is slow in SR. Today EKG shows atrial flutter with RVR at 135 bpm. He is not really aware of being out of rhythm but feels he may have been off for the last several weeks. 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 Denies any recent sickness.he continues on tikosyn and eliquis.   F/u in afib clinic, 11/12/20, after being found back in afib earlier in week and coming back for EKG's for rhythm check. He initially converted when metoprolol 12.5 mg bid was added. He continues in afib with RVR. I am concerned to push BB as he has a HR in the 40's in the past with low dose BB. He has been asymptomatic with bradycardia in the past and the only issue he has noted recently with the RVR is that he feels anxious in his chest at times. At his advanced age, I am concerned that he could deteriorate with RVR. He was suppose to drive to a wedding in [place] today but I have advised against this as I am afraid he may become lightheaded behind the wheel. He agrees not to go. He still works running his own business. I discussed with Dr. Rayann Heman and he feels that tikosyn should be stopped and amiodarone started. Pt is in agreement. Dr. Rayann Heman  would prefer to defer PPM at this point for tachy/brady.   F/u in afib clinic,  11/28/20. He is back in afib clinic  being loaded on amiodarone for failing Tikosyn and afib with RVR. He is now on amiodarone for around 10 days. He appears to be tolerating afib well. Ekg shows afib at 109 bpm. We discussed I will plan to cardiovert in another 2-3 weeks. No missed anticoagulation.  F/u in afib clinic, 12/10/20. He has now been on amiodarone for one month and will schedule for cardioversion. He is tolerating afib well. He is now on amiodarone 200  mg daily. He states no missed anticoagulation.   F/u in afib clinic 10/27 after successful cardioversion and remains in SR today. He has not noted a lot of improvement but has noted less swelling in his ankles. His energy stayed good when he was in afib with RVR. He is on amiodarone 200 mg daily.   F/u in afib clinic, 11/30, he remains in [place]. He feels well.Continues on amiodarone 200 mg daily.   Pt  being seen today ,04/08/21, for his PCP picking up an elevated HR on his visit last week. He did not note anything abnormal. His ekg today shows sinus brady at 45 bpm. He is not symptomatic with this. His BP is elevated today. He has just returned  from a cruise and has had tro bring in food as his wife hurt her leg and has not been able to cook.   F/u in afib clinic, 07/15/21. He is in sinus brady with PAC's. He states that he is not symptomatic.In the past, I have sent to Dr. Rayann Heman for consideration of a PPM but he felt he was not symptomatic enough to  warrant one. If I reduce amio, he may return to afib with RVR, hence tachy/brady.We discussed placing a monitor today.   F/u in Afib clinic, 02/25/22. He remains in Sinus brady in the 40's but asymptomatic. Labs drawn for amiodarone use. He feels well. No complaints today. He wore a monitor in June which showed SR with no triggered episodes, no unusual pauses or indication for a PPM.. Since he has been in SR with the brady, no recent issues with afib, I will now try to reduce amiodarone  to 100 mg  daily. Pt's wife died in 01-12-2023, complications from a bowel obstruction.No symptoms of afib.   F/u  in afib clinic. He is on amiodarone 100 mg daily and HR I s52 bpm. He is not symptomatic with this. [place]. No complaints voiced.    Today, he denies symptoms of palpitations,  shortness of breath, orthopnea, PND  dizziness, presyncope, syncope, or neurologic sequela. The patient is tolerating medications without difficulties and is otherwise without complaint today.   Past Medical History:  Diagnosis Date   Arthralgia    Benign positional vertigo    HTN (hypertension)    Hyperlipidemia    Impaired glucose tolerance    Persistent atrial fibrillation (HCC)    Senile purpura (HCC)    Past Surgical History:  Procedure Laterality Date   CARDIOVERSION N/A 03/29/2016   Procedure: CARDIOVERSION;  Surgeon: Sanda Klein, MD;  Location: [facility];  Service: Cardiovascular;  Laterality: N/A;   CARDIOVERSION N/A 12/22/2020   Procedure: CARDIOVERSION;  Surgeon: Berniece Salines, DO;  Location: [facility];  Service: Cardiovascular;  Laterality: N/A;   CATARACT EXTRACTION W/ INTRAOCULAR LENS  IMPLANT, BILATERAL Bilateral 2017   ORIF METACARPAL FRACTURE Left ~ 2016   S/P fall   TONSILLECTOMY      Current Outpatient Medications  Medication Sig Dispense Refill   apixaban (ELIQUIS) 5 MG TABS tablet Take 1 tablet (5 mg total) by mouth 2 (two) times daily. 180 tablet 3   Ascorbic Acid (VITAMIN C) 1000 MG tablet Take 1,000 mg by mouth daily.     carboxymethylcellulose (REFRESH PLUS) 0.5 % SOLN Place 1 drop into both eyes as needed.     cholecalciferol (VITAMIN D3) 25 MCG (1000 UNIT) tablet Take 1,000 Units by mouth daily.     latanoprost (XALATAN) 0.005 % ophthalmic solution 1 drop as  needed.     rosuvastatin (CRESTOR) 20 MG tablet Take 20 mg by mouth daily.  5   amiodarone (PACERONE) 200 MG tablet Takes 1/2 tablet by mouth daily     No current facility-administered medications for this  encounter.    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   Social Determinants of Health   Financial Resource Strain: Not on file  Food Insecurity: Not on file  Transportation Needs: Not on file  Physical Activity: Not on file  Stress: Not on file  Social Connections: Not on file  Intimate Partner Violence: Not on file    Family History  Problem Relation Age of Onset   Stroke Mother    Alzheimer's disease Mother    Alzheimer's disease Father     ROS- All systems are reviewed and negative except as per the HPI above  Physical Exam: Vitals:   05/11/22 0921  BP: 130/60  Pulse: (!) 52  Weight: 73.3 kg  Height: '6\' 1"'$  (1.854 m)    Wt Readings from Last 3 Encounters:  05/11/22 73.3 kg  02/25/22 74.5 kg  07/15/21 74.8 kg    Labs: Lab Results  Component Value Date   NA 139 02/25/2022   K 4.8 02/25/2022   CL 109 02/25/2022   CO2 27 02/25/2022   GLUCOSE 116 (H) 02/25/2022   BUN 20 02/25/2022   CREATININE 1.32 (H) 02/25/2022   CALCIUM 9.1 02/25/2022   MG 2.2 11/05/2020   No results found for: "INR" No results found for: "CHOL", "HDL", "[place]", "TRIG"   GEN- The patient is well appearing, alert and oriented x 3 today.   Head- normocephalic, atraumatic Eyes-  Sclera clear, conjunctiva pink Ears-  hearing intact Oropharynx- clear Neck- supple, no JVP Lymph- no cervical lymphadenopathy Lungs- Clear to ausculation bilaterally, normal work of breathing. No tenderness of chest wall on palpation Heart-irregular  slow, rate and rhythm, no murmurs, rubs or gallops, PMI not laterally displaced GI- soft, NT, ND, + BS Extremities- no clubbing, cyanosis, or  Mild edema around bilateral ankle area MS- no significant deformity or atrophy Skin- no rash or lesion Psych- euthymic mood, full affect Neuro- strength and sensation are intact  EKG-  Vent. rate 52 BPM PR interval 190 ms QRS duration 100 ms QT/QTcB 462/429 ms P-R-T axes 87 33 56 Sinus bradycardia Otherwise normal ECG When compared with ECG of 25-Feb-2022 08:42, PREVIOUS ECG IS PRESENT   Assessment and Plan: 1. Afib/flutter  Failed tikosyn, it was stopped and then pt loaded  on amiodarone Successful cardioversion 10/17 and remains in SR    In a slow sinus brady, asymptomatic, prior moniotr did not show any need for PPM  Continue amiodarone  100 mg daily  Continue apixaban 5 mg bid for chadsvasc score of 3  Cmet/tsh/ pending   2.  HTN Stable    Recheck in 6 months      Geroge Baseman. Ekam Bonebrake, [facility] [address] [place], [place] [address] [phone]

## 2022-06-30 DIAGNOSIS — M25552 Pain in left hip: Secondary | ICD-10-CM | POA: Diagnosis not present

## 2022-06-30 DIAGNOSIS — W19XXXA Unspecified fall, initial encounter: Secondary | ICD-10-CM | POA: Diagnosis not present

## 2022-06-30 DIAGNOSIS — S61412A Laceration without foreign body of left hand, initial encounter: Secondary | ICD-10-CM | POA: Diagnosis not present

## 2022-07-14 DIAGNOSIS — L03116 Cellulitis of left lower limb: Secondary | ICD-10-CM | POA: Diagnosis not present

## 2022-07-14 DIAGNOSIS — L84 Corns and callosities: Secondary | ICD-10-CM | POA: Diagnosis not present

## 2022-07-30 DIAGNOSIS — H401131 Primary open-angle glaucoma, bilateral, mild stage: Secondary | ICD-10-CM | POA: Diagnosis not present

## 2022-07-30 DIAGNOSIS — Z961 Presence of intraocular lens: Secondary | ICD-10-CM | POA: Diagnosis not present

## 2022-07-30 DIAGNOSIS — H18413 Arcus senilis, bilateral: Secondary | ICD-10-CM | POA: Diagnosis not present

## 2022-07-30 DIAGNOSIS — H04123 Dry eye syndrome of bilateral lacrimal glands: Secondary | ICD-10-CM | POA: Diagnosis not present

## 2022-09-15 ENCOUNTER — Telehealth (HOSPITAL_COMMUNITY): Payer: Self-pay | Admitting: *Deleted

## 2022-09-15 MED ORDER — FUROSEMIDE 20 MG PO TABS: 20.0000 mg | ORAL_TABLET | Freq: Every day | ORAL | 3 refills | Status: DC

## 2022-09-15 NOTE — Telephone Encounter (Signed)
Patient daughter called in stating pt is having lower extremity swelling no shortness of breath or weight gain noted. Discussed with Jorja Loa PA will start lasix 20mg  daily for the next 5 days then call back with update of response. Daughter verbalized agreement.

## 2022-09-22 NOTE — Telephone Encounter (Signed)
Per daughter pts swelling has subsided. WIll go to using lasix PRN swelling. Follow up as scheduled.

## 2022-09-27 DIAGNOSIS — M79672 Pain in left foot: Secondary | ICD-10-CM | POA: Diagnosis not present

## 2022-09-27 DIAGNOSIS — L851 Acquired keratosis [keratoderma] palmaris et plantaris: Secondary | ICD-10-CM | POA: Diagnosis not present

## 2022-09-30 DIAGNOSIS — K080 Exfoliation of teeth due to systemic causes: Secondary | ICD-10-CM | POA: Diagnosis not present

## 2022-10-04 ENCOUNTER — Ambulatory Visit (HOSPITAL_COMMUNITY)
Admission: RE | Admit: 2022-10-04 | Discharge: 2022-10-04 | Disposition: A | Discharge status: Home / Self Care | Payer: Medicare Other | Source: Ambulatory Visit | Attending: Internal Medicine | Admitting: Internal Medicine

## 2022-10-04 ENCOUNTER — Encounter (HOSPITAL_COMMUNITY): Payer: Self-pay | Admitting: Internal Medicine

## 2022-10-04 VITALS — BP 122/68 | HR 44 | Ht 73.0 in | Wt 164.6 lb

## 2022-10-04 DIAGNOSIS — I10 Essential (primary) hypertension: Secondary | ICD-10-CM | POA: Insufficient documentation

## 2022-10-04 DIAGNOSIS — Z5181 Encounter for therapeutic drug level monitoring: Secondary | ICD-10-CM | POA: Diagnosis not present

## 2022-10-04 DIAGNOSIS — D6869 Other thrombophilia: Secondary | ICD-10-CM | POA: Diagnosis not present

## 2022-10-04 DIAGNOSIS — I4819 Other persistent atrial fibrillation: Secondary | ICD-10-CM | POA: Diagnosis not present

## 2022-10-04 DIAGNOSIS — R001 Bradycardia, unspecified: Secondary | ICD-10-CM | POA: Insufficient documentation

## 2022-10-04 DIAGNOSIS — Z7901 Long term (current) use of anticoagulants: Secondary | ICD-10-CM | POA: Insufficient documentation

## 2022-10-04 DIAGNOSIS — I4892 Unspecified atrial flutter: Secondary | ICD-10-CM | POA: Insufficient documentation

## 2022-10-04 DIAGNOSIS — Z79899 Other long term (current) drug therapy: Secondary | ICD-10-CM

## 2022-10-04 LAB — COMPREHENSIVE METABOLIC PANEL
ALT: 16 U/L (ref 0–44)
AST: 18 U/L (ref 15–41)
Albumin: 3.6 g/dL (ref 3.5–5.0)
Alkaline Phosphatase: 48 U/L (ref 38–126)
Anion gap: 9 (ref 5–15)
BUN: 19 mg/dL (ref 8–23)
CO2: 26 mmol/L (ref 22–32)
Calcium: 8.6 mg/dL — ABNORMAL LOW (ref 8.9–10.3)
Chloride: 105 mmol/L (ref 98–111)
Creatinine, Ser: 1.22 mg/dL (ref 0.61–1.24)
GFR, Estimated: 56 mL/min — ABNORMAL LOW (ref >60)
Glucose, Bld: 151 mg/dL — ABNORMAL HIGH (ref 70–99)
Potassium: 3.7 mmol/L (ref 3.5–5.1)
Sodium: 140 mmol/L (ref 135–145)
Total Bilirubin: 0.7 mg/dL (ref 0.3–1.2)
Total Protein: 6.5 g/dL (ref 6.5–8.1)

## 2022-10-04 LAB — TSH: TSH: 1.154 u[IU]/mL (ref 0.350–4.500)

## 2022-10-04 NOTE — Progress Notes (Addendum)
Primary Care Physician: Geoffry Paradise, MD Referring Physician:Dr. Johney Frame   Sean Bowman is a 89 y.o. male with a h/o persistent afib that was diagnosed in December of 2017. He was started on anticoagulation and weeks later had DCCV which was unsuccessful. At time of cardioversion Cardizem was doubled for better rate control and simvastatin was stopped due to drug drug interaction concerns. He was later started on rosuvastatin.  Echo showed EF of 40-45%. It was decided that he would benefit from restoring SR with tikosyn with probable TMC. He was admitted for Tikosyn initiation.He returned to SR and qtc was stable. He was eventually taken off Cardizem and some ankle edema he had improved and he only takes lasix as needed. Left on low dose BB. BB was held at one point, and had return of afib.  F/u in afib clinic, 03/16/17, for general  f/u of tikosyn. He is staying in SR. He has had all over joint discomfort including the chest, shoulders and back. He saw a specialist and is thought to have some sort of arthritis and is on prednisone which does help with symptoms. No bleeding issues with eliquis.  F/u in afib clinic 4/22,he is doing well staying in SR with Tikosyn.  He has a HR in the 40's, which is chronic but is not symptomatic. 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 No bleeding issues with eliquis 5 mg bid.  F/u in afib clinic 8/28. He feels well. He again has a HR in the upper 40's but is asymptomatic. He is taking eliquis on a regular basis. Being compliant with dofetilide as well.  F/u in afib clinic, 12/14. He appears to be at his baseline. No afib to report just concerned re the slow HR, from  which he does not appear to be symptomatic. We have discussed in the past trying to stop the BB and today, he would like to pursue this. Reports some light nosebleeds.   F/u in afib clinic, 6/9. He feels well. He is staying in SR. No complaints other than very mild dizziness with  position change. He is not currently  on any medication that could contribute to this.   F/u 12/12/18. He reports that he feels well. No issues with afib. Continues on Tikosyn. No issues with anticoagulation.  F/i in afib clinic 03/15/19. He has not noted any afib. Continues on tikosyn and apixaban 5 mg bid for a CHA2DS2VASc of 3. No issues with bleeding. HR's in the 40's today that has been present in the past. He is not symptomatic nor on rate control.   F/u in afib clinic, 06/13/19.He remains in sinus brady which is  chronic and he tolerates well Has some mild LLE at end of the day that goes away by the next morning. No complaints today. Being compliant with tikosyn and eliquis. No afib to report.   F/u afib clinic, 09/12/19. No afib to report. Has a resting HR in the 40's but when walked down the hall, the HR picked up appropriately  to the mid 60's. He is not symptomatic with the bradycardia. The bradycardia is symptomatic and he is now on any AV nodal blocking drugs. He feels well. He will soon turn 89. No issues with DOAC, takes Tikosyn on a regular basis. He has both covid shots.    F/u in afib clinic, 12/17/19. Remains in sinus brady at 51 bpm and reports no afib awareness. He has c/o of dizziness from time to time  but is stating that is more noticeable lately. This is with sitting and standing. He is not on any AV nodal blocking drugs. His BP is elevated today, he does not monitor his HR or BP at home. I did walk him last time he was in the clinic and his HR did pick up appropriately with exercise.  Continues on eliquis, no bleeding issues, CHA2DS2VASc score of 3.   F/u in afib clinic 04/09/20. He saw Dr. Johney Frame in November and pt had a low risk stress test. Dr. Johney Frame also offered him a PPM, with his low HR's and  intermittent dizziness. Pt declined. He remains in SR with Tikosyn. EKg shows sinus brady at 50 bpm today. He has not noted much dizziness lately. He continues to  work. Qtc stable.   F/u  with afib clinic, 07/24/20. Ekg shows afib with rvr at 134 bpm.  He was unaware. He states that he came down suddenly with fatigue and head congestion 2 days ago. He denies fever, cough, shortness of breath with exertion. PO 97%.  He has been using OC decongestants.  CHA2DS2VASc score of 3. He continues on eliquis. He has not spoken to PCP re recent symptoms.   F/u  in afib clinic, 11/05/20. He did convert to SR with  BB added after last visit in May when he was asymptomatic with afib with RVR. He was sick at the time. After he converted , BB was again made prn, as he is slow in SR. Today EKG shows atrial flutter with RVR at 135 bpm. He is not really aware of being out of rhythm but feels he may have been off for the last several weeks. 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 Denies any recent sickness.he continues on tikosyn and eliquis.   F/u in afib clinic, 11/12/20, after being found back in afib earlier in week and coming back for EKG's for rhythm check. He initially converted when metoprolol 12.5 mg bid was added. He continues in afib with RVR. I am concerned to push BB as he has a HR in the 40's in the past with low dose BB. He has been asymptomatic with bradycardia in the past and the only issue he has noted recently with the RVR is that he feels anxious in his chest at times. At his advanced age, I am concerned that he could deteriorate with RVR. He was suppose to drive to a wedding in [place] today but I have advised against this as I am afraid he may become lightheaded behind the wheel. He agrees not to go. He still works running his own business. I discussed with Dr. Johney Frame and he feels that tikosyn should be stopped and amiodarone started. Pt is in agreement. Dr. Johney Frame  would prefer to defer PPM at this point for tachy/brady.   F/u in afib clinic,  11/28/20. He is back in afib clinic  being loaded on amiodarone for failing Tikosyn and afib with RVR. He is now on amiodarone for around 10 days. He appears to be tolerating afib well. Ekg shows afib at 109 bpm. We discussed I will plan to cardiovert in another 2-3 weeks. No missed anticoagulation.  F/u in afib clinic, 12/10/20. He has now been on amiodarone for one month and will schedule for cardioversion. He is tolerating afib well. He is now on amiodarone 200  mg daily. He states no missed anticoagulation.   F/u in afib clinic 10/27 after successful cardioversion and remains in SR today. He has not noted a lot of improvement but has noted less swelling in his ankles. His energy stayed good when he was in afib with RVR. He is on amiodarone 200 mg daily.   F/u in afib clinic, 11/30, he remains in SR. He feels well.Continues on amiodarone 200 mg daily.   Pt  being seen today ,04/08/21, for his PCP picking up an elevated HR on his visit last week. He did not note anything abnormal. His ekg today shows sinus brady at 45 bpm. He is not symptomatic with this. His BP is elevated today. He has just returned  from a cruise and has had tro bring in food as his wife hurt her leg and has not been able to cook.   F/u in afib clinic, 07/15/21. He is in sinus brady with PAC's. He states that he is not symptomatic.In the past, I have sent to Dr. Johney Frame for consideration of a PPM but he felt he was not symptomatic enough to  warrant one. If I reduce amio, he may return to afib with RVR, hence tachy/brady.We discussed placing a monitor today.   F/u in Afib clinic, 02/25/22. He remains in Sinus brady in the 40's but asymptomatic. Labs drawn for amiodarone use. He feels well. No complaints today. He wore a monitor in June which showed SR with no triggered episodes, no unusual pauses or indication for a PPM.. Since he has been in SR with the brady, no recent issues with afib, I will now try to reduce amiodarone  to 100 mg  daily. Pt's wife died in December 31, 2022, complications from a bowel obstruction.No symptoms of afib.   F/u  in afib clinic. He is on amiodarone 100 mg daily and HR 52 bpm. He is not symptomatic with this. He continues to work. No complaints voiced.    F/u in Afib clinic, 10/04/22. He is currently in NSR. Patient's daughter called on 7/10 noting lower extremity swelling without SOB or weight gain noted. Started lasix 20 mg x 5 days and improvement noted so transitioned back to lasix prn swelling. Daughter noted primarily his left ankle would swell sometimes after a day of walking. He feels well overall. He is currently on amiodarone 100 mg daily. No bleeding issues on Eliquis.   Today, he denies symptoms of palpitations,  shortness of breath, orthopnea, PND  dizziness, presyncope, syncope, or neurologic sequela. The patient is tolerating medications without difficulties and is otherwise without complaint today.   Past Medical History:  Diagnosis Date   Arthralgia    Benign positional vertigo    HTN (hypertension)    Hyperlipidemia    Impaired glucose tolerance    Persistent atrial fibrillation (HCC)    Senile purpura (HCC)    Past Surgical History:  Procedure Laterality Date   CARDIOVERSION N/A 03/29/2016   Procedure: CARDIOVERSION;  Surgeon: Thurmon Fair, MD;  Location: [facility];  Service: Cardiovascular;  Laterality: N/A;   CARDIOVERSION N/A 12/22/2020   Procedure: CARDIOVERSION;  Surgeon: Thomasene Ripple, DO;  Location: [facility];  Service: Cardiovascular;  Laterality: N/A;   CATARACT EXTRACTION W/ INTRAOCULAR LENS  IMPLANT, BILATERAL Bilateral 2017   ORIF METACARPAL FRACTURE Left ~ 2016   S/P fall   TONSILLECTOMY      Current Outpatient Medications  Medication Sig Dispense Refill   amiodarone (PACERONE) 200 MG tablet Takes 1/2 tablet by mouth daily  apixaban (ELIQUIS) 5 MG TABS tablet Take 1 tablet (5 mg total) by mouth 2 (two) times daily. 180 tablet 3   Ascorbic Acid (VITAMIN  C) 1000 MG tablet Take 1,000 mg by mouth daily.     carboxymethylcellulose (REFRESH PLUS) 0.5 % SOLN Place 1 drop into both eyes as needed.     cholecalciferol (VITAMIN D3) 25 MCG (1000 UNIT) tablet Take 1,000 Units by mouth daily.     furosemide (LASIX) 20 MG tablet Take 1 tablet (20 mg total) by mouth daily. (Patient taking differently: Take 20 mg by mouth as needed.) 30 tablet 3   latanoprost (XALATAN) 0.005 % ophthalmic solution 1 drop as needed.     rosuvastatin (CRESTOR) 20 MG tablet Take 20 mg by mouth daily.  5   No current facility-administered medications for this encounter.    No Known Allergies  ROS- All systems are reviewed and negative except as per the HPI above  Physical Exam: Vitals:   10/04/22 1432  Weight: 74.7 kg  Height: 6\' 1"  (1.854 m)     Wt Readings from Last 3 Encounters:  10/04/22 74.7 kg  05/11/22 73.3 kg  02/25/22 74.5 kg    Labs: Lab Results  Component Value Date   NA 139 05/11/2022   K 4.1 05/11/2022   CL 107 05/11/2022   CO2 27 05/11/2022   GLUCOSE 100 (H) 05/11/2022   BUN 22 05/11/2022   CREATININE 1.16 05/11/2022   CALCIUM 8.8 (L) 05/11/2022   MG 2.2 11/05/2020   No results found for: "INR" No results found for: "CHOL", "HDL", "LDLCALC", "TRIG"   GEN- The patient is well appearing, alert and oriented x 3 today.   Neck - no JVD or carotid bruit noted Lungs- Clear to ausculation bilaterally, normal work of breathing Heart- Regular bradycardic rate and rhythm, no murmurs, rubs or gallops, PMI not laterally displaced Extremities- no clubbing, cyanosis, or edema. No pitting edema. Skin - no rash or ecchymosis noted  EKG-   Vent. rate 44 BPM PR interval 196 ms QRS duration 102 ms QT/QTcB 474/405 ms P-R-T axes 34 0 24 Marked sinus bradycardia Abnormal ECG When compared with ECG of 11-May-2022 09:29, PREVIOUS ECG IS PRESENT  ECHO 12/06/2016: Study Conclusions   - Left ventricle: The cavity size was normal. Wall thickness was     increased in a pattern of mild LVH. Systolic function was normal.    The estimated ejection fraction was in the range of 50% to 55%.    Doppler parameters are consistent with abnormal left ventricular    relaxation (grade 1 diastolic dysfunction).  - Mitral valve: Moderately calcified annulus. Mildly thickened    leaflets . There was mild regurgitation. Valve area by continuity    equation (using LVOT flow): 1.45 cm^2.    Assessment and Plan: 1. Afib/flutter  Failed tikosyn, it was stopped and then pt loaded  on amiodarone Successful cardioversion 10/17 and remains in SR.   Review of records demonstrate patient is in a slow sinus bradycardia, asymptomatic, and prior monitor did not show any need for PPM.  Continue amiodarone 100 mg daily.  Continue lasix prn, recommendation of compression hose.   2. Secondary hypercoagulable state due to Afib.  Continue apixaban 5 mg bid for chadsvasc score of 3  Cmet and TSH drawn today.  3.  HTN Stable     F/u in 6 months Afib clinic.    Lake Bells, PA-C [facility] [address] [place], [place]  [address] [phone] 

## 2022-10-07 DIAGNOSIS — E785 Hyperlipidemia, unspecified: Secondary | ICD-10-CM | POA: Diagnosis not present

## 2022-10-07 DIAGNOSIS — Z125 Encounter for screening for malignant neoplasm of prostate: Secondary | ICD-10-CM | POA: Diagnosis not present

## 2022-10-11 DIAGNOSIS — Z1339 Encounter for screening examination for other mental health and behavioral disorders: Secondary | ICD-10-CM | POA: Diagnosis not present

## 2022-10-11 DIAGNOSIS — I10 Essential (primary) hypertension: Secondary | ICD-10-CM | POA: Diagnosis not present

## 2022-10-11 DIAGNOSIS — Z0000 Encounter for general adult medical examination without abnormal findings: Secondary | ICD-10-CM | POA: Diagnosis not present

## 2022-10-11 DIAGNOSIS — I480 Paroxysmal atrial fibrillation: Secondary | ICD-10-CM | POA: Diagnosis not present

## 2022-10-11 DIAGNOSIS — Z1331 Encounter for screening for depression: Secondary | ICD-10-CM | POA: Diagnosis not present

## 2022-10-11 DIAGNOSIS — R82998 Other abnormal findings in urine: Secondary | ICD-10-CM | POA: Diagnosis not present

## 2022-10-30 ENCOUNTER — Other Ambulatory Visit (HOSPITAL_COMMUNITY): Payer: Self-pay | Admitting: Physician Assistant

## 2023-02-17 DIAGNOSIS — L570 Actinic keratosis: Secondary | ICD-10-CM | POA: Diagnosis not present

## 2023-02-17 DIAGNOSIS — L814 Other melanin hyperpigmentation: Secondary | ICD-10-CM | POA: Diagnosis not present

## 2023-02-17 DIAGNOSIS — C4441 Basal cell carcinoma of skin of scalp and neck: Secondary | ICD-10-CM | POA: Diagnosis not present

## 2023-02-17 DIAGNOSIS — C44319 Basal cell carcinoma of skin of other parts of face: Secondary | ICD-10-CM | POA: Diagnosis not present

## 2023-02-17 DIAGNOSIS — L84 Corns and callosities: Secondary | ICD-10-CM | POA: Diagnosis not present

## 2023-02-17 DIAGNOSIS — D044 Carcinoma in situ of skin of scalp and neck: Secondary | ICD-10-CM | POA: Diagnosis not present

## 2023-02-17 DIAGNOSIS — L219 Seborrheic dermatitis, unspecified: Secondary | ICD-10-CM | POA: Diagnosis not present

## 2023-02-17 DIAGNOSIS — D485 Neoplasm of uncertain behavior of skin: Secondary | ICD-10-CM | POA: Diagnosis not present

## 2023-03-21 ENCOUNTER — Other Ambulatory Visit (HOSPITAL_COMMUNITY): Payer: Self-pay

## 2023-03-21 MED ORDER — AMIODARONE HCL 200 MG PO TABS: ORAL_TABLET | ORAL | 1 refills | Status: DC

## 2023-03-30 DIAGNOSIS — C44319 Basal cell carcinoma of skin of other parts of face: Secondary | ICD-10-CM | POA: Diagnosis not present

## 2023-04-07 ENCOUNTER — Encounter (HOSPITAL_COMMUNITY): Payer: Self-pay

## 2023-04-07 ENCOUNTER — Ambulatory Visit (HOSPITAL_COMMUNITY): Payer: Medicare Other | Admitting: Internal Medicine

## 2023-04-07 NOTE — Progress Notes (Incomplete)
Primary Care Physician: Geoffry Paradise, MD Referring Physician:Dr. Johney Frame   Sean Bowman is a 89 y.o. male with a h/o persistent afib that was diagnosed in December of 2017. He was started on anticoagulation and weeks later had DCCV which was unsuccessful. At time of cardioversion Cardizem was doubled for better rate control and simvastatin was stopped due to drug drug interaction concerns. He was later started on rosuvastatin.  Echo showed EF of 40-45%. It was decided that he would benefit from restoring SR with tikosyn with probable TMC. He was admitted for Tikosyn initiation.He returned to SR and qtc was stable. He was eventually taken off Cardizem and some ankle edema he had improved and he only takes lasix as needed. Left on low dose BB. BB was held at one point, and had return of afib.  F/u in afib clinic, 03/16/17, for general  f/u of tikosyn. He is staying in SR. He has had all over joint discomfort including the chest, shoulders and back. He saw a specialist and is thought to have some sort of arthritis and is on prednisone which does help with symptoms. No bleeding issues with eliquis.  F/u in afib clinic 4/22,he is doing well staying in SR with Tikosyn.  He has a HR in the 40's, which is chronic but is not symptomatic. 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 No bleeding issues with eliquis 5 mg bid.  F/u in afib clinic 8/28. He feels well. He again has a HR in the upper 40's but is asymptomatic. He is taking eliquis on a regular basis. Being compliant with dofetilide as well.  F/u in afib clinic, 12/14. He appears to be at his baseline. No afib to report just concerned re the slow HR, from  which he does not appear to be symptomatic. We have discussed in the past trying to stop the BB and today, he would like to pursue this. Reports some light nosebleeds.   F/u in afib clinic, 6/9. He feels well. He is staying in SR. No complaints other than very mild dizziness with  position change. He is not currently  on any medication that could contribute to this.   F/u 12/12/18. He reports that he feels well. No issues with afib. Continues on Tikosyn. No issues with anticoagulation.  F/i in afib clinic 03/15/19. He has not noted any afib. Continues on tikosyn and apixaban 5 mg bid for a CHA2DS2VASc of 3. No issues with bleeding. HR's in the 40's today that has been present in the past. He is not symptomatic nor on rate control.   F/u in afib clinic, 06/13/19.He remains in sinus brady which is  chronic and he tolerates well Has some mild LLE at end of the day that goes away by the next morning. No complaints today. Being compliant with tikosyn and eliquis. No afib to report.   F/u afib clinic, 09/12/19. No afib to report. Has a resting HR in the 40's but when walked down the hall, the HR picked up appropriately  to the mid 60's. He is not symptomatic with the bradycardia. The bradycardia is symptomatic and he is now on any AV nodal blocking drugs. He feels well. He will soon turn 89. No issues with DOAC, takes Tikosyn on a regular basis. He has both covid shots.    F/u in afib clinic, 12/17/19. Remains in sinus brady at 51 bpm and reports no afib awareness. He has c/o of dizziness from time to time  but is stating that is more noticeable lately. This is with sitting and standing. He is not on any AV nodal blocking drugs. His BP is elevated today, he does not monitor his HR or BP at home. I did walk him last time he was in the clinic and his HR did pick up appropriately with exercise.  Continues on eliquis, no bleeding issues, CHA2DS2VASc score of 3.   F/u in afib clinic 04/09/20. He saw Dr. Johney Frame in November and pt had a low risk stress test. Dr. Johney Frame also offered him a PPM, with his low HR's and  intermittent dizziness. Pt declined. He remains in SR with Tikosyn. EKg shows sinus brady at 50 bpm today. He has not noted much dizziness lately. He continues to  work. Qtc stable.   F/u  with afib clinic, 07/24/20. Ekg shows afib with rvr at 134 bpm.  He was unaware. He states that he came down suddenly with fatigue and head congestion 2 days ago. He denies fever, cough, shortness of breath with exertion. PO 97%.  He has been using OC decongestants.  CHA2DS2VASc score of 3. He continues on eliquis. He has not spoken to PCP re recent symptoms.   F/u  in afib clinic, 11/05/20. He did convert to SR with  BB added after last visit in May when he was asymptomatic with afib with RVR. He was sick at the time. After he converted , BB was again made prn, as he is slow in SR. Today EKG shows atrial flutter with RVR at 135 bpm. He is not really aware of being out of rhythm but feels he may have been off for the last several weeks. 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 Denies any recent sickness.he continues on tikosyn and eliquis.   F/u in afib clinic, 11/12/20, after being found back in afib earlier in week and coming back for EKG's for rhythm check. He initially converted when metoprolol 12.5 mg bid was added. He continues in afib with RVR. I am concerned to push BB as he has a HR in the 40's in the past with low dose BB. He has been asymptomatic with bradycardia in the past and the only issue he has noted recently with the RVR is that he feels anxious in his chest at times. At his advanced age, I am concerned that he could deteriorate with RVR. He was suppose to drive to a wedding in [place] today but I have advised against this as I am afraid he may become lightheaded behind the wheel. He agrees not to go. He still works running his own business. I discussed with Dr. Johney Frame and he feels that tikosyn should be stopped and amiodarone started. Pt is in agreement. Dr. Johney Frame  would prefer to defer PPM at this point for tachy/brady.   F/u in afib clinic,  11/28/20. He is back in afib clinic  being loaded on amiodarone for failing Tikosyn and afib with RVR. He is now on amiodarone for around 10 days. He appears to be tolerating afib well. Ekg shows afib at 109 bpm. We discussed I will plan to cardiovert in another 2-3 weeks. No missed anticoagulation.  F/u in afib clinic, 12/10/20. He has now been on amiodarone for one month and will schedule for cardioversion. He is tolerating afib well. He is now on amiodarone 200  mg daily. He states no missed anticoagulation.   F/u in afib clinic 10/27 after successful cardioversion and remains in SR today. He has not noted a lot of improvement but has noted less swelling in his ankles. His energy stayed good when he was in afib with RVR. He is on amiodarone 200 mg daily.   F/u in afib clinic, 11/30, he remains in SR. He feels well.Continues on amiodarone 200 mg daily.   Pt  being seen today ,04/08/21, for his PCP picking up an elevated HR on his visit last week. He did not note anything abnormal. His ekg today shows sinus brady at 45 bpm. He is not symptomatic with this. His BP is elevated today. He has just returned  from a cruise and has had tro bring in food as his wife hurt her leg and has not been able to cook.   F/u in afib clinic, 07/15/21. He is in sinus brady with PAC's. He states that he is not symptomatic.In the past, I have sent to Dr. Johney Frame for consideration of a PPM but he felt he was not symptomatic enough to  warrant one. If I reduce amio, he may return to afib with RVR, hence tachy/brady.We discussed placing a monitor today.   F/u in Afib clinic, 02/25/22. He remains in Sinus brady in the 40's but asymptomatic. Labs drawn for amiodarone use. He feels well. No complaints today. He wore a monitor in June which showed SR with no triggered episodes, no unusual pauses or indication for a PPM.. Since he has been in SR with the brady, no recent issues with afib, I will now try to reduce amiodarone  to 100 mg  daily. Pt's wife died in 01/07/2024, complications from a bowel obstruction.No symptoms of afib.   F/u  in afib clinic. He is on amiodarone 100 mg daily and HR 52 bpm. He is not symptomatic with this. He continues to work. No complaints voiced.    F/u in Afib clinic, 10/04/22. He is currently in NSR. Patient's daughter called on 7/10 noting lower extremity swelling without SOB or weight gain noted. Started lasix 20 mg x 5 days and improvement noted so transitioned back to lasix prn swelling. Daughter noted primarily his left ankle would swell sometimes after a day of walking. He feels well overall. He is currently on amiodarone 100 mg daily. No bleeding issues on Eliquis.   F/u in Afib clinic, 04/07/23. He is currently in ***. He has had *** episodes of Afib since last office visit. He is on amiodarone 100 mg daily. No bleeding issues on Eliquis.   Today, he denies symptoms of palpitations,  shortness of breath, orthopnea, PND  dizziness, presyncope, syncope, or neurologic sequela. The patient is tolerating medications without difficulties and is otherwise without complaint today.   Past Medical History:  Diagnosis Date   Arthralgia    Benign positional vertigo    HTN (hypertension)    Hyperlipidemia    Impaired glucose tolerance    Persistent atrial fibrillation (HCC)    Senile purpura (HCC)    Past Surgical History:  Procedure Laterality Date   CARDIOVERSION N/A 03/29/2016   Procedure: CARDIOVERSION;  Surgeon: Thurmon Fair, MD;  Location: [facility];  Service: Cardiovascular;  Laterality: N/A;   CARDIOVERSION N/A 12/22/2020   Procedure: CARDIOVERSION;  Surgeon: Thomasene Ripple, DO;  Location: [facility];  Service: Cardiovascular;  Laterality: N/A;   CATARACT EXTRACTION W/ INTRAOCULAR LENS  IMPLANT, BILATERAL Bilateral 2017   ORIF METACARPAL FRACTURE Left ~  2016   S/P fall   TONSILLECTOMY      Current Outpatient Medications  Medication Sig Dispense Refill   amiodarone (PACERONE) 200  MG tablet Takes 1/2 tablet by mouth daily 45 tablet 1   apixaban (ELIQUIS) 5 MG TABS tablet Take 1 tablet (5 mg total) by mouth 2 (two) times daily. 180 tablet 3   Ascorbic Acid (VITAMIN C) 1000 MG tablet Take 1,000 mg by mouth daily.     carboxymethylcellulose (REFRESH PLUS) 0.5 % SOLN Place 1 drop into both eyes as needed.     cholecalciferol (VITAMIN D3) 25 MCG (1000 UNIT) tablet Take 1,000 Units by mouth daily.     furosemide (LASIX) 20 MG tablet Take 1 tablet (20 mg total) by mouth daily as needed (swelling/fluid retention). 90 tablet 1   latanoprost (XALATAN) 0.005 % ophthalmic solution 1 drop as needed.     rosuvastatin (CRESTOR) 20 MG tablet Take 20 mg by mouth daily.  5   No current facility-administered medications for this visit.    No Known Allergies  ROS- All systems are reviewed and negative except as per the HPI above  Physical Exam: There were no vitals filed for this visit.    Wt Readings from Last 3 Encounters:  10/04/22 74.7 kg  05/11/22 73.3 kg  02/25/22 74.5 kg    Labs: Lab Results  Component Value Date   NA 140 10/04/2022   K 3.7 10/04/2022   CL 105 10/04/2022   CO2 26 10/04/2022   GLUCOSE 151 (H) 10/04/2022   BUN 19 10/04/2022   CREATININE 1.22 10/04/2022   CALCIUM 8.6 (L) 10/04/2022   MG 2.2 11/05/2020   No results found for: "INR" No results found for: "CHOL", "HDL", "LDLCALC", "TRIG"  GEN- The patient is well appearing, alert and oriented x 3 today.   Neck - no JVD or carotid bruit noted Lungs- Clear to ausculation bilaterally, normal work of breathing Heart- ***Regular rate and rhythm, no murmurs, rubs or gallops, PMI not laterally displaced Extremities- no clubbing, cyanosis, or edema Skin - no rash or ecchymosis noted   EKG-   ***  ECHO 12/06/2016: Study Conclusions   - Left ventricle: The cavity size was normal. Wall thickness was    increased in a pattern of mild LVH. Systolic function was normal.    The estimated ejection  fraction was in the range of 50% to 55%.    Doppler parameters are consistent with abnormal left ventricular    relaxation (grade 1 diastolic dysfunction).  - Mitral valve: Moderately calcified annulus. Mildly thickened    leaflets . There was mild regurgitation. Valve area by continuity    equation (using LVOT flow): 1.45 cm^2.    Assessment and Plan: 1. Afib/flutter  Failed tikosyn, it was stopped and then pt loaded  on amiodarone Successful cardioversion 10/17 and remains in SR.   He is currently in ***.   High risk medication monitoring (ICD10: [id number]) Patient requires ongoing monitoring for anti-arrhythmic medication which has the potential to cause life threatening arrhythmias or AV block. Qtc stable. Continue amiodarone 100 mg once daily. Cmet and TSH drawn today.   2. Secondary hypercoagulable state due to Afib.  Continue Eliquis 5 mg BID. Dose is correct based on weight > 60 kg and creatinine below 1.5.   3.  HTN ***    Follow up in 6 months for amiodarone surveillance.    Lake Bells, PA-C [facility] [address] [place], [place] [address]  [phone] 

## 2023-04-13 DIAGNOSIS — Z23 Encounter for immunization: Secondary | ICD-10-CM | POA: Diagnosis not present

## 2023-04-13 DIAGNOSIS — I10 Essential (primary) hypertension: Secondary | ICD-10-CM | POA: Diagnosis not present

## 2023-04-20 DIAGNOSIS — D044 Carcinoma in situ of skin of scalp and neck: Secondary | ICD-10-CM | POA: Diagnosis not present

## 2023-04-20 DIAGNOSIS — C4441 Basal cell carcinoma of skin of scalp and neck: Secondary | ICD-10-CM | POA: Diagnosis not present

## 2023-04-27 DIAGNOSIS — K080 Exfoliation of teeth due to systemic causes: Secondary | ICD-10-CM | POA: Diagnosis not present

## 2023-05-09 DIAGNOSIS — K080 Exfoliation of teeth due to systemic causes: Secondary | ICD-10-CM | POA: Diagnosis not present

## 2023-05-11 DIAGNOSIS — K080 Exfoliation of teeth due to systemic causes: Secondary | ICD-10-CM | POA: Diagnosis not present

## 2023-05-20 DIAGNOSIS — H18413 Arcus senilis, bilateral: Secondary | ICD-10-CM | POA: Diagnosis not present

## 2023-05-20 DIAGNOSIS — H401131 Primary open-angle glaucoma, bilateral, mild stage: Secondary | ICD-10-CM | POA: Diagnosis not present

## 2023-05-20 DIAGNOSIS — Z961 Presence of intraocular lens: Secondary | ICD-10-CM | POA: Diagnosis not present

## 2023-05-20 DIAGNOSIS — H04123 Dry eye syndrome of bilateral lacrimal glands: Secondary | ICD-10-CM | POA: Diagnosis not present

## 2023-05-25 DIAGNOSIS — I4811 Longstanding persistent atrial fibrillation: Secondary | ICD-10-CM | POA: Diagnosis not present

## 2023-05-25 DIAGNOSIS — I10 Essential (primary) hypertension: Secondary | ICD-10-CM | POA: Diagnosis not present

## 2023-05-27 ENCOUNTER — Ambulatory Visit (HOSPITAL_COMMUNITY)
Admission: RE | Admit: 2023-05-27 | Discharge: 2023-05-27 | Disposition: A | Discharge status: Home / Self Care | Source: Ambulatory Visit | Attending: Internal Medicine | Admitting: Internal Medicine

## 2023-05-27 VITALS — BP 150/90 | HR 107 | Ht 73.0 in | Wt 167.8 lb

## 2023-05-27 DIAGNOSIS — I4510 Unspecified right bundle-branch block: Secondary | ICD-10-CM | POA: Insufficient documentation

## 2023-05-27 DIAGNOSIS — Z5181 Encounter for therapeutic drug level monitoring: Secondary | ICD-10-CM | POA: Diagnosis not present

## 2023-05-27 DIAGNOSIS — I10 Essential (primary) hypertension: Secondary | ICD-10-CM | POA: Diagnosis not present

## 2023-05-27 DIAGNOSIS — Z7901 Long term (current) use of anticoagulants: Secondary | ICD-10-CM | POA: Insufficient documentation

## 2023-05-27 DIAGNOSIS — I4819 Other persistent atrial fibrillation: Secondary | ICD-10-CM | POA: Insufficient documentation

## 2023-05-27 DIAGNOSIS — D6869 Other thrombophilia: Secondary | ICD-10-CM | POA: Diagnosis not present

## 2023-05-27 DIAGNOSIS — Z79899 Other long term (current) drug therapy: Secondary | ICD-10-CM | POA: Insufficient documentation

## 2023-05-27 MED ORDER — AMIODARONE HCL 200 MG PO TABS: ORAL_TABLET | ORAL | 1 refills | Status: DC

## 2023-05-27 NOTE — Patient Instructions (Addendum)
 Increase Amiodarone to 200 mg - taking 1 tablet by mouth twice daily for 1 month

## 2023-05-27 NOTE — Progress Notes (Signed)
 Primary Care Physician: Geoffry Paradise, MD Referring Physician:Dr. Johney Frame   Sean Bowman is a 89 y.o. male with a h/o persistent afib that was diagnosed in December of 2017. He was started on anticoagulation and weeks later had DCCV which was unsuccessful. At time of cardioversion Cardizem was doubled for better rate control and simvastatin was stopped due to drug drug interaction concerns. He was later started on rosuvastatin.  Echo showed EF of 40-45%. It was decided that he would benefit from restoring SR with tikosyn with probable TMC. He was admitted for Tikosyn initiation.He returned to SR and qtc was stable. He was eventually taken off Cardizem and some ankle edema he had improved and he only takes lasix as needed. Left on low dose BB. BB was held at one point, and had return of afib.  F/u in afib clinic, 03/16/17, for general  f/u of tikosyn. He is staying in SR. He has had all over joint discomfort including the chest, shoulders and back. He saw a specialist and is thought to have some sort of arthritis and is on prednisone which does help with symptoms. No bleeding issues with eliquis.  F/u in afib clinic 4/22,he is doing well staying in SR with Tikosyn.  He has a HR in the 40's, which is chronic but is not symptomatic. 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 No bleeding issues with eliquis 5 mg bid.  F/u in afib clinic 8/28. He feels well. He again has a HR in the upper 40's but is asymptomatic. He is taking eliquis on a regular basis. Being compliant with dofetilide as well.  F/u in afib clinic, 12/14. He appears to be at his baseline. No afib to report just concerned re the slow HR, from  which he does not appear to be symptomatic. We have discussed in the past trying to stop the BB and today, he would like to pursue this. Reports some light nosebleeds.   F/u in afib clinic, 6/9. He feels well. He is staying in SR. No complaints other than very mild dizziness with  position change. He is not currently  on any medication that could contribute to this.   F/u 12/12/18. He reports that he feels well. No issues with afib. Continues on Tikosyn. No issues with anticoagulation.  F/i in afib clinic 03/15/19. He has not noted any afib. Continues on tikosyn and apixaban 5 mg bid for a CHA2DS2VASc of 3. No issues with bleeding. HR's in the 40's today that has been present in the past. He is not symptomatic nor on rate control.   F/u in afib clinic, 06/13/19.He remains in sinus brady which is  chronic and he tolerates well Has some mild LLE at end of the day that goes away by the next morning. No complaints today. Being compliant with tikosyn and eliquis. No afib to report.   F/u afib clinic, 09/12/19. No afib to report. Has a resting HR in the 40's but when walked down the hall, the HR picked up appropriately  to the mid 60's. He is not symptomatic with the bradycardia. The bradycardia is symptomatic and he is now on any AV nodal blocking drugs. He feels well. He will soon turn 89. No issues with DOAC, takes Tikosyn on a regular basis. He has both covid shots.    F/u in afib clinic, 12/17/19. Remains in sinus brady at 51 bpm and reports no afib awareness. He has c/o of dizziness from time to time  but is stating that is more noticeable lately. This is with sitting and standing. He is not on any AV nodal blocking drugs. His BP is elevated today, he does not monitor his HR or BP at home. I did walk him last time he was in the clinic and his HR did pick up appropriately with exercise.  Continues on eliquis, no bleeding issues, CHA2DS2VASc score of 3.   F/u in afib clinic 04/09/20. He saw Dr. Johney Frame in November and pt had a low risk stress test. Dr. Johney Frame also offered him a PPM, with his low HR's and  intermittent dizziness. Pt declined. He remains in SR with Tikosyn. EKg shows sinus brady at 50 bpm today. He has not noted much dizziness lately. He continues to  work. Qtc stable.   F/u  with afib clinic, 07/24/20. Ekg shows afib with rvr at 134 bpm.  He was unaware. He states that he came down suddenly with fatigue and head congestion 2 days ago. He denies fever, cough, shortness of breath with exertion. PO 97%.  He has been using OC decongestants.  CHA2DS2VASc score of 3. He continues on eliquis. He has not spoken to PCP re recent symptoms.   F/u  in afib clinic, 11/05/20. He did convert to SR with  BB added after last visit in May when he was asymptomatic with afib with RVR. He was sick at the time. After he converted , BB was again made prn, as he is slow in SR. Today EKG shows atrial flutter with RVR at 135 bpm. He is not really aware of being out of rhythm but feels he may have been off for the last several weeks. 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 Denies any recent sickness.he continues on tikosyn and eliquis.   F/u in afib clinic, 11/12/20, after being found back in afib earlier in week and coming back for EKG's for rhythm check. He initially converted when metoprolol 12.5 mg bid was added. He continues in afib with RVR. I am concerned to push BB as he has a HR in the 40's in the past with low dose BB. He has been asymptomatic with bradycardia in the past and the only issue he has noted recently with the RVR is that he feels anxious in his chest at times. At his advanced age, I am concerned that he could deteriorate with RVR. He was suppose to drive to a wedding in [place] today but I have advised against this as I am afraid he may become lightheaded behind the wheel. He agrees not to go. He still works running his own business. I discussed with Dr. Johney Frame and he feels that tikosyn should be stopped and amiodarone started. Pt is in agreement. Dr. Johney Frame  would prefer to defer PPM at this point for tachy/brady.   F/u in afib clinic,  11/28/20. He is back in afib clinic  being loaded on amiodarone for failing Tikosyn and afib with RVR. He is now on amiodarone for around 10 days. He appears to be tolerating afib well. Ekg shows afib at 109 bpm. We discussed I will plan to cardiovert in another 2-3 weeks. No missed anticoagulation.  F/u in afib clinic, 12/10/20. He has now been on amiodarone for one month and will schedule for cardioversion. He is tolerating afib well. He is now on amiodarone 200  mg daily. He states no missed anticoagulation.   F/u in afib clinic 10/27 after successful cardioversion and remains in SR today. He has not noted a lot of improvement but has noted less swelling in his ankles. His energy stayed good when he was in afib with RVR. He is on amiodarone 200 mg daily.   F/u in afib clinic, 11/30, he remains in SR. He feels well.Continues on amiodarone 200 mg daily.   Pt  being seen today ,04/08/21, for his PCP picking up an elevated HR on his visit last week. He did not note anything abnormal. His ekg today shows sinus brady at 45 bpm. He is not symptomatic with this. His BP is elevated today. He has just returned  from a cruise and has had tro bring in food as his wife hurt her leg and has not been able to cook.   F/u in afib clinic, 07/15/21. He is in sinus brady with PAC's. He states that he is not symptomatic.In the past, I have sent to Dr. Johney Frame for consideration of a PPM but he felt he was not symptomatic enough to  warrant one. If I reduce amio, he may return to afib with RVR, hence tachy/brady. We discussed placing a monitor today.   F/u in Afib clinic, 02/25/22. He remains in Sinus brady in the 40's but asymptomatic. Labs drawn for amiodarone use. He feels well. No complaints today. He wore a monitor in June which showed SR with no triggered episodes, no unusual pauses or indication for a PPM. Since he has been in SR with the brady, no recent issues with afib, I will now try to reduce amiodarone  to 100 mg  daily. Pt's wife died in 2024-01-09, complications from a bowel obstruction.No symptoms of afib.   F/u  in afib clinic. He is on amiodarone 100 mg daily and HR 52 bpm. He is not symptomatic with this. He continues to work. No complaints voiced.    F/u in Afib clinic, 10/04/22. He is currently in NSR. Patient's daughter called on 7/10 noting lower extremity swelling without SOB or weight gain noted. Started lasix 20 mg x 5 days and improvement noted so transitioned back to lasix prn swelling. Daughter noted primarily his left ankle would swell sometimes after a day of walking. He feels well overall. He is currently on amiodarone 100 mg daily. No bleeding issues on Eliquis.   F/u in Afib clinic, 05/27/23. He currently appears to be in atrial flutter with RVR. He believes he felt his heart start racing last week. He takes amiodarone 100 mg daily. No missed doses of Eliquis 5 mg BID.   Today, he denies symptoms of palpitations,  shortness of breath, orthopnea, PND  dizziness, presyncope, syncope, or neurologic sequela. The patient is tolerating medications without difficulties and is otherwise without complaint today.   Past Medical History:  Diagnosis Date   Arthralgia    Benign positional vertigo    HTN (hypertension)    Hyperlipidemia    Impaired glucose tolerance    Persistent atrial fibrillation (HCC)    Senile purpura (HCC)    Past Surgical History:  Procedure Laterality Date   CARDIOVERSION N/A 03/29/2016   Procedure: CARDIOVERSION;  Surgeon: Thurmon Fair, MD;  Location: [facility];  Service: Cardiovascular;  Laterality: N/A;   CARDIOVERSION N/A 12/22/2020   Procedure: CARDIOVERSION;  Surgeon: Thomasene Ripple, DO;  Location: [facility];  Service: Cardiovascular;  Laterality: N/A;   CATARACT EXTRACTION W/ INTRAOCULAR LENS  IMPLANT, BILATERAL Bilateral  2017   ORIF METACARPAL FRACTURE Left ~ 2016   S/P fall   TONSILLECTOMY      Current Outpatient Medications  Medication Sig Dispense  Refill   amiodarone (PACERONE) 200 MG tablet Takes 1/2 tablet by mouth daily 45 tablet 1   apixaban (ELIQUIS) 5 MG TABS tablet Take 1 tablet (5 mg total) by mouth 2 (two) times daily. 180 tablet 3   Ascorbic Acid (VITAMIN C) 1000 MG tablet Take 1,000 mg by mouth daily.     carboxymethylcellulose (REFRESH PLUS) 0.5 % SOLN Place 1 drop into both eyes as needed.     cholecalciferol (VITAMIN D3) 25 MCG (1000 UNIT) tablet Take 1,000 Units by mouth daily.     furosemide (LASIX) 20 MG tablet Take 1 tablet (20 mg total) by mouth daily as needed (swelling/fluid retention). 90 tablet 1   latanoprost (XALATAN) 0.005 % ophthalmic solution 1 drop as needed.     rosuvastatin (CRESTOR) 20 MG tablet Take 20 mg by mouth daily.  5   No current facility-administered medications for this encounter.    No Known Allergies  ROS- All systems are reviewed and negative except as per the HPI above  Physical Exam: Vitals:   05/27/23 1128  BP: (!) 150/90  Pulse: (!) 107  Weight: 76.1 kg  Height: 6\' 1"  (1.854 m)    Wt Readings from Last 3 Encounters:  05/27/23 76.1 kg  10/04/22 74.7 kg  05/11/22 73.3 kg    Labs: Lab Results  Component Value Date   NA 140 10/04/2022   K 3.7 10/04/2022   CL 105 10/04/2022   CO2 26 10/04/2022   GLUCOSE 151 (H) 10/04/2022   BUN 19 10/04/2022   CREATININE 1.22 10/04/2022   CALCIUM 8.6 (L) 10/04/2022   MG 2.2 11/05/2020   GEN- The patient is well appearing, alert and oriented x 3 today.   Neck - no JVD or carotid bruit noted Lungs- Clear to ausculation bilaterally, normal work of breathing Heart- Irregular rate and rhythm, no murmurs, rubs or gallops, PMI not laterally displaced Extremities- no clubbing, cyanosis, or edema Skin - no rash or ecchymosis noted   EKG-   Vent. rate 107 BPM PR interval * ms QRS duration 154 ms QT/QTcB 388/517 ms P-R-T axes * 59 38 Undetermined rhythm Right bundle branch block Abnormal ECG When compared with ECG of 04-Oct-2022  14:46, PREVIOUS ECG IS PRESENT  ECHO 12/06/2016: Study Conclusions   - Left ventricle: The cavity size was normal. Wall thickness was    increased in a pattern of mild LVH. Systolic function was normal.    The estimated ejection fraction was in the range of 50% to 55%.    Doppler parameters are consistent with abnormal left ventricular    relaxation (grade 1 diastolic dysfunction).  - Mitral valve: Moderately calcified annulus. Mildly thickened    leaflets . There was mild regurgitation. Valve area by continuity    equation (using LVOT flow): 1.45 cm^2.    Assessment and Plan: 1. Afib/flutter  Failed tikosyn, it was stopped and then pt loaded on amiodarone  He is currently in what appears to be atrial flutter with RVR. He would like to try to convert to NSR to feel better. We will reload amiodarone at this time and have patient transition to amiodarone 200 mg BID x 4 weeks. He will follow up in 3 weeks to determine if needs cardioversion. He will ultimately transition to 200 mg once daily on 4/20.   High risk  medication monitoring (ICD10: [id number]) Patient requires ongoing monitoring for anti-arrhythmic medication which has the potential to cause life threatening arrhythmias or AV block.  Reload amiodarone 200 mg BID x 4 weeks.    2. Secondary hypercoagulable state due to Afib.  No missed doses of Eliquis 5 mg BID.   3. HTN Elevated today, will reassess once back in normal rhythm.   Follow up 3 weeks Afib clinic.    Lake Bells, PA-C [facility] [address] [place], [place] [address] [phone]

## 2023-06-04 ENCOUNTER — Other Ambulatory Visit (HOSPITAL_COMMUNITY): Payer: Self-pay | Admitting: Internal Medicine

## 2023-06-17 ENCOUNTER — Ambulatory Visit (HOSPITAL_COMMUNITY)
Admission: RE | Admit: 2023-06-17 | Discharge: 2023-06-17 | Disposition: A | Discharge status: Home / Self Care | Source: Ambulatory Visit | Attending: Internal Medicine | Admitting: Internal Medicine

## 2023-06-17 ENCOUNTER — Encounter (HOSPITAL_COMMUNITY): Payer: Self-pay | Admitting: Internal Medicine

## 2023-06-17 VITALS — BP 126/90 | HR 102 | Ht 73.0 in | Wt 166.0 lb

## 2023-06-17 DIAGNOSIS — I10 Essential (primary) hypertension: Secondary | ICD-10-CM | POA: Insufficient documentation

## 2023-06-17 DIAGNOSIS — D6859 Other primary thrombophilia: Secondary | ICD-10-CM | POA: Insufficient documentation

## 2023-06-17 DIAGNOSIS — D6869 Other thrombophilia: Secondary | ICD-10-CM | POA: Diagnosis not present

## 2023-06-17 DIAGNOSIS — Z7901 Long term (current) use of anticoagulants: Secondary | ICD-10-CM | POA: Insufficient documentation

## 2023-06-17 DIAGNOSIS — I4819 Other persistent atrial fibrillation: Secondary | ICD-10-CM | POA: Diagnosis not present

## 2023-06-17 DIAGNOSIS — I4892 Unspecified atrial flutter: Secondary | ICD-10-CM | POA: Diagnosis not present

## 2023-06-17 DIAGNOSIS — Z5181 Encounter for therapeutic drug level monitoring: Secondary | ICD-10-CM

## 2023-06-17 DIAGNOSIS — Z79899 Other long term (current) drug therapy: Secondary | ICD-10-CM | POA: Insufficient documentation

## 2023-06-17 LAB — CBC
HCT: 40.9 % (ref 39.0–52.0)
Hemoglobin: 13.1 g/dL (ref 13.0–17.0)
MCH: 33.2 pg (ref 26.0–34.0)
MCHC: 32 g/dL (ref 30.0–36.0)
MCV: 103.8 fL — ABNORMAL HIGH (ref 80.0–100.0)
Platelets: 200 10*3/uL (ref 150–400)
RBC: 3.94 MIL/uL — ABNORMAL LOW (ref 4.22–5.81)
RDW: 14.7 % (ref 11.5–15.5)
WBC: 7.7 10*3/uL (ref 4.0–10.5)
nRBC: 0 % (ref 0.0–0.2)

## 2023-06-17 LAB — COMPREHENSIVE METABOLIC PANEL WITH GFR
ALT: 19 U/L (ref 0–44)
AST: 17 U/L (ref 15–41)
Albumin: 3.8 g/dL (ref 3.5–5.0)
Alkaline Phosphatase: 77 U/L (ref 38–126)
Anion gap: 13 (ref 5–15)
BUN: 19 mg/dL (ref 8–23)
CO2: 23 mmol/L (ref 22–32)
Calcium: 8.9 mg/dL (ref 8.9–10.3)
Chloride: 103 mmol/L (ref 98–111)
Creatinine, Ser: 1.3 mg/dL — ABNORMAL HIGH (ref 0.61–1.24)
GFR, Estimated: 51 mL/min — ABNORMAL LOW (ref >60)
Glucose, Bld: 105 mg/dL — ABNORMAL HIGH (ref 70–99)
Potassium: 4.3 mmol/L (ref 3.5–5.1)
Sodium: 139 mmol/L (ref 135–145)
Total Bilirubin: 0.8 mg/dL (ref 0.0–1.2)
Total Protein: 6.7 g/dL (ref 6.5–8.1)

## 2023-06-17 LAB — TSH: TSH: 1.432 u[IU]/mL (ref 0.350–4.500)

## 2023-06-17 NOTE — Progress Notes (Signed)
 Primary Care Physician: Geoffry Paradise, MD Referring Physician:Dr. Johney Frame   Sean Bowman is a 89 y.o. male with a h/o persistent afib that was diagnosed in December of 2017. He was started on anticoagulation and weeks later had DCCV which was unsuccessful. At time of cardioversion Cardizem was doubled for better rate control and simvastatin was stopped due to drug drug interaction concerns. He was later started on rosuvastatin.  Echo showed EF of 40-45%. It was decided that he would benefit from restoring SR with tikosyn with probable TMC. He was admitted for Tikosyn initiation.He returned to SR and qtc was stable. He was eventually taken off Cardizem and some ankle edema he had improved and he only takes lasix as needed. Left on low dose BB. BB was held at one point, and had return of afib.  F/u in afib clinic, 03/16/17, for general  f/u of tikosyn. He is staying in SR. He has had all over joint discomfort including the chest, shoulders and back. He saw a specialist and is thought to have some sort of arthritis and is on prednisone which does help with symptoms. No bleeding issues with eliquis.  F/u in afib clinic 4/22,he is doing well staying in SR with Tikosyn.  He has a HR in the 40's, which is chronic but is not symptomatic. 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 No bleeding issues with eliquis 5 mg bid.  F/u in afib clinic 8/28. He feels well. He again has a HR in the upper 40's but is asymptomatic. He is taking eliquis on a regular basis. Being compliant with dofetilide as well.  F/u in afib clinic, 12/14. He appears to be at his baseline. No afib to report just concerned re the slow HR, from  which he does not appear to be symptomatic. We have discussed in the past trying to stop the BB and today, he would like to pursue this. Reports some light nosebleeds.   F/u in afib clinic, 6/9. He feels well. He is staying in SR. No complaints other than very mild dizziness with  position change. He is not currently  on any medication that could contribute to this.   F/u 12/12/18. He reports that he feels well. No issues with afib. Continues on Tikosyn. No issues with anticoagulation.  F/i in afib clinic 03/15/19. He has not noted any afib. Continues on tikosyn and apixaban 5 mg bid for a CHA2DS2VASc of 3. No issues with bleeding. HR's in the 40's today that has been present in the past. He is not symptomatic nor on rate control.   F/u in afib clinic, 06/13/19.He remains in sinus brady which is  chronic and he tolerates well Has some mild LLE at end of the day that goes away by the next morning. No complaints today. Being compliant with tikosyn and eliquis. No afib to report.   F/u afib clinic, 09/12/19. No afib to report. Has a resting HR in the 40's but when walked down the hall, the HR picked up appropriately  to the mid 60's. He is not symptomatic with the bradycardia. The bradycardia is symptomatic and he is now on any AV nodal blocking drugs. He feels well. He will soon turn 89. No issues with DOAC, takes Tikosyn on a regular basis. He has both covid shots.    F/u in afib clinic, 12/17/19. Remains in sinus brady at 51 bpm and reports no afib awareness. He has c/o of dizziness from time to time  but is stating that is more noticeable lately. This is with sitting and standing. He is not on any AV nodal blocking drugs. His BP is elevated today, he does not monitor his HR or BP at home. I did walk him last time he was in the clinic and his HR did pick up appropriately with exercise.  Continues on eliquis, no bleeding issues, CHA2DS2VASc score of 3.   F/u in afib clinic 04/09/20. He saw Dr. Johney Frame in November and pt had a low risk stress test. Dr. Johney Frame also offered him a PPM, with his low HR's and  intermittent dizziness. Pt declined. He remains in SR with Tikosyn. EKg shows sinus brady at 50 bpm today. He has not noted much dizziness lately. He continues to  work. Qtc stable.   F/u  with afib clinic, 07/24/20. Ekg shows afib with rvr at 134 bpm.  He was unaware. He states that he came down suddenly with fatigue and head congestion 2 days ago. He denies fever, cough, shortness of breath with exertion. PO 97%.  He has been using OC decongestants.  CHA2DS2VASc score of 3. He continues on eliquis. He has not spoken to PCP re recent symptoms.   F/u  in afib clinic, 11/05/20. He did convert to SR with  BB added after last visit in May when he was asymptomatic with afib with RVR. He was sick at the time. After he converted , BB was again made prn, as he is slow in SR. Today EKG shows atrial flutter with RVR at 135 bpm. He is not really aware of being out of rhythm but feels he may have been off for the last several weeks. 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 Denies any recent sickness.he continues on tikosyn and eliquis.   F/u in afib clinic, 11/12/20, after being found back in afib earlier in week and coming back for EKG's for rhythm check. He initially converted when metoprolol 12.5 mg bid was added. He continues in afib with RVR. I am concerned to push BB as he has a HR in the 40's in the past with low dose BB. He has been asymptomatic with bradycardia in the past and the only issue he has noted recently with the RVR is that he feels anxious in his chest at times. At his advanced age, I am concerned that he could deteriorate with RVR. He was suppose to drive to a wedding in [place] today but I have advised against this as I am afraid he may become lightheaded behind the wheel. He agrees not to go. He still works running his own business. I discussed with Dr. Johney Frame and he feels that tikosyn should be stopped and amiodarone started. Pt is in agreement. Dr. Johney Frame  would prefer to defer PPM at this point for tachy/brady.   F/u in afib clinic,  11/28/20. He is back in afib clinic  being loaded on amiodarone for failing Tikosyn and afib with RVR. He is now on amiodarone for around 10 days. He appears to be tolerating afib well. Ekg shows afib at 109 bpm. We discussed I will plan to cardiovert in another 2-3 weeks. No missed anticoagulation.  F/u in afib clinic, 12/10/20. He has now been on amiodarone for one month and will schedule for cardioversion. He is tolerating afib well. He is now on amiodarone 200  mg daily. He states no missed anticoagulation.   F/u in afib clinic 10/27 after successful cardioversion and remains in SR today. He has not noted a lot of improvement but has noted less swelling in his ankles. His energy stayed good when he was in afib with RVR. He is on amiodarone 200 mg daily.   F/u in afib clinic, 11/30, he remains in SR. He feels well.Continues on amiodarone 200 mg daily.   Pt  being seen today ,04/08/21, for his PCP picking up an elevated HR on his visit last week. He did not note anything abnormal. His ekg today shows sinus brady at 45 bpm. He is not symptomatic with this. His BP is elevated today. He has just returned  from a cruise and has had tro bring in food as his wife hurt her leg and has not been able to cook.   F/u in afib clinic, 07/15/21. He is in sinus brady with PAC's. He states that he is not symptomatic.In the past, I have sent to Dr. Johney Frame for consideration of a PPM but he felt he was not symptomatic enough to  warrant one. If I reduce amio, he may return to afib with RVR, hence tachy/brady. We discussed placing a monitor today.   F/u in Afib clinic, 02/25/22. He remains in Sinus brady in the 40's but asymptomatic. Labs drawn for amiodarone use. He feels well. No complaints today. He wore a monitor in June which showed SR with no triggered episodes, no unusual pauses or indication for a PPM. Since he has been in SR with the brady, no recent issues with afib, I will now try to reduce amiodarone  to 100 mg  daily. Pt's wife died in 2024/01/13, complications from a bowel obstruction.No symptoms of afib.   F/u  in afib clinic. He is on amiodarone 100 mg daily and HR 52 bpm. He is not symptomatic with this. He continues to work. No complaints voiced.    F/u in Afib clinic, 10/04/22. He is currently in NSR. Patient's daughter called on 7/10 noting lower extremity swelling without SOB or weight gain noted. Started lasix 20 mg x 5 days and improvement noted so transitioned back to lasix prn swelling. Daughter noted primarily his left ankle would swell sometimes after a day of walking. He feels well overall. He is currently on amiodarone 100 mg daily. No bleeding issues on Eliquis.   F/u in Afib clinic, 05/27/23. He currently appears to be in atrial flutter with RVR. He believes he felt his heart start racing last week. He takes amiodarone 100 mg daily. No missed doses of Eliquis 5 mg BID.   F/u in Afib clinic, 05/27/23. He is currently still in Afib. He began amiodarone 200 mg BID reload at last office visit. No missed doses of Eliquis 5 mg BID.   Today, he denies symptoms of palpitations,  shortness of breath, orthopnea, PND  dizziness, presyncope, syncope, or neurologic sequela. The patient is tolerating medications without difficulties and is otherwise without complaint today.   Past Medical History:  Diagnosis Date   Arthralgia    Benign positional vertigo    HTN (hypertension)    Hyperlipidemia    Impaired glucose tolerance    Persistent atrial fibrillation (HCC)    Senile purpura (HCC)    Past Surgical History:  Procedure Laterality Date   CARDIOVERSION N/A 03/29/2016   Procedure: CARDIOVERSION;  Surgeon: Thurmon Fair, MD;  Location: [facility];  Service: Cardiovascular;  Laterality: N/A;   CARDIOVERSION  N/A 12/22/2020   Procedure: CARDIOVERSION;  Surgeon: Thomasene Ripple, DO;  Location: [facility];  Service: Cardiovascular;  Laterality: N/A;   CATARACT EXTRACTION W/ INTRAOCULAR LENS  IMPLANT,  BILATERAL Bilateral 2017   ORIF METACARPAL FRACTURE Left ~ 2016   S/P fall   TONSILLECTOMY      Current Outpatient Medications  Medication Sig Dispense Refill   amiodarone (PACERONE) 200 MG tablet Take 1 tablet by mouth twice daily x 1 month 60 tablet 1   apixaban (ELIQUIS) 5 MG TABS tablet Take 1 tablet (5 mg total) by mouth 2 (two) times daily. 180 tablet 3   Ascorbic Acid (VITAMIN C) 1000 MG tablet Take 1,000 mg by mouth daily.     carboxymethylcellulose (REFRESH PLUS) 0.5 % SOLN Place 1 drop into both eyes as needed.     cholecalciferol (VITAMIN D3) 25 MCG (1000 UNIT) tablet Take 1,000 Units by mouth daily.     furosemide (LASIX) 20 MG tablet TAKE 1 TABLET (20 MG TOTAL) BY MOUTH DAILY AS NEEDED (SWELLING/FLUID RETENTION). 90 tablet 1   latanoprost (XALATAN) 0.005 % ophthalmic solution 1 drop as needed.     rosuvastatin (CRESTOR) 20 MG tablet Take 20 mg by mouth daily.  5   No current facility-administered medications for this encounter.    No Known Allergies  ROS- All systems are reviewed and negative except as per the HPI above  Physical Exam: Vitals:   06/17/23 1134  BP: (!) 126/90  Pulse: (!) 102  Weight: 75.3 kg  Height: 6\' 1"  (1.854 m)     Wt Readings from Last 3 Encounters:  06/17/23 75.3 kg  05/27/23 76.1 kg  10/04/22 74.7 kg    Labs: Lab Results  Component Value Date   NA 140 10/04/2022   K 3.7 10/04/2022   CL 105 10/04/2022   CO2 26 10/04/2022   GLUCOSE 151 (H) 10/04/2022   BUN 19 10/04/2022   CREATININE 1.22 10/04/2022   CALCIUM 8.6 (L) 10/04/2022   MG 2.2 11/05/2020   GEN- The patient is well appearing, alert and oriented x 3 today.   Neck - no JVD or carotid bruit noted Lungs- Clear to ausculation bilaterally, normal work of breathing Heart- Irregular rate and rhythm, no murmurs, rubs or gallops, PMI not laterally displaced Extremities- no clubbing, cyanosis, or edema Skin - no rash or ecchymosis noted   EKG-   Vent. rate 102 BPM PR  interval * ms QRS duration 166 ms QT/QTcB 408/531 ms P-R-T axes * -2 28 Atrial fibrillation with rapid ventricular response Right bundle branch block Abnormal ECG When compared with ECG of 27-May-2023 11:41, PREVIOUS ECG IS PRESENT  ECHO 12/06/2016: Study Conclusions   - Left ventricle: The cavity size was normal. Wall thickness was    increased in a pattern of mild LVH. Systolic function was normal.    The estimated ejection fraction was in the range of 50% to 55%.    Doppler parameters are consistent with abnormal left ventricular    relaxation (grade 1 diastolic dysfunction).  - Mitral valve: Moderately calcified annulus. Mildly thickened    leaflets . There was mild regurgitation. Valve area by continuity    equation (using LVOT flow): 1.45 cm^2.    Assessment and Plan: 1. Afib/flutter  Failed tikosyn, it was stopped and then pt loaded on amiodarone  He is currently in Afib. Continue reload of amiodarone 200 mg BID. He will ultimately transition to 200 mg once daily on 4/20. We discussed the procedure cardioversion to  try to convert to NSR. We discussed the risks vs benefits of this procedure and how ultimately we cannot predict whether a patient will have early return of arrhythmia post procedure. After discussion, the patient wishes to proceed with cardioversion. Labs drawn today.   Informed Consent   Shared Decision Making/Informed Consent The risks (stroke, cardiac arrhythmias rarely resulting in the need for a temporary or permanent pacemaker, skin irritation or burns and complications associated with conscious sedation including aspiration, arrhythmia, respiratory failure and death), benefits (restoration of normal sinus rhythm) and alternatives of a direct current cardioversion were explained in detail to Mr. Vick and he agrees to proceed.      High risk medication monitoring (ICD10: [id number]) Patient requires ongoing monitoring for anti-arrhythmic medication which has  the potential to cause life threatening arrhythmias or AV block. Continue amiodarone reload and transition to 200 mg once daily on 4/20.  Bloodwork for cardioversion includes Cmet and TSH for amiodarone surveillance.   2. Secondary hypercoagulable state due to Afib.  No missed doses.  3. HTN Stable today.    Follow up 3 weeks Afib clinic.    Lake Bells, PA-C [facility] [address] [place], [place] [address] [phone]

## 2023-06-17 NOTE — Patient Instructions (Signed)
 On April 20th reduce Amiodarone to 200mg  once a day   Cardioversion scheduled for: Monday, April 21st   - Arrive at the [facility] and go to admitting at 9:30am   - Do not eat or drink anything after midnight the night prior to your procedure.   - Take all your morning medication (except diabetic medications) with a sip of water prior to arrival.  - You will not be able to drive home after your procedure.    - Do NOT miss any doses of your blood thinner - if you should miss a dose please notify our office immediately.   - If you feel as if you go back into normal rhythm prior to scheduled cardioversion, please notify our office immediately.   If your procedure is canceled in the cardioversion suite you will be charged a cancellation fee.

## 2023-06-17 NOTE — H&P (View-Only) (Signed)
 Primary Care Physician: Geoffry Paradise, MD Referring Physician:Dr. Johney Frame   Sean Bowman is a 89 y.o. male with a h/o persistent afib that was diagnosed in December of 2017. He was started on anticoagulation and weeks later had DCCV which was unsuccessful. At time of cardioversion Cardizem was doubled for better rate control and simvastatin was stopped due to drug drug interaction concerns. He was later started on rosuvastatin.  Echo showed EF of 40-45%. It was decided that he would benefit from restoring SR with tikosyn with probable TMC. He was admitted for Tikosyn initiation.He returned to SR and qtc was stable. He was eventually taken off Cardizem and some ankle edema he had improved and he only takes lasix as needed. Left on low dose BB. BB was held at one point, and had return of afib.  F/u in afib clinic, 03/16/17, for general  f/u of tikosyn. He is staying in SR. He has had all over joint discomfort including the chest, shoulders and back. He saw a specialist and is thought to have some sort of arthritis and is on prednisone which does help with symptoms. No bleeding issues with eliquis.  F/u in afib clinic 4/22,he is doing well staying in SR with Tikosyn.  He has a HR in the 40's, which is chronic but is not symptomatic. 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 No bleeding issues with eliquis 5 mg bid.  F/u in afib clinic 8/28. He feels well. He again has a HR in the upper 40's but is asymptomatic. He is taking eliquis on a regular basis. Being compliant with dofetilide as well.  F/u in afib clinic, 12/14. He appears to be at his baseline. No afib to report just concerned re the slow HR, from  which he does not appear to be symptomatic. We have discussed in the past trying to stop the BB and today, he would like to pursue this. Reports some light nosebleeds.   F/u in afib clinic, 6/9. He feels well. He is staying in SR. No complaints other than very mild dizziness with  position change. He is not currently  on any medication that could contribute to this.   F/u 12/12/18. He reports that he feels well. No issues with afib. Continues on Tikosyn. No issues with anticoagulation.  F/i in afib clinic 03/15/19. He has not noted any afib. Continues on tikosyn and apixaban 5 mg bid for a CHA2DS2VASc of 3. No issues with bleeding. HR's in the 40's today that has been present in the past. He is not symptomatic nor on rate control.   F/u in afib clinic, 06/13/19.He remains in sinus brady which is  chronic and he tolerates well Has some mild LLE at end of the day that goes away by the next morning. No complaints today. Being compliant with tikosyn and eliquis. No afib to report.   F/u afib clinic, 09/12/19. No afib to report. Has a resting HR in the 40's but when walked down the hall, the HR picked up appropriately  to the mid 60's. He is not symptomatic with the bradycardia. The bradycardia is symptomatic and he is now on any AV nodal blocking drugs. He feels well. He will soon turn 89. No issues with DOAC, takes Tikosyn on a regular basis. He has both covid shots.    F/u in afib clinic, 12/17/19. Remains in sinus brady at 51 bpm and reports no afib awareness. He has c/o of dizziness from time to time  but is stating that is more noticeable lately. This is with sitting and standing. He is not on any AV nodal blocking drugs. His BP is elevated today, he does not monitor his HR or BP at home. I did walk him last time he was in the clinic and his HR did pick up appropriately with exercise.  Continues on eliquis, no bleeding issues, CHA2DS2VASc score of 3.   F/u in afib clinic 04/09/20. He saw Dr. Johney Frame in November and pt had a low risk stress test. Dr. Johney Frame also offered him a PPM, with his low HR's and  intermittent dizziness. Pt declined. He remains in SR with Tikosyn. EKg shows sinus brady at 50 bpm today. He has not noted much dizziness lately. He continues to  work. Qtc stable.   F/u  with afib clinic, 07/24/20. Ekg shows afib with rvr at 134 bpm.  He was unaware. He states that he came down suddenly with fatigue and head congestion 2 days ago. He denies fever, cough, shortness of breath with exertion. PO 97%.  He has been using OC decongestants.  CHA2DS2VASc score of 3. He continues on eliquis. He has not spoken to PCP re recent symptoms.   F/u  in afib clinic, 11/05/20. He did convert to SR with  BB added after last visit in May when he was asymptomatic with afib with RVR. He was sick at the time. After he converted , BB was again made prn, as he is slow in SR. Today EKG shows atrial flutter with RVR at 135 bpm. He is not really aware of being out of rhythm but feels he may have been off for the last several weeks. 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 Denies any recent sickness.he continues on tikosyn and eliquis.   F/u in afib clinic, 11/12/20, after being found back in afib earlier in week and coming back for EKG's for rhythm check. He initially converted when metoprolol 12.5 mg bid was added. He continues in afib with RVR. I am concerned to push BB as he has a HR in the 40's in the past with low dose BB. He has been asymptomatic with bradycardia in the past and the only issue he has noted recently with the RVR is that he feels anxious in his chest at times. At his advanced age, I am concerned that he could deteriorate with RVR. He was suppose to drive to a wedding in [place] today but I have advised against this as I am afraid he may become lightheaded behind the wheel. He agrees not to go. He still works running his own business. I discussed with Dr. Johney Frame and he feels that tikosyn should be stopped and amiodarone started. Pt is in agreement. Dr. Johney Frame  would prefer to defer PPM at this point for tachy/brady.   F/u in afib clinic,  11/28/20. He is back in afib clinic  being loaded on amiodarone for failing Tikosyn and afib with RVR. He is now on amiodarone for around 10 days. He appears to be tolerating afib well. Ekg shows afib at 109 bpm. We discussed I will plan to cardiovert in another 2-3 weeks. No missed anticoagulation.  F/u in afib clinic, 12/10/20. He has now been on amiodarone for one month and will schedule for cardioversion. He is tolerating afib well. He is now on amiodarone 200  mg daily. He states no missed anticoagulation.   F/u in afib clinic 10/27 after successful cardioversion and remains in SR today. He has not noted a lot of improvement but has noted less swelling in his ankles. His energy stayed good when he was in afib with RVR. He is on amiodarone 200 mg daily.   F/u in afib clinic, 11/30, he remains in SR. He feels well.Continues on amiodarone 200 mg daily.   Pt  being seen today ,04/08/21, for his PCP picking up an elevated HR on his visit last week. He did not note anything abnormal. His ekg today shows sinus brady at 45 bpm. He is not symptomatic with this. His BP is elevated today. He has just returned  from a cruise and has had tro bring in food as his wife hurt her leg and has not been able to cook.   F/u in afib clinic, 07/15/21. He is in sinus brady with PAC's. He states that he is not symptomatic.In the past, I have sent to Dr. Johney Frame for consideration of a PPM but he felt he was not symptomatic enough to  warrant one. If I reduce amio, he may return to afib with RVR, hence tachy/brady. We discussed placing a monitor today.   F/u in Afib clinic, 02/25/22. He remains in Sinus brady in the 40's but asymptomatic. Labs drawn for amiodarone use. He feels well. No complaints today. He wore a monitor in June which showed SR with no triggered episodes, no unusual pauses or indication for a PPM. Since he has been in SR with the brady, no recent issues with afib, I will now try to reduce amiodarone  to 100 mg  daily. Pt's wife died in 2024/01/13, complications from a bowel obstruction.No symptoms of afib.   F/u  in afib clinic. He is on amiodarone 100 mg daily and HR 52 bpm. He is not symptomatic with this. He continues to work. No complaints voiced.    F/u in Afib clinic, 10/04/22. He is currently in NSR. Patient's daughter called on 7/10 noting lower extremity swelling without SOB or weight gain noted. Started lasix 20 mg x 5 days and improvement noted so transitioned back to lasix prn swelling. Daughter noted primarily his left ankle would swell sometimes after a day of walking. He feels well overall. He is currently on amiodarone 100 mg daily. No bleeding issues on Eliquis.   F/u in Afib clinic, 05/27/23. He currently appears to be in atrial flutter with RVR. He believes he felt his heart start racing last week. He takes amiodarone 100 mg daily. No missed doses of Eliquis 5 mg BID.   F/u in Afib clinic, 05/27/23. He is currently still in Afib. He began amiodarone 200 mg BID reload at last office visit. No missed doses of Eliquis 5 mg BID.   Today, he denies symptoms of palpitations,  shortness of breath, orthopnea, PND  dizziness, presyncope, syncope, or neurologic sequela. The patient is tolerating medications without difficulties and is otherwise without complaint today.   Past Medical History:  Diagnosis Date   Arthralgia    Benign positional vertigo    HTN (hypertension)    Hyperlipidemia    Impaired glucose tolerance    Persistent atrial fibrillation (HCC)    Senile purpura (HCC)    Past Surgical History:  Procedure Laterality Date   CARDIOVERSION N/A 03/29/2016   Procedure: CARDIOVERSION;  Surgeon: Thurmon Fair, MD;  Location: [facility];  Service: Cardiovascular;  Laterality: N/A;   CARDIOVERSION  N/A 12/22/2020   Procedure: CARDIOVERSION;  Surgeon: Thomasene Ripple, DO;  Location: [facility];  Service: Cardiovascular;  Laterality: N/A;   CATARACT EXTRACTION W/ INTRAOCULAR LENS  IMPLANT,  BILATERAL Bilateral 2017   ORIF METACARPAL FRACTURE Left ~ 2016   S/P fall   TONSILLECTOMY      Current Outpatient Medications  Medication Sig Dispense Refill   amiodarone (PACERONE) 200 MG tablet Take 1 tablet by mouth twice daily x 1 month 60 tablet 1   apixaban (ELIQUIS) 5 MG TABS tablet Take 1 tablet (5 mg total) by mouth 2 (two) times daily. 180 tablet 3   Ascorbic Acid (VITAMIN C) 1000 MG tablet Take 1,000 mg by mouth daily.     carboxymethylcellulose (REFRESH PLUS) 0.5 % SOLN Place 1 drop into both eyes as needed.     cholecalciferol (VITAMIN D3) 25 MCG (1000 UNIT) tablet Take 1,000 Units by mouth daily.     furosemide (LASIX) 20 MG tablet TAKE 1 TABLET (20 MG TOTAL) BY MOUTH DAILY AS NEEDED (SWELLING/FLUID RETENTION). 90 tablet 1   latanoprost (XALATAN) 0.005 % ophthalmic solution 1 drop as needed.     rosuvastatin (CRESTOR) 20 MG tablet Take 20 mg by mouth daily.  5   No current facility-administered medications for this encounter.    No Known Allergies  ROS- All systems are reviewed and negative except as per the HPI above  Physical Exam: Vitals:   06/17/23 1134  BP: (!) 126/90  Pulse: (!) 102  Weight: 75.3 kg  Height: 6\' 1"  (1.854 m)     Wt Readings from Last 3 Encounters:  06/17/23 75.3 kg  05/27/23 76.1 kg  10/04/22 74.7 kg    Labs: Lab Results  Component Value Date   NA 140 10/04/2022   K 3.7 10/04/2022   CL 105 10/04/2022   CO2 26 10/04/2022   GLUCOSE 151 (H) 10/04/2022   BUN 19 10/04/2022   CREATININE 1.22 10/04/2022   CALCIUM 8.6 (L) 10/04/2022   MG 2.2 11/05/2020   GEN- The patient is well appearing, alert and oriented x 3 today.   Neck - no JVD or carotid bruit noted Lungs- Clear to ausculation bilaterally, normal work of breathing Heart- Irregular rate and rhythm, no murmurs, rubs or gallops, PMI not laterally displaced Extremities- no clubbing, cyanosis, or edema Skin - no rash or ecchymosis noted   EKG-   Vent. rate 102 BPM PR  interval * ms QRS duration 166 ms QT/QTcB 408/531 ms P-R-T axes * -2 28 Atrial fibrillation with rapid ventricular response Right bundle branch block Abnormal ECG When compared with ECG of 27-May-2023 11:41, PREVIOUS ECG IS PRESENT  ECHO 12/06/2016: Study Conclusions   - Left ventricle: The cavity size was normal. Wall thickness was    increased in a pattern of mild LVH. Systolic function was normal.    The estimated ejection fraction was in the range of 50% to 55%.    Doppler parameters are consistent with abnormal left ventricular    relaxation (grade 1 diastolic dysfunction).  - Mitral valve: Moderately calcified annulus. Mildly thickened    leaflets . There was mild regurgitation. Valve area by continuity    equation (using LVOT flow): 1.45 cm^2.    Assessment and Plan: 1. Afib/flutter  Failed tikosyn, it was stopped and then pt loaded on amiodarone  He is currently in Afib. Continue reload of amiodarone 200 mg BID. He will ultimately transition to 200 mg once daily on 4/20. We discussed the procedure cardioversion to  try to convert to NSR. We discussed the risks vs benefits of this procedure and how ultimately we cannot predict whether a patient will have early return of arrhythmia post procedure. After discussion, the patient wishes to proceed with cardioversion. Labs drawn today.   Informed Consent   Shared Decision Making/Informed Consent The risks (stroke, cardiac arrhythmias rarely resulting in the need for a temporary or permanent pacemaker, skin irritation or burns and complications associated with conscious sedation including aspiration, arrhythmia, respiratory failure and death), benefits (restoration of normal sinus rhythm) and alternatives of a direct current cardioversion were explained in detail to Mr. Vick and he agrees to proceed.      High risk medication monitoring (ICD10: [id number]) Patient requires ongoing monitoring for anti-arrhythmic medication which has  the potential to cause life threatening arrhythmias or AV block. Continue amiodarone reload and transition to 200 mg once daily on 4/20.  Bloodwork for cardioversion includes Cmet and TSH for amiodarone surveillance.   2. Secondary hypercoagulable state due to Afib.  No missed doses.  3. HTN Stable today.    Follow up 3 weeks Afib clinic.    Lake Bells, PA-C [facility] [address] [place], [place] [address] [phone]

## 2023-06-17 NOTE — Addendum Note (Signed)
 Encounter addended by: Shona Simpson, RN on: 06/17/2023 12:14 PM  Actions taken: Clinical Note Signed, New alternative orders accepted, Order list changed, Diagnosis association updated

## 2023-06-19 ENCOUNTER — Other Ambulatory Visit (HOSPITAL_COMMUNITY): Payer: Self-pay | Admitting: Internal Medicine

## 2023-06-24 NOTE — Progress Notes (Signed)
 Left message for patient, Procedure scheduled for 1100, Please arrive at the hospital at 1000, NPO after midnight on           Sunday, May take meds with sips of water in the AM, please have transportation for home post procedure, and someone to stay with pt for approximately 24 hours after  Please notify us  or doctors office if any doses of Eliquis  have been missed  You may call us  at [phone]

## 2023-06-27 ENCOUNTER — Encounter (HOSPITAL_COMMUNITY): Payer: Self-pay | Admitting: Internal Medicine

## 2023-06-27 ENCOUNTER — Other Ambulatory Visit: Payer: Self-pay

## 2023-06-27 ENCOUNTER — Ambulatory Visit (HOSPITAL_COMMUNITY): Admitting: Anesthesiology

## 2023-06-27 ENCOUNTER — Encounter (HOSPITAL_COMMUNITY)
Admission: RE | Disposition: A | Discharge status: Home / Self Care | Payer: Self-pay | Source: Home / Self Care | Attending: Internal Medicine

## 2023-06-27 ENCOUNTER — Ambulatory Visit (HOSPITAL_COMMUNITY)
Admission: RE | Admit: 2023-06-27 | Discharge: 2023-06-27 | Disposition: A | Discharge status: Home / Self Care | Attending: Internal Medicine | Admitting: Internal Medicine

## 2023-06-27 DIAGNOSIS — I10 Essential (primary) hypertension: Secondary | ICD-10-CM | POA: Diagnosis not present

## 2023-06-27 DIAGNOSIS — I4891 Unspecified atrial fibrillation: Secondary | ICD-10-CM

## 2023-06-27 DIAGNOSIS — I739 Peripheral vascular disease, unspecified: Secondary | ICD-10-CM | POA: Diagnosis not present

## 2023-06-27 DIAGNOSIS — I4819 Other persistent atrial fibrillation: Secondary | ICD-10-CM | POA: Diagnosis not present

## 2023-06-27 DIAGNOSIS — Z7901 Long term (current) use of anticoagulants: Secondary | ICD-10-CM | POA: Insufficient documentation

## 2023-06-27 DIAGNOSIS — I4892 Unspecified atrial flutter: Secondary | ICD-10-CM | POA: Diagnosis not present

## 2023-06-27 DIAGNOSIS — E785 Hyperlipidemia, unspecified: Secondary | ICD-10-CM | POA: Diagnosis not present

## 2023-06-27 SURGERY — CARDIOVERSION (CATH LAB)
Anesthesia: General

## 2023-06-27 MED ORDER — SODIUM CHLORIDE 0.9% FLUSH: 3.0000 mL | Freq: Two times a day (BID) | INTRAVENOUS | Status: DC

## 2023-06-27 MED ORDER — LIDOCAINE 2% (20 MG/ML) 5 ML SYRINGE
INTRAMUSCULAR | Status: DC | PRN
  Administered 2023-06-27: 60 mg via INTRAVENOUS

## 2023-06-27 MED ORDER — PROPOFOL 10 MG/ML IV BOLUS
INTRAVENOUS | Status: DC | PRN
  Administered 2023-06-27: 20 mg via INTRAVENOUS
  Administered 2023-06-27: 30 mg via INTRAVENOUS

## 2023-06-27 MED ORDER — SODIUM CHLORIDE 0.9% FLUSH: 3.0000 mL | INTRAVENOUS | Status: DC | PRN

## 2023-06-27 SURGICAL SUPPLY — 1 items: PAD DEFIB RADIO PHYSIO CONN (PAD) ×1 IMPLANT

## 2023-06-27 NOTE — Interval H&P Note (Signed)
 History and Physical Interval Note:  06/27/2023 10:05 AM  Sean Bowman  has presented today for surgery, with the diagnosis of AFIB.  The various methods of treatment have been discussed with the patient and family. After consideration of risks, benefits and other options for treatment, the patient has consented to  Procedure(s): CARDIOVERSION (N/A) as a surgical intervention.  The patient's history has been reviewed, patient examined, no change in status, stable for surgery.  I have reviewed the patient's chart and labs.  Questions were answered to the patient's satisfaction.     Ismael Karge A Knowledge [place]

## 2023-06-27 NOTE — Anesthesia Preprocedure Evaluation (Addendum)
 Anesthesia Evaluation  Patient identified by MRN, date of birth, ID band Patient awake    Reviewed: Allergy & Precautions, NPO status , Patient's Chart, lab work & pertinent test results  Airway Mallampati: III  TM Distance: >3 FB Neck ROM: Full    Dental  (+) Dental Advisory Given, Chipped,    Pulmonary neg pulmonary ROS   Pulmonary exam normal breath sounds clear to auscultation       Cardiovascular hypertension, Pt. on medications + Peripheral Vascular Disease  Normal cardiovascular exam+ dysrhythmias Atrial Fibrillation  Rhythm:Irregular Rate:Normal  Stress Test 2021 1. Reduced counts are present in the apex that improve on stress imaging with normal wall motion consistent with apical thinning artifact.  2. Normal study without schemia or infarction.  3. Normal LVEF, 54%.  4. Brief SVT (8 beat run, likely atrial tachycardia) occurred with pharmacologic stress.  5. This is a low-risk study.     Neuro/Psych negative neurological ROS  negative psych ROS   GI/Hepatic negative GI ROS, Neg liver ROS,,,  Endo/Other  negative endocrine ROS    Renal/GU negative Renal ROS  negative genitourinary   Musculoskeletal negative musculoskeletal ROS (+)    Abdominal   Peds  Hematology negative hematology ROS (+)   Anesthesia Other Findings   Reproductive/Obstetrics                             Anesthesia Physical Anesthesia Plan  ASA: 3  Anesthesia Plan: General   Post-op Pain Management:    Induction: Intravenous  PONV Risk Score and Plan: Propofol  infusion and Treatment may vary due to age or medical condition  Airway Management Planned: Natural Airway  Additional Equipment:   Intra-op Plan:   Post-operative Plan:   Informed Consent: I have reviewed the patients History and Physical, chart, labs and discussed the procedure including the risks, benefits and alternatives for the  proposed anesthesia with the patient or authorized representative who has indicated his/her understanding and acceptance.     Dental advisory given  Plan Discussed with: CRNA  Anesthesia Plan Comments:        Anesthesia Quick Evaluation

## 2023-06-27 NOTE — Anesthesia Postprocedure Evaluation (Signed)
 Anesthesia Post Note  Patient: Sean Bowman  Procedure(s) Performed: CARDIOVERSION     Patient location during evaluation: Cath Lab Anesthesia Type: General Level of consciousness: awake and alert Pain management: pain level controlled Vital Signs Assessment: post-procedure vital signs reviewed and stable Respiratory status: spontaneous breathing, nonlabored ventilation, respiratory function stable and patient connected to nasal cannula oxygen Cardiovascular status: blood pressure returned to baseline and stable Postop Assessment: no apparent nausea or vomiting Anesthetic complications: no  No notable events documented.  Last Vitals:  Vitals:   06/27/23 1155 06/27/23 1200  BP:  125/74  Pulse: (!) 46 (!) 46  Resp: 10 10  Temp:    SpO2: 97% 98%    Last Pain:  Vitals:   06/27/23 1130  TempSrc:   PainSc: 0-No pain                 [name]

## 2023-06-27 NOTE — Transfer of Care (Signed)
 Immediate Anesthesia Transfer of Care Note  Patient: Sean Bowman  Procedure(s) Performed: CARDIOVERSION  Patient Location: PACU and Cath Lab  Anesthesia Type:General  Level of Consciousness: awake and drowsy  Airway & Oxygen Therapy: Patient Spontanous Breathing and Patient connected to nasal cannula oxygen  Post-op Assessment: Report given to RN and Post -op Vital signs reviewed and stable  Post vital signs: Reviewed and stable  Last Vitals:  Vitals Value Taken Time  BP    Temp    Pulse    Resp    SpO2      Last Pain:  Vitals:   06/27/23 1009  TempSrc:   PainSc: 0-No pain         Complications: No notable events documented.

## 2023-06-27 NOTE — Discharge Instructions (Signed)

## 2023-06-27 NOTE — CV Procedure (Signed)
 Procedure: Electrical Cardioversion Indications:  Atrial Fibrillation  Procedure Details:  Consent: Risks of procedure as well as the alternatives and risks of each were explained to the (patient/caregiver).  Consent for procedure obtained.  Time Out: Verified patient identification, verified procedure, site/side was marked, verified correct patient position, special equipment/implants available, medications/allergies/relevent history reviewed, required imaging and test results available. PERFORMED.  Patient placed on cardiac monitor, pulse oximetry, supplemental oxygen as necessary.  Sedation given:  propofol  per anesthesia Pacer pads placed anterior and posterior chest.  Cardioverted 1 time(s).  Cardioversion with synchronized biphasic 200J shock.  Evaluation: Findings: Post procedure EKG shows: NSR Complications: None Patient did tolerate procedure well.  Time Spent Directly with the Patient:  15 minutes   Sean Bowman A Tyheim Vanalstyne 06/27/2023, 11:20 AM

## 2023-07-13 ENCOUNTER — Encounter (HOSPITAL_COMMUNITY): Payer: Self-pay | Admitting: Internal Medicine

## 2023-07-13 ENCOUNTER — Ambulatory Visit (HOSPITAL_COMMUNITY)
Admission: RE | Admit: 2023-07-13 | Discharge: 2023-07-13 | Disposition: A | Discharge status: Home / Self Care | Source: Ambulatory Visit | Attending: Internal Medicine | Admitting: Internal Medicine

## 2023-07-13 VITALS — BP 134/100 | HR 91 | Ht 73.0 in | Wt 164.8 lb

## 2023-07-13 DIAGNOSIS — I4819 Other persistent atrial fibrillation: Secondary | ICD-10-CM | POA: Diagnosis not present

## 2023-07-13 DIAGNOSIS — Z79899 Other long term (current) drug therapy: Secondary | ICD-10-CM

## 2023-07-13 NOTE — Progress Notes (Signed)
 Primary Care Physician: Suan Elm, MD Referring Physician:Dr. Nunzio Belch   Sean Bowman is a 89 y.o. male with a h/o persistent afib that was diagnosed in December of 2017. He was started on anticoagulation and weeks later had DCCV which was unsuccessful. At time of cardioversion Cardizem  was doubled for better rate control and simvastatin was stopped due to drug drug interaction concerns. He was later started on rosuvastatin .  Echo showed EF of 40-45%. It was decided that he would benefit from restoring SR with tikosyn  with probable TMC. He was admitted for Tikosyn  initiation.He returned to SR and qtc was stable. He was eventually taken off Cardizem  and some ankle edema he had improved and he only takes lasix  as needed. Left on low dose BB. BB was held at one point, and had return of afib.  F/u in afib clinic, 03/16/17, for general  f/u of tikosyn . He is staying in SR. He has had all over joint discomfort including the chest, shoulders and back. He saw a specialist and is thought to have some sort of arthritis and is on prednisone which does help with symptoms. No bleeding issues with eliquis .  F/u in afib clinic 4/22,he is doing well staying in SR with Tikosyn .  He has a HR in the 40's, which is chronic but is not symptomatic. 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 No bleeding issues with eliquis  5 mg bid.  F/u in afib clinic 8/28. He feels well. He again has a HR in the upper 40's but is asymptomatic. He is taking eliquis  on a regular basis. Being compliant with dofetilide  as well.  F/u in afib clinic, 12/14. He appears to be at his baseline. No afib to report just concerned re the slow HR, from  which he does not appear to be symptomatic. We have discussed in the past trying to stop the BB and today, he would like to pursue this. Reports some light nosebleeds.   F/u in afib clinic, 6/9. He feels well. He is staying in SR. No complaints other than very mild dizziness with  position change. He is not currently  on any medication that could contribute to this.   F/u 12/12/18. He reports that he feels well. No issues with afib. Continues on Tikosyn . No issues with anticoagulation.  F/i in afib clinic 03/15/19. He has not noted any afib. Continues on tikosyn  and apixaban  5 mg bid for a CHA2DS2VASc of 3. No issues with bleeding. HR's in the 40's today that has been present in the past. He is not symptomatic nor on rate control.   F/u in afib clinic, 06/13/19.He remains in sinus brady which is  chronic and he tolerates well Has some mild LLE at end of the day that goes away by the next morning. No complaints today. Being compliant with tikosyn  and eliquis . No afib to report.   F/u afib clinic, 09/12/19. No afib to report. Has a resting HR in the 40's but when walked down the hall, the HR picked up appropriately  to the mid 60's. He is not symptomatic with the bradycardia. The bradycardia is symptomatic and he is now on any AV nodal blocking drugs. He feels well. He will soon turn 89. No issues with DOAC, takes Tikosyn  on a regular basis. He has both covid shots.    F/u in afib clinic, 12/17/19. Remains in sinus brady at 51 bpm and reports no afib awareness. He has c/o of dizziness from time to time  but is stating that is more noticeable lately. This is with sitting and standing. He is not on any AV nodal blocking drugs. His BP is elevated today, he does not monitor his HR or BP at home. I did walk him last time he was in the clinic and his HR did pick up appropriately with exercise.  Continues on eliquis , no bleeding issues, CHA2DS2VASc score of 3.   F/u in afib clinic 04/09/20. He saw Dr. Nunzio Belch in November and pt had a low risk stress test. Dr. Nunzio Belch also offered him a PPM, with his low HR's and  intermittent dizziness. Pt declined. He remains in SR with Tikosyn . EKg shows sinus brady at 50 bpm today. He has not noted much dizziness lately. He continues to  work. Qtc stable.   F/u  with afib clinic, 07/24/20. Ekg shows afib with rvr at 134 bpm.  He was unaware. He states that he came down suddenly with fatigue and head congestion 2 days ago. He denies fever, cough, shortness of breath with exertion. PO 97%.  He has been using OC decongestants.  CHA2DS2VASc score of 3. He continues on eliquis . He has not spoken to PCP re recent symptoms.   F/u  in afib clinic, 11/05/20. He did convert to SR with  BB added after last visit in May when he was asymptomatic with afib with RVR. He was sick at the time. After he converted , BB was again made prn, as he is slow in SR. Today EKG shows atrial flutter with RVR at 135 bpm. He is not really aware of being out of rhythm but feels he may have been off for the last several weeks. 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 Denies any recent sickness.he continues on tikosyn  and eliquis .   F/u in afib clinic, 11/12/20, after being found back in afib earlier in week and coming back for EKG's for rhythm check. He initially converted when metoprolol  12.5 mg bid was added. He continues in afib with RVR. I am concerned to push BB as he has a HR in the 40's in the past with low dose BB. He has been asymptomatic with bradycardia in the past and the only issue he has noted recently with the RVR is that he feels anxious in his chest at times. At his advanced age, I am concerned that he could deteriorate with RVR. He was suppose to drive to a wedding in [place]  today but I have advised against this as I am afraid he may become lightheaded behind the wheel. He agrees not to go. He still works running his own business. I discussed with Dr. Nunzio Belch and he feels that tikosyn  should be stopped and amiodarone  started. Pt is in agreement. Dr. Nunzio Belch  would prefer to defer PPM at this point for tachy/brady.   F/u in afib clinic,  11/28/20. He is back in afib clinic  being loaded on amiodarone  for failing Tikosyn  and afib with RVR. He is now on amiodarone  for around 10 days. He appears to be tolerating afib well. Ekg shows afib at 109 bpm. We discussed I will plan to cardiovert in another 2-3 weeks. No missed anticoagulation.  F/u in afib clinic, 12/10/20. He has now been on amiodarone  for one month and will schedule for cardioversion. He is tolerating afib well. He is now on amiodarone  200  mg daily. He states no missed anticoagulation.   F/u in afib clinic 10/27 after successful cardioversion and remains in SR today. He has not noted a lot of improvement but has noted less swelling in his ankles. His energy stayed good when he was in afib with RVR. He is on amiodarone  200 mg daily.   F/u in afib clinic, 11/30, he remains in SR. He feels well.Continues on amiodarone  200 mg daily.   Pt  being seen today ,04/08/21, for his PCP picking up an elevated HR on his visit last week. He did not note anything abnormal. His ekg today shows sinus brady at 45 bpm. He is not symptomatic with this. His BP is elevated today. He has just returned  from a cruise and has had tro bring in food as his wife hurt her leg and has not been able to cook.   F/u in afib clinic, 07/15/21. He is in sinus brady with PAC's. He states that he is not symptomatic.In the past, I have sent to Dr. Nunzio Belch for consideration of a PPM but he felt he was not symptomatic enough to  warrant one. If I reduce amio, he may return to afib with RVR, hence tachy/brady. We discussed placing a monitor today.   F/u in Afib clinic, 02/25/22. He remains in Sinus brady in the 40's but asymptomatic. Labs drawn for amiodarone  use. He feels well. No complaints today. He wore a monitor in June which showed SR with no triggered episodes, no unusual pauses or indication for a PPM. Since he has been in SR with the brady, no recent issues with afib, I will now try to reduce amiodarone   to 100 mg  daily. Pt's wife died in Jan 02, 2024, complications from a bowel obstruction.No symptoms of afib.   F/u  in afib clinic. He is on amiodarone  100 mg daily and HR 52 bpm. He is not symptomatic with this. He continues to work. No complaints voiced.    F/u in Afib clinic, 10/04/22. He is currently in NSR. Patient's daughter called on 7/10 noting lower extremity swelling without SOB or weight gain noted. Started lasix  20 mg x 5 days and improvement noted so transitioned back to lasix  prn swelling. Daughter noted primarily his left ankle would swell sometimes after a day of walking. He feels well overall. He is currently on amiodarone  100 mg daily. No bleeding issues on Eliquis .   F/u in Afib clinic, 05/27/23. He currently appears to be in atrial flutter with RVR. He believes he felt his heart start racing last week. He takes amiodarone  100 mg daily. No missed doses of Eliquis  5 mg BID.   F/u in Afib clinic, 05/27/23. He is currently still in Afib. He began amiodarone  200 mg BID reload at last office visit. No missed doses of Eliquis  5 mg BID.   Follow up in Afib clinic, 07/13/23. He is currently in Afib. He has had ERAF. S/p successful DCCV on 06/27/23. He is taking amiodarone  200 mg daily. No missed doses of Eliquis .   Today, he denies symptoms of palpitations,  shortness of breath, orthopnea, PND  dizziness, presyncope, syncope, or neurologic sequela. The patient is tolerating medications without difficulties and is otherwise without complaint today.   Past Medical History:  Diagnosis Date   Arthralgia    Benign positional vertigo    HTN (hypertension)    Hyperlipidemia    Impaired glucose tolerance    Persistent atrial fibrillation (HCC)    Senile purpura (HCC)  Past Surgical History:  Procedure Laterality Date   CARDIOVERSION N/A 03/29/2016   Procedure: CARDIOVERSION;  Surgeon: Luana Rumple, MD;  Location: [facility] ENDOSCOPY;  Service: Cardiovascular;  Laterality: N/A;   CARDIOVERSION N/A 12/22/2020    Procedure: CARDIOVERSION;  Surgeon: Jerryl Morin, DO;  Location: [facility] ENDOSCOPY;  Service: Cardiovascular;  Laterality: N/A;   CARDIOVERSION N/A 06/27/2023   Procedure: CARDIOVERSION;  Surgeon: Euell Herrlich, MD;  Location: [facility] INVASIVE CV LAB;  Service: Cardiovascular;  Laterality: N/A;   CATARACT EXTRACTION W/ INTRAOCULAR LENS  IMPLANT, BILATERAL Bilateral 2017   ORIF METACARPAL FRACTURE Left ~ 2016   S/P fall   TONSILLECTOMY      Current Outpatient Medications  Medication Sig Dispense Refill   amiodarone  (PACERONE ) 200 MG tablet Take 1 tablet (200 mg total) by mouth daily. 90 tablet 1   apixaban  (ELIQUIS ) 5 MG TABS tablet Take 1 tablet (5 mg total) by mouth 2 (two) times daily. 180 tablet 3   furosemide  (LASIX ) 20 MG tablet TAKE 1 TABLET (20 MG TOTAL) BY MOUTH DAILY AS NEEDED (SWELLING/FLUID RETENTION). 90 tablet 1   latanoprost (XALATAN) 0.005 % ophthalmic solution Place 1 drop into both eyes at bedtime.     rosuvastatin  (CRESTOR ) 20 MG tablet Take 20 mg by mouth daily.  5   No current facility-administered medications for this encounter.    No Known Allergies  ROS- All systems are reviewed and negative except as per the HPI above  Physical Exam: Vitals:   07/13/23 1053  BP: (!) 134/100  Pulse: 91  Weight: 74.8 kg  Height: 6\' 1"  (1.854 m)     Wt Readings from Last 3 Encounters:  07/13/23 74.8 kg  06/27/23 73 kg  06/17/23 75.3 kg    Labs: Lab Results  Component Value Date   NA 139 06/17/2023   K 4.3 06/17/2023   CL 103 06/17/2023   CO2 23 06/17/2023   GLUCOSE 105 (H) 06/17/2023   BUN 19 06/17/2023   CREATININE 1.30 (H) 06/17/2023   CALCIUM  8.9 06/17/2023   MG 2.2 11/05/2020   GEN- The patient is well appearing, alert and oriented x 3 today.   Neck - no JVD or carotid bruit noted Lungs- Clear to ausculation bilaterally, normal work of breathing Heart- Irregular rate and rhythm, no murmurs, rubs or gallops, PMI not laterally displaced Extremities- no  clubbing, cyanosis, or edema Skin - no rash or ecchymosis noted   EKG-   Vent. rate 91 BPM PR interval * ms QRS duration 170 ms QT/QTcB 424/521 ms P-R-T axes * -20 18 Atrial fibrillation Right bundle branch block Abnormal ECG When compared with ECG of 27-Jun-2023 11:26, Atrial fibrillation has replaced Sinus rhythm   ECHO 12/06/2016: Study Conclusions   - Left ventricle: The cavity size was normal. Wall thickness was    increased in a pattern of mild LVH. Systolic function was normal.    The estimated ejection fraction was in the range of 50% to 55%.    Doppler parameters are consistent with abnormal left ventricular    relaxation (grade 1 diastolic dysfunction).  - Mitral valve: Moderately calcified annulus. Mildly thickened    leaflets . There was mild regurgitation. Valve area by continuity    equation (using LVOT flow): 1.45 cm^2.    Assessment and Plan: 1. Afib/flutter  Failed tikosyn , it was stopped and then pt loaded on amiodarone  S/p successful DCCV on 06/27/23.  He is currently in Afib. Unfortunately, he has had ERAF. We discussed rate control strategy  and discontinuing amiodarone  in favor of low dose metoprolol . Patient would like to possibly try one more cardioversion before proceeding with rate control strategy. I discussed with him the low probability of success given current situation of ERAF. He would like to try one more time.    High risk medication monitoring (ICD10: [id number]) Patient requires ongoing monitoring for anti-arrhythmic medication which has the potential to cause life threatening arrhythmias or AV block. Qtc stable. Continue amiodarone  200 mg daily. Cmet and TSH drawn on 06/17/23 stable.   2. Secondary hypercoagulable state due to Afib.  No missed doses of Eliquis .  3. HTN Stable today.     Follow up 2 months Afib clinic.     Minnie Amber, PA-C Afib Clinic [facility] [address] [place], [place]  [address] [phone]

## 2023-07-18 DIAGNOSIS — I10 Essential (primary) hypertension: Secondary | ICD-10-CM | POA: Diagnosis not present

## 2023-07-27 DIAGNOSIS — K080 Exfoliation of teeth due to systemic causes: Secondary | ICD-10-CM | POA: Diagnosis not present

## 2023-08-17 IMAGING — DX DG CHEST 2V
2 series · 2 of 2 positions shown · non-contrast
Comparison: 11/29/2016

CLINICAL DATA: [AGE] male with abnormal heart rhythm

EXAM:
CHEST - 2 VIEW

[chest pa]
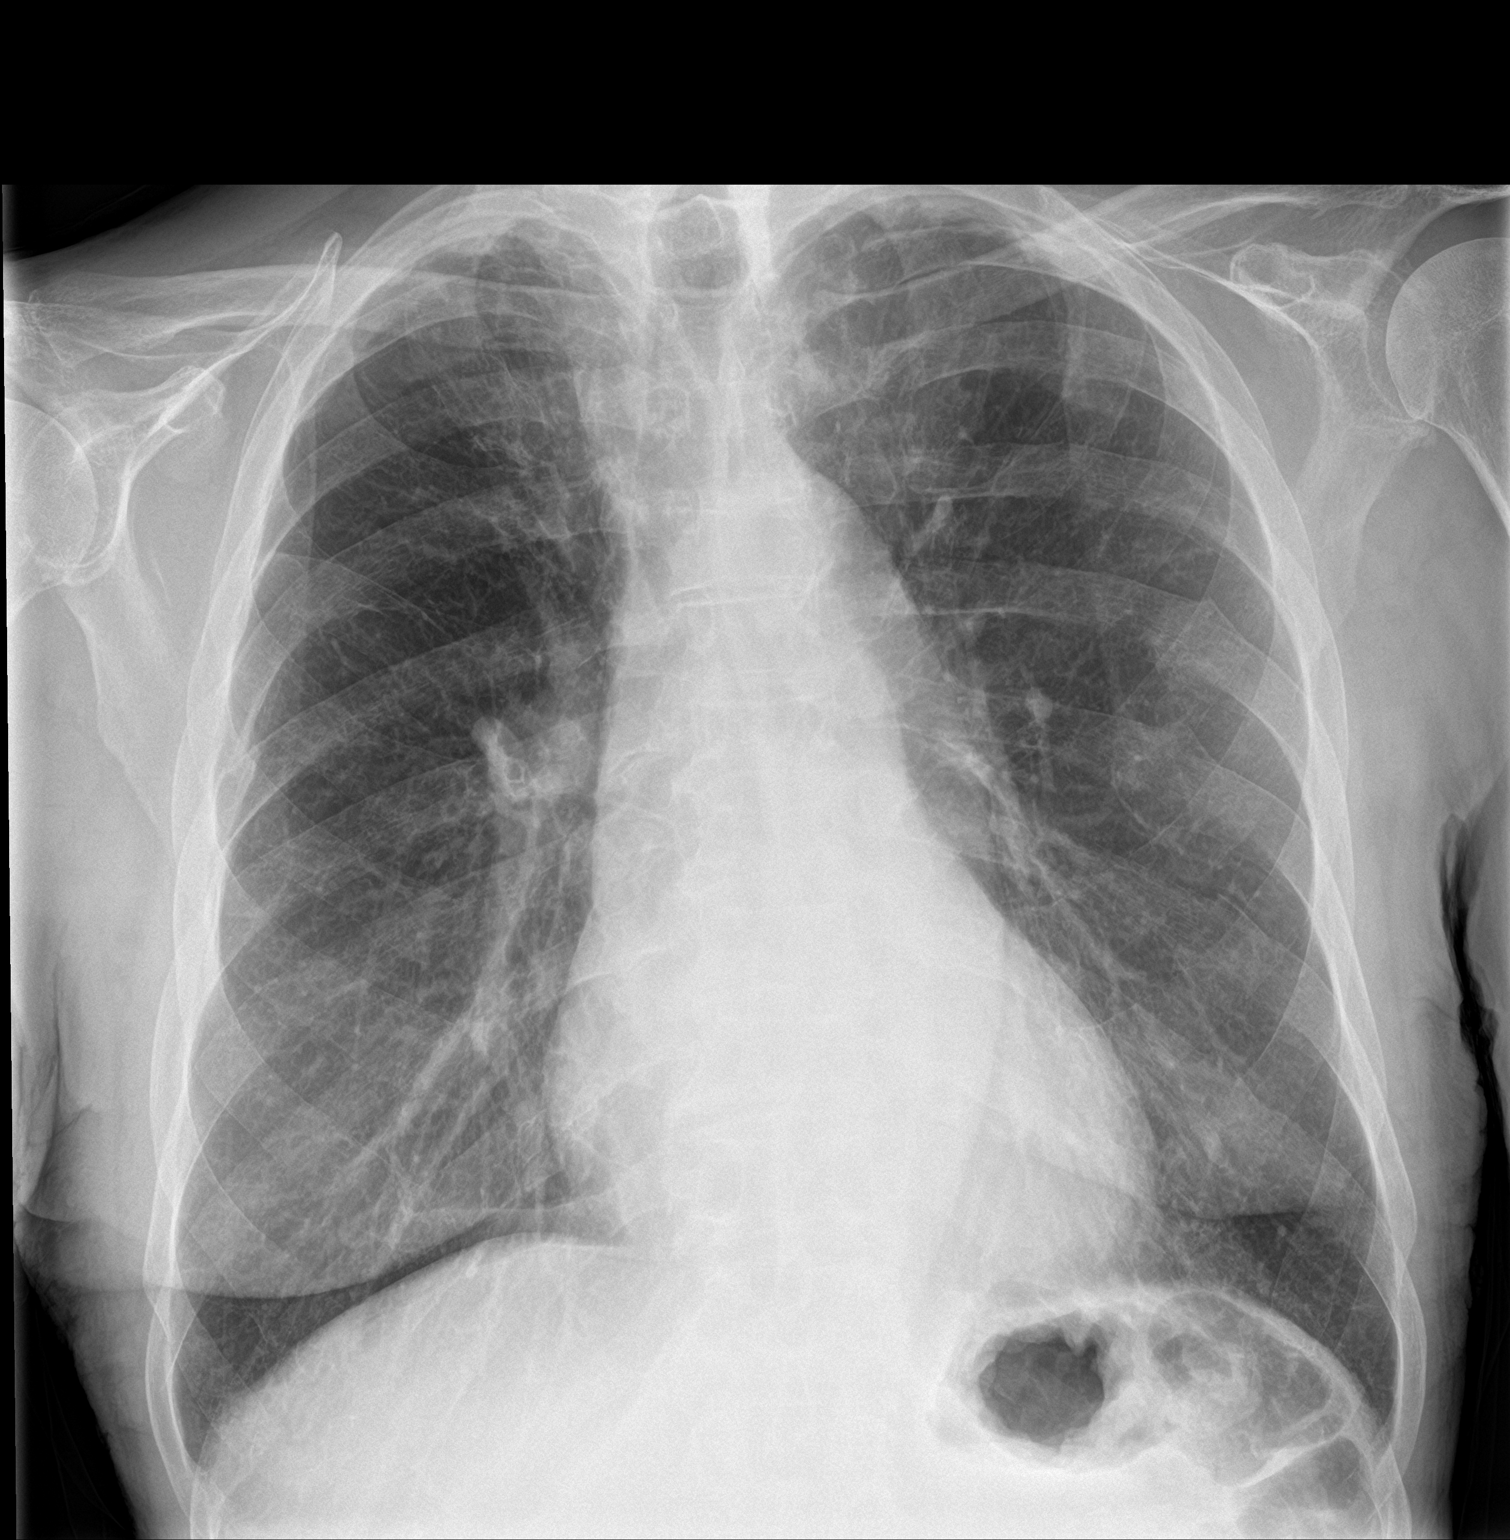

[chest lat]
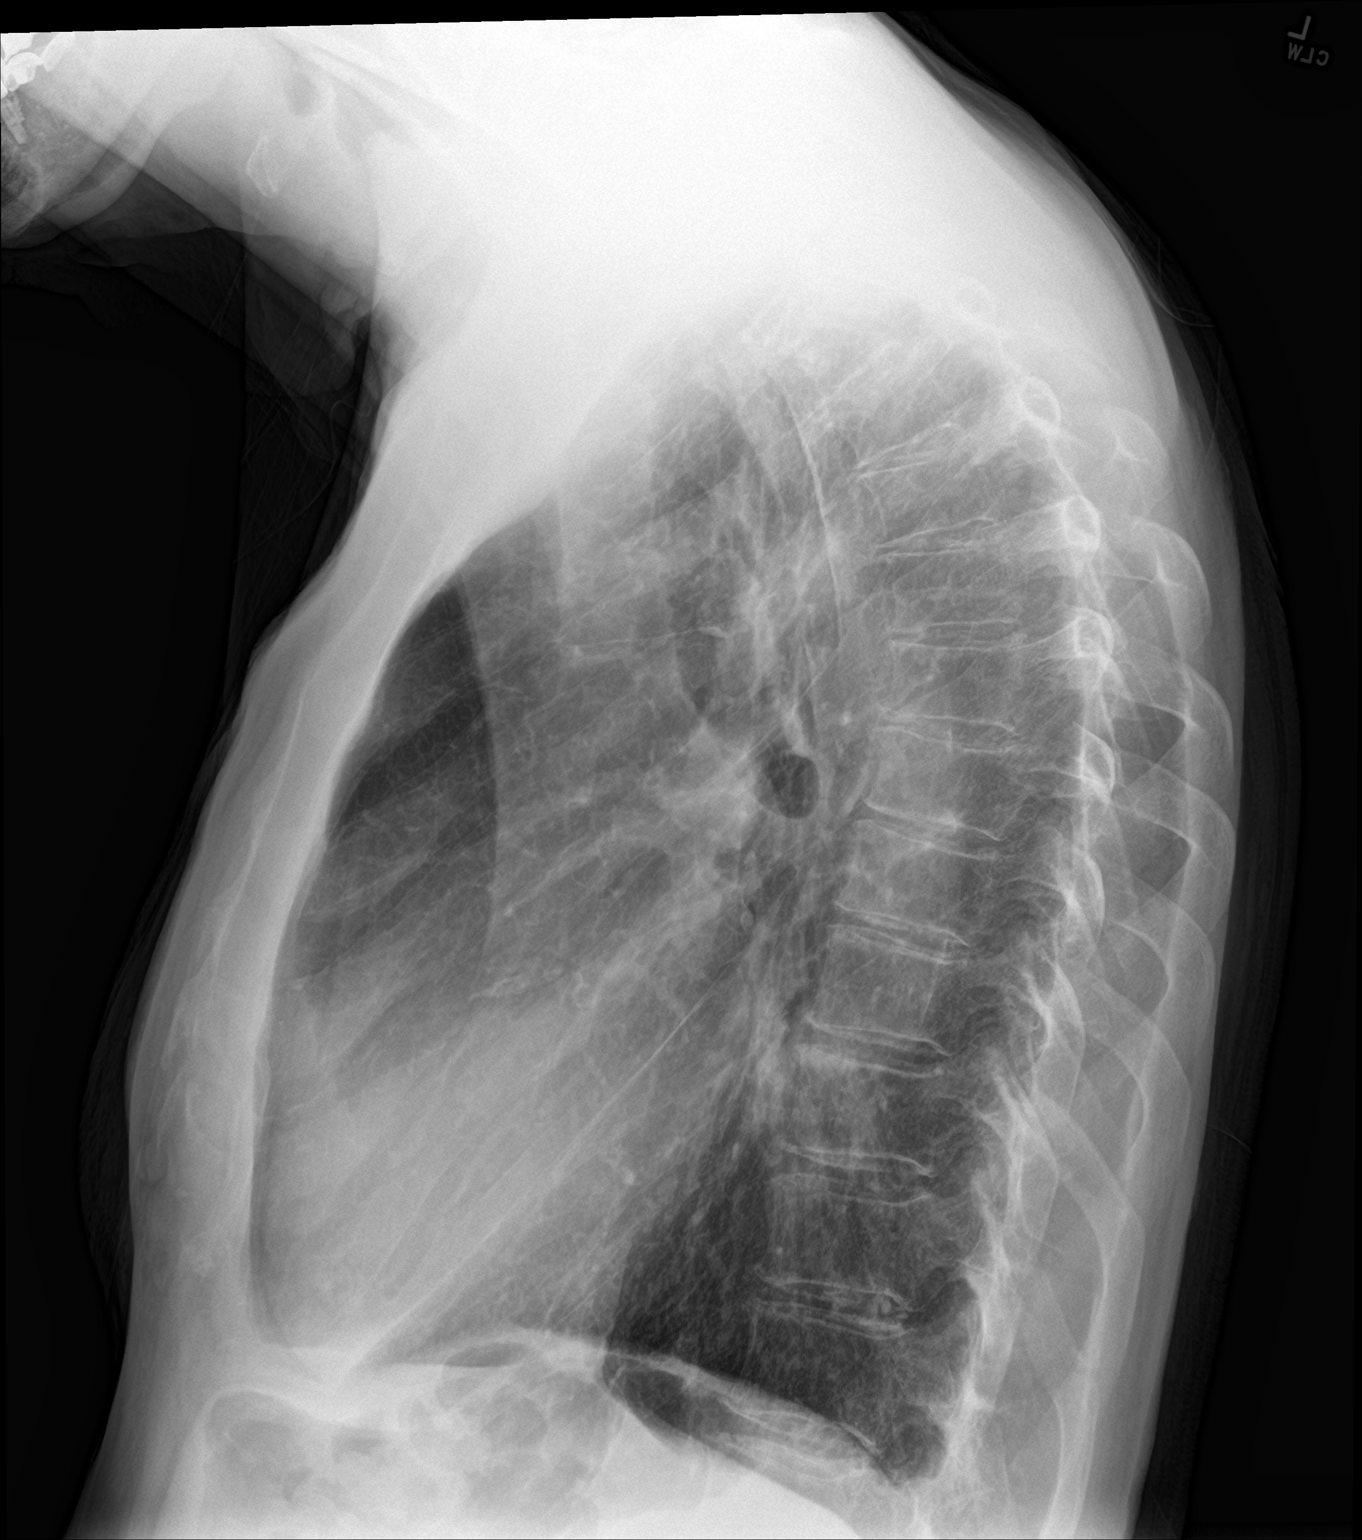

[2 of 2 positions shown; findings below may reference images not displayed]

FINDINGS: Cardiomediastinal silhouette unchanged in size and contour. No
evidence of central vascular congestion. No interlobular septal
thickening.

Stigmata of emphysema, with increased retrosternal airspace,
flattened hemidiaphragms, increased AP diameter, and hyperinflation
on the AP view.

Architectural distortion in the hilar regions with pleuroparenchymal
thickening at the apices, similar to the comparison.

No pneumothorax or pleural effusion. Coarsened interstitial
markings, with no confluent airspace disease.

No acute displaced fracture. Degenerative changes of the spine.

Partially remodeled left-sided rib fractures again demonstrated
IMPRESSION: Emphysema and chronic changes without evidence of acute
cardiopulmonary disease

## 2023-08-18 DIAGNOSIS — K080 Exfoliation of teeth due to systemic causes: Secondary | ICD-10-CM | POA: Diagnosis not present

## 2023-08-22 DIAGNOSIS — M79652 Pain in left thigh: Secondary | ICD-10-CM | POA: Diagnosis not present

## 2023-08-22 DIAGNOSIS — M25552 Pain in left hip: Secondary | ICD-10-CM | POA: Diagnosis not present

## 2023-08-22 DIAGNOSIS — S161XXA Strain of muscle, fascia and tendon at neck level, initial encounter: Secondary | ICD-10-CM | POA: Diagnosis not present

## 2023-08-24 DIAGNOSIS — H6123 Impacted cerumen, bilateral: Secondary | ICD-10-CM | POA: Diagnosis not present

## 2023-08-31 ENCOUNTER — Ambulatory Visit: Attending: Orthopedic Surgery | Admitting: Physical Therapy

## 2023-08-31 ENCOUNTER — Encounter: Payer: Self-pay | Admitting: Physical Therapy

## 2023-08-31 DIAGNOSIS — I10 Essential (primary) hypertension: Secondary | ICD-10-CM | POA: Diagnosis not present

## 2023-08-31 DIAGNOSIS — R519 Headache, unspecified: Secondary | ICD-10-CM | POA: Diagnosis not present

## 2023-08-31 DIAGNOSIS — M791 Myalgia, unspecified site: Secondary | ICD-10-CM | POA: Diagnosis not present

## 2023-08-31 DIAGNOSIS — M79652 Pain in left thigh: Secondary | ICD-10-CM | POA: Diagnosis not present

## 2023-08-31 DIAGNOSIS — I480 Paroxysmal atrial fibrillation: Secondary | ICD-10-CM | POA: Diagnosis not present

## 2023-08-31 DIAGNOSIS — R293 Abnormal posture: Secondary | ICD-10-CM | POA: Insufficient documentation

## 2023-08-31 NOTE — Therapy (Signed)
 OUTPATIENT PHYSICAL THERAPY EVALUATION   Patient Name: Sean Bowman MRN: [id number] DOB:[birth year], 89 y.o., male Today's Date: 08/31/2023  END OF SESSION:  PT End of Session - 08/31/23 1107     Visit Number 1    Date for PT Re-Evaluation 10/26/23    Authorization Type BCBS Medicare    PT Start Time 1103    PT Stop Time 1145    PT Time Calculation (min) 42 min    Activity Tolerance Patient tolerated treatment well    Behavior During Therapy WFL for tasks assessed/performed          Past Medical History:  Diagnosis Date   Arthralgia    Benign positional vertigo    HTN (hypertension)    Hyperlipidemia    Impaired glucose tolerance    Persistent atrial fibrillation (HCC)    Senile purpura (HCC)    Past Surgical History:  Procedure Laterality Date   CARDIOVERSION N/A 03/29/2016   Procedure: CARDIOVERSION;  Surgeon: Jerel Balding, MD;  Location: [facility] ENDOSCOPY;  Service: Cardiovascular;  Laterality: N/A;   CARDIOVERSION N/A 12/22/2020   Procedure: CARDIOVERSION;  Surgeon: Sheena Pugh, DO;  Location: [facility] ENDOSCOPY;  Service: Cardiovascular;  Laterality: N/A;   CARDIOVERSION N/A 06/27/2023   Procedure: CARDIOVERSION;  Surgeon: Loni Soyla LABOR, MD;  Location: [facility] INVASIVE CV LAB;  Service: Cardiovascular;  Laterality: N/A;   CATARACT EXTRACTION W/ INTRAOCULAR LENS  IMPLANT, BILATERAL Bilateral 2017   ORIF METACARPAL FRACTURE Left ~ 2016   S/P fall   TONSILLECTOMY     Patient Active Problem List   Diagnosis Date Noted   Encounter for monitoring amiodarone  therapy 10/04/2022   Hypercoagulable state due to persistent atrial fibrillation (HCC) 07/25/2020   Persistent atrial fibrillation (HCC) 05/04/2016   Essential hypertension    Hyperlipidemia 09/01/2012   Dizziness 09/01/2012    PCP: Charlie Love, MD  REFERRING PROVIDER: Celena Sharper MD   REFERRING DIAG: Rt splenius capitus, L ankle swelling , L thigh pain/claudication   THERAPY DIAG:  Pain in left  thigh  Abnormal posture  Rationale for Evaluation and Treatment: Rehabilitation  ONSET DATE: 3 mos   SUBJECTIVE:   SUBJECTIVE STATEMENT: This has been going on about 3 mos.  It feel like a taut band in L thigh. I've always had a little swelling in L ankle but no pain.  The slight pain has decreased with time.  Recalls no trauma to his thigh.  He is unsure of what makes it worse.  I am a whole lot better now than I was 3 weeks ago. Denies weakness or sensory. I'm more guarded. Denies neck pain or balance issues.   He reports he was at the MD this AM because of a headache in the back of his head.   PERTINENT HISTORY: Atrial fibrillation PAIN:  Are you having pain? Yes: NPRS scale: none  Pain location: NA Pain description: L thigh pain no longer an issue Aggravating factors: not sure  Relieving factors: time  PRECAUTIONS: None  RED FLAGS: None   WEIGHT BEARING RESTRICTIONS: No  FALLS:  Has patient fallen in last 6 months? No  LIVING ENVIRONMENT: Lives with: lives alone Lives in: House/apartment Stairs: Yes: Internal: no difficulty  steps; on right going up Has following equipment at home: None no longer having this difficulty   OCCUPATION: retired, was a Careers adviser, works in the yard  PLOF: Independent  PATIENT GOALS: not sure   NEXT MD VISIT: workup in progress for L thigh   OBJECTIVE:  Note: Objective measures were completed at Evaluation unless otherwise noted.  DIAGNOSTIC FINDINGS: not available   PATIENT SURVEYS:  NT on eval   COGNITION: Overall cognitive status: Within functional limits for tasks assessed     SENSATION: WFL  EDEMA:  Circumferential: very min increase in swelling on L ankle, not measured    POSTURE: rounded shoulders, forward head, increased thoracic kyphosis, flexed trunk , and Low Rt shoulder   PALPATION: No pain with palpation to L thigh, lateral, ITB is taut but appropriately  CERVICAL ROM:   Active ROM  A/PROM (deg) 08/31/2023  Flexion 55  Extension Lacks extension, able to extend 10 deg within a flexed position   Right lateral flexion 15  Left lateral flexion 15  Right rotation 30  Left rotation 30   (Blank rows = not tested)   UE MMT:   MMT Right 08/31/2023 Left 08/31/2023  Shoulder flexion    Shoulder extension    Shoulder abduction    Shoulder adduction    Shoulder extension    Shoulder internal rotation    Shoulder external rotation     FUNCTIONAL TESTS:  5 times sit to stand: 11 sec   LOWER EXTREMITY ROM: WNL    LOWER EXTREMITY MMT:  MMT Right eval Left eval  Hip flexion 4+ 4  Hip extension    Hip abduction    Hip adduction    Hip internal rotation    Hip external rotation    Knee flexion 5 5  Knee extension 5 5  Ankle dorsiflexion 4 4  Ankle plantarflexion    Ankle inversion    Ankle eversion     (Blank rows = not tested)  LOWER EXTREMITY SPECIAL TESTS:  Neg Scour test , no pain with quad stretching    GAIT: Distance walked: 150 Assistive device utilized: None Level of assistance: Modified independence Comments: reports he more cautious with his steps due to LLE                                                                                                                                 TREATMENT DATE:  [facility] Adult PT Treatment:                                                DATE: 08/31/23 Therapeutic Exercise: Corner stretch Cervical rotation  Row BTB  Extension BTB Chin tuck  Self Care: Posture, rationale for exercises, POC   PATIENT EDUCATION:  Education details: see above  Person educated: Patient Education method: Explanation, Demonstration, and Handouts Education comprehension: verbalized understanding, returned demonstration, and needs further education  HOME EXERCISE PROGRAM: Access Code: [email or web address] URL: [email or web address] Date: 08/31/2023 Prepared by: Delon Norma  Exercises - Corner Pec Major Stretch   - 1 x daily - 7 x weekly - 1  sets - 3 reps - 30 hold - Standing Shoulder Row with Anchored Resistance  - 1 x daily - 7 x weekly - 2 sets - 10 reps - 5 hold - Shoulder extension with resistance - Neutral  - 1 x daily - 7 x weekly - 2 sets - 10 reps - 5 hold - Seated Scapular Retraction  - 1 x daily - 7 x weekly - 2 sets - 10 reps - 5 hold - Seated Cervical Retraction and Rotation  - 1 x daily - 7 x weekly - 2 sets - 10 reps - 10 hold  ASSESSMENT:  CLINICAL IMPRESSION: Patient is a 89 y.o. male who was seen today for physical therapy evaluation and treatment for L thigh pain, abnormal posture.   OBJECTIVE IMPAIRMENTS: impaired flexibility and postural dysfunction.   ACTIVITY LIMITATIONS: reports no limitations other than general mobility with respect to L LE  PARTICIPATION LIMITATIONS: community activity and yard work  PERSONAL FACTORS: Age and 1 comorbidity: A-fib are also affecting patient's functional outcome.   REHAB POTENTIAL: Excellent  CLINICAL DECISION MAKING: Stable/uncomplicated  EVALUATION COMPLEXITY: Low   GOALS:    LONG TERM GOALS: Target date: 09/28/2023    Patient will be independent with final HEP upon discharge from PT and report consistent benefit following exercise completion.    Baseline:  Goal status: INITIAL  2.  Patient will be able to demonstrate proper posture and lifting techniques related to spine health and reduction of symptoms.   Baseline:  Goal status: INITIAL  3. Further goals related to LLE TBA if patient returns.    Baseline:  Goal status: INITIAL  PLAN:  PT FREQUENCY: one time visit  PT DURATION: other: 1 x visit for HEP Will hold patient chart OPEN in case more PT needed for LLE   PLANNED INTERVENTIONS: [id number]- PT Re-evaluation, 97750- Physical Performance Testing, 97110-Therapeutic exercises, 97530- Therapeutic activity, 97112- Neuromuscular re-education, 97535- Self Care, [id number]- Manual therapy, [phone]- Gait training, Patient/Family  education, Balance training, Spinal mobilization, Cryotherapy, and Moist heat  PLAN FOR NEXT SESSION: if he returns, further assessment of LLE , posture and general mobility training    Fiorella Hanahan, PT 08/31/2023, 12:07 PM   Delon Norma, PT 08/31/23 12:23 PM Phone: [phone] Fax: [phone]

## 2023-09-01 ENCOUNTER — Other Ambulatory Visit: Payer: Self-pay | Admitting: Internal Medicine

## 2023-09-01 DIAGNOSIS — R519 Headache, unspecified: Secondary | ICD-10-CM

## 2023-09-05 DIAGNOSIS — K080 Exfoliation of teeth due to systemic causes: Secondary | ICD-10-CM | POA: Diagnosis not present

## 2023-09-12 ENCOUNTER — Ambulatory Visit (HOSPITAL_COMMUNITY): Payer: Self-pay | Admitting: Internal Medicine

## 2023-09-12 ENCOUNTER — Ambulatory Visit (HOSPITAL_COMMUNITY)
Admission: RE | Admit: 2023-09-12 | Discharge: 2023-09-12 | Disposition: A | Discharge status: Home / Self Care | Source: Ambulatory Visit | Attending: Internal Medicine | Admitting: Internal Medicine

## 2023-09-12 VITALS — BP 136/76 | HR 92 | Ht 73.0 in | Wt 162.4 lb

## 2023-09-12 DIAGNOSIS — D6869 Other thrombophilia: Secondary | ICD-10-CM | POA: Diagnosis not present

## 2023-09-12 DIAGNOSIS — Z5181 Encounter for therapeutic drug level monitoring: Secondary | ICD-10-CM

## 2023-09-12 DIAGNOSIS — I4891 Unspecified atrial fibrillation: Secondary | ICD-10-CM | POA: Diagnosis not present

## 2023-09-12 DIAGNOSIS — I4819 Other persistent atrial fibrillation: Secondary | ICD-10-CM | POA: Diagnosis not present

## 2023-09-12 DIAGNOSIS — Z79899 Other long term (current) drug therapy: Secondary | ICD-10-CM

## 2023-09-12 LAB — CBC
Hematocrit: 36.4 % — ABNORMAL LOW (ref 37.5–51.0)
Hemoglobin: 12.3 g/dL — ABNORMAL LOW (ref 13.0–17.7)
MCH: 33.6 pg — ABNORMAL HIGH (ref 26.6–33.0)
MCHC: 33.8 g/dL (ref 31.5–35.7)
MCV: 100 fL — ABNORMAL HIGH (ref 79–97)
Platelets: 155 x10E3/uL (ref 150–450)
RBC: 3.66 x10E6/uL — ABNORMAL LOW (ref 4.14–5.80)
RDW: 14.3 % (ref 11.6–15.4)
WBC: 6.2 x10E3/uL (ref 3.4–10.8)

## 2023-09-12 LAB — BASIC METABOLIC PANEL WITH GFR
BUN/Creatinine Ratio: 16 (ref 10–24)
BUN: 20 mg/dL (ref 10–36)
CO2: 25 mmol/L (ref 20–29)
Calcium: 9.1 mg/dL (ref 8.6–10.2)
Chloride: 105 mmol/L (ref 96–106)
Creatinine, Ser: 1.26 mg/dL (ref 0.76–1.27)
Glucose: 86 mg/dL (ref 70–99)
Potassium: 4.5 mmol/L (ref 3.5–5.2)
Sodium: 140 mmol/L (ref 134–144)
eGFR: 53 mL/min/1.73 — ABNORMAL LOW (ref >59)

## 2023-09-12 MED ORDER — AMIODARONE HCL 200 MG PO TABS: ORAL_TABLET | ORAL | 1 refills | Status: DC

## 2023-09-12 NOTE — Patient Instructions (Signed)
 Amiodarone  200mg  twice a day until August 5th then reduce back to 200mg  once a day     Cardioversion scheduled for: Monday, August 4th   - Arrive at the [facility] ([address])  and check in with ADMITTING at 8:00AM   - Do not eat or drink anything after midnight the night prior to your procedure.   - Take all your morning medication (except diabetic medications) with a sip of water prior to arrival.  - Do NOT miss any doses of your blood thinner - if you should miss a dose or take a dose more than 4 hours late -- please notify our office immediately.  - You will not be able to drive home after your procedure. Please ensure you have a responsible adult to drive you home. You will need someone with you for 24 hours post procedure.     - Expect to be in the procedural area approximately 2 hours.   - If you feel as if you go back into normal rhythm prior to scheduled cardioversion, please notify our office immediately.   If your procedure is canceled in the cardioversion suite you will be charged a cancellation fee.

## 2023-09-12 NOTE — Progress Notes (Addendum)
 Primary Care Physician: Shepard Ade, MD Referring Physician:Dr. Kelsie   Sean Bowman is a 89 y.o. male with a h/o persistent afib that was diagnosed in December of 2017. He was started on anticoagulation and weeks later had DCCV which was unsuccessful. At time of cardioversion Cardizem  was doubled for better rate control and simvastatin was stopped due to drug drug interaction concerns. He was later started on rosuvastatin .  Echo showed EF of 40-45%. It was decided that he would benefit from restoring SR with tikosyn  with probable TMC. He was admitted for Tikosyn  initiation.He returned to SR and qtc was stable. He was eventually taken off Cardizem  and some ankle edema he had improved and he only takes lasix  as needed. Left on low dose BB. BB was held at one point, and had return of afib.  F/u in afib clinic, 03/16/17, for general  f/u of tikosyn . He is staying in SR. He has had all over joint discomfort including the chest, shoulders and back. He saw a specialist and is thought to have some sort of arthritis and is on prednisone which does help with symptoms. No bleeding issues with eliquis .  F/u in afib clinic 4/22,he is doing well staying in SR with Tikosyn .  He has a HR in the 40's, which is chronic but is not symptomatic. 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 No bleeding issues with eliquis  5 mg bid.  F/u in afib clinic 8/28. He feels well. He again has a HR in the upper 40's but is asymptomatic. He is taking eliquis  on a regular basis. Being compliant with dofetilide  as well.  F/u in afib clinic, 12/14. He appears to be at his baseline. No afib to report just concerned re the slow HR, from  which he does not appear to be symptomatic. We have discussed in the past trying to stop the BB and today, he would like to pursue this. Reports some light nosebleeds.   F/u in afib clinic, 6/9. He feels well. He is staying in SR. No complaints other than very mild dizziness with  position change. He is not currently  on any medication that could contribute to this.   F/u 12/12/18. He reports that he feels well. No issues with afib. Continues on Tikosyn . No issues with anticoagulation.  F/i in afib clinic 03/15/19. He has not noted any afib. Continues on tikosyn  and apixaban  5 mg bid for a CHA2DS2VASc of 3. No issues with bleeding. HR's in the 40's today that has been present in the past. He is not symptomatic nor on rate control.   F/u in afib clinic, 06/13/19.He remains in sinus brady which is  chronic and he tolerates well Has some mild LLE at end of the day that goes away by the next morning. No complaints today. Being compliant with tikosyn  and eliquis . No afib to report.   F/u afib clinic, 09/12/19. No afib to report. Has a resting HR in the 40's but when walked down the hall, the HR picked up appropriately  to the mid 60's. He is not symptomatic with the bradycardia. The bradycardia is symptomatic and he is now on any AV nodal blocking drugs. He feels well. He will soon turn 89. No issues with DOAC, takes Tikosyn  on a regular basis. He has both covid shots.    F/u in afib clinic, 12/17/19. Remains in sinus brady at 51 bpm and reports no afib awareness. He has c/o of dizziness from time to time  but is stating that is more noticeable lately. This is with sitting and standing. He is not on any AV nodal blocking drugs. His BP is elevated today, he does not monitor his HR or BP at home. I did walk him last time he was in the clinic and his HR did pick up appropriately with exercise.  Continues on eliquis , no bleeding issues, CHA2DS2VASc score of 3.   F/u in afib clinic 04/09/20. He saw Dr. Kelsie in November and pt had a low risk stress test. Dr. Kelsie also offered him a PPM, with his low HR's and  intermittent dizziness. Pt declined. He remains in SR with Tikosyn . EKg shows sinus brady at 50 bpm today. He has not noted much dizziness lately. He continues to  work. Qtc stable.   F/u  with afib clinic, 07/24/20. Ekg shows afib with rvr at 134 bpm.  He was unaware. He states that he came down suddenly with fatigue and head congestion 2 days ago. He denies fever, cough, shortness of breath with exertion. PO 97%.  He has been using OC decongestants.  CHA2DS2VASc score of 3. He continues on eliquis . He has not spoken to PCP re recent symptoms.   F/u  in afib clinic, 11/05/20. He did convert to SR with  BB added after last visit in May when he was asymptomatic with afib with RVR. He was sick at the time. After he converted , BB was again made prn, as he is slow in SR. Today EKG shows atrial flutter with RVR at 135 bpm. He is not really aware of being out of rhythm but feels he may have been off for the last several weeks. 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 Denies any recent sickness.he continues on tikosyn  and eliquis .   F/u in afib clinic, 11/12/20, after being found back in afib earlier in week and coming back for EKG's for rhythm check. He initially converted when metoprolol  12.5 mg bid was added. He continues in afib with RVR. I am concerned to push BB as he has a HR in the 40's in the past with low dose BB. He has been asymptomatic with bradycardia in the past and the only issue he has noted recently with the RVR is that he feels anxious in his chest at times. At his advanced age, I am concerned that he could deteriorate with RVR. He was suppose to drive to a wedding in [place]  today but I have advised against this as I am afraid he may become lightheaded behind the wheel. He agrees not to go. He still works running his own business. I discussed with Dr. Kelsie and he feels that tikosyn  should be stopped and amiodarone  started. Pt is in agreement. Dr. Kelsie  would prefer to defer PPM at this point for tachy/brady.   F/u in afib clinic,  11/28/20. He is back in afib clinic  being loaded on amiodarone  for failing Tikosyn  and afib with RVR. He is now on amiodarone  for around 10 days. He appears to be tolerating afib well. Ekg shows afib at 109 bpm. We discussed I will plan to cardiovert in another 2-3 weeks. No missed anticoagulation.  F/u in afib clinic, 12/10/20. He has now been on amiodarone  for one month and will schedule for cardioversion. He is tolerating afib well. He is now on amiodarone  200  mg daily. He states no missed anticoagulation.   F/u in afib clinic 10/27 after successful cardioversion and remains in SR today. He has not noted a lot of improvement but has noted less swelling in his ankles. His energy stayed good when he was in afib with RVR. He is on amiodarone  200 mg daily.   F/u in afib clinic, 11/30, he remains in SR. He feels well.Continues on amiodarone  200 mg daily.   Pt  being seen today ,04/08/21, for his PCP picking up an elevated HR on his visit last week. He did not note anything abnormal. His ekg today shows sinus brady at 45 bpm. He is not symptomatic with this. His BP is elevated today. He has just returned  from a cruise and has had tro bring in food as his wife hurt her leg and has not been able to cook.   F/u in afib clinic, 07/15/21. He is in sinus brady with PAC's. He states that he is not symptomatic.In the past, I have sent to Dr. Kelsie for consideration of a PPM but he felt he was not symptomatic enough to  warrant one. If I reduce amio, he may return to afib with RVR, hence tachy/brady. We discussed placing a monitor today.   F/u in Afib clinic, 02/25/22. He remains in Sinus brady in the 40's but asymptomatic. Labs drawn for amiodarone  use. He feels well. No complaints today. He wore a monitor in June which showed SR with no triggered episodes, no unusual pauses or indication for a PPM. Since he has been in SR with the brady, no recent issues with afib, I will now try to reduce amiodarone   to 100 mg  daily. Pt's wife died in 12-25-23, complications from a bowel obstruction.No symptoms of afib.   F/u  in afib clinic. He is on amiodarone  100 mg daily and HR 52 bpm. He is not symptomatic with this. He continues to work. No complaints voiced.    F/u in Afib clinic, 10/04/22. He is currently in NSR. Patient's daughter called on 7/10 noting lower extremity swelling without SOB or weight gain noted. Started lasix  20 mg x 5 days and improvement noted so transitioned back to lasix  prn swelling. Daughter noted primarily his left ankle would swell sometimes after a day of walking. He feels well overall. He is currently on amiodarone  100 mg daily. No bleeding issues on Eliquis .   F/u in Afib clinic, 05/27/23. He currently appears to be in atrial flutter with RVR. He believes he felt his heart start racing last week. He takes amiodarone  100 mg daily. No missed doses of Eliquis  5 mg BID.   F/u in Afib clinic, 05/27/23. He is currently still in Afib. He began amiodarone  200 mg BID reload at last office visit. No missed doses of Eliquis  5 mg BID.   Follow up in Afib clinic, 07/13/23. He is currently in Afib. He has had ERAF. S/p successful DCCV on 06/27/23. He is taking amiodarone  200 mg daily. No missed doses of Eliquis .   On follow up 09/12/23, patient is currently in Afib. He would like to try another cardioversion. He feels tired when out of rhythm. No missed doses of Eliquis .   Today, he denies symptoms of palpitations,  shortness of breath, orthopnea, PND  dizziness, presyncope, syncope, or neurologic sequela. The patient is tolerating medications without difficulties and is otherwise without complaint today.   Past Medical History:  Diagnosis Date   Arthralgia    Benign positional vertigo  HTN (hypertension)    Hyperlipidemia    Impaired glucose tolerance    Persistent atrial fibrillation (HCC)    Senile purpura (HCC)    Past Surgical History:  Procedure Laterality Date   CARDIOVERSION N/A 03/29/2016    Procedure: CARDIOVERSION;  Surgeon: Jerel Balding, MD;  Location: [facility];  Service: Cardiovascular;  Laterality: N/A;   CARDIOVERSION N/A 12/22/2020   Procedure: CARDIOVERSION;  Surgeon: Sheena Pugh, DO;  Location: [facility];  Service: Cardiovascular;  Laterality: N/A;   CARDIOVERSION N/A 06/27/2023   Procedure: CARDIOVERSION;  Surgeon: Loni Soyla LABOR, MD;  Location: [facility] CV [facility];  Service: Cardiovascular;  Laterality: N/A;   CATARACT EXTRACTION W/ INTRAOCULAR LENS  IMPLANT, BILATERAL Bilateral 2017   ORIF METACARPAL FRACTURE Left ~ 2016   S/P fall   TONSILLECTOMY      Current Outpatient Medications  Medication Sig Dispense Refill   amiodarone  (PACERONE ) 200 MG tablet Take 1 tablet (200 mg total) by mouth daily. 90 tablet 1   apixaban  (ELIQUIS ) 5 MG TABS tablet Take 1 tablet (5 mg total) by mouth 2 (two) times daily. 180 tablet 3   furosemide  (LASIX ) 20 MG tablet TAKE 1 TABLET (20 MG TOTAL) BY MOUTH DAILY AS NEEDED (SWELLING/FLUID RETENTION). (Patient taking differently: Take 20 mg by mouth as needed (swelling/fluid retention).) 90 tablet 1   latanoprost (XALATAN) 0.005 % ophthalmic solution Place 1 drop into both eyes at bedtime.     rosuvastatin  (CRESTOR ) 20 MG tablet Take 20 mg by mouth daily.  5   No current facility-administered medications for this encounter.    No Known Allergies  ROS- All systems are reviewed and negative except as per the HPI above  Physical Exam: Vitals:   09/12/23 1101  BP: 136/76  Pulse: 92  Weight: 73.7 kg  Height: 6' 1 (1.854 m)      Wt Readings from Last 3 Encounters:  09/12/23 73.7 kg  07/13/23 74.8 kg  06/27/23 73 kg    Labs: Lab Results  Component Value Date   NA 139 06/17/2023   K 4.3 06/17/2023   CL 103 06/17/2023   CO2 23 06/17/2023   GLUCOSE 105 (H) 06/17/2023   BUN 19 06/17/2023   CREATININE 1.30 (H) 06/17/2023   CALCIUM  8.9 06/17/2023   MG 2.2 11/05/2020   GEN- The patient is well appearing, alert  and oriented x 3 today.   Neck - no JVD or carotid bruit noted Lungs- Clear to ausculation bilaterally, normal work of breathing Heart- Irregular rate and rhythm, no murmurs, rubs or gallops, PMI not laterally displaced Extremities- no clubbing, cyanosis, or edema Skin - no rash or ecchymosis noted   EKG-   Vent. rate 92 BPM PR interval * ms QRS duration 168 ms QT/QTcB 414/511 ms P-R-T axes * 20 29 Atrial fibrillation Right bundle branch block Abnormal ECG When compared with ECG of 13-Jul-2023 10:56, No significant change was found   ECHO 12/06/2016: Study Conclusions   - Left ventricle: The cavity size was normal. Wall thickness was    increased in a pattern of mild LVH. Systolic function was normal.    The estimated ejection fraction was in the range of 50% to 55%.    Doppler parameters are consistent with abnormal left ventricular    relaxation (grade 1 diastolic dysfunction).  - Mitral valve: Moderately calcified annulus. Mildly thickened    leaflets . There was mild regurgitation. Valve area by continuity    equation (using LVOT flow): 1.45 cm^2.  Assessment and Plan: 1. Persistent Afib/flutter  Failed tikosyn , it was stopped and then pt loaded on amiodarone  S/p successful DCCV on 06/27/23.  He is currently in Afib. Patient would like to try another cardioversion. We discussed the procedure cardioversion to try to convert to NSR. We discussed the risks vs benefits of this procedure and how ultimately we cannot predict whether a patient will have early return of arrhythmia post procedure. After discussion, the patient wishes to proceed with cardioversion. Will reload amiodarone  and schedule cardioversion after reload. Labs drawn today.   Informed Consent   Shared Decision Making/Informed Consent The risks (stroke, cardiac arrhythmias rarely resulting in the need for a temporary or permanent pacemaker, skin irritation or burns and complications associated with conscious  sedation including aspiration, arrhythmia, respiratory failure and death), benefits (restoration of normal sinus rhythm) and alternatives of a direct current cardioversion were explained in detail to Mr. Gawron and he agrees to proceed.      High risk medication monitoring (ICD10: [id number]) Patient requires ongoing monitoring for anti-arrhythmic medication which has the potential to cause life threatening arrhythmias or AV block. Qtc stable. Increase amiodarone  to 200 mg BID x 4 weeks and then transition back to 200 mg once daily.    2. Secondary hypercoagulable state due to Afib.  No missed doses of Eliquis .  3. HTN Stable today.    Follow up 2 weeks after DCCV.    Fairy Heinrich, PA-C Af[facility] [address] [place], [place] [address] [phone]

## 2023-09-27 DIAGNOSIS — K080 Exfoliation of teeth due to systemic causes: Secondary | ICD-10-CM | POA: Diagnosis not present

## 2023-09-30 ENCOUNTER — Encounter: Payer: Self-pay | Admitting: Internal Medicine

## 2023-10-05 DIAGNOSIS — K080 Exfoliation of teeth due to systemic causes: Secondary | ICD-10-CM | POA: Diagnosis not present

## 2023-10-07 NOTE — Progress Notes (Signed)
 Pt called for pre procedure instructions.  Instructions given to daughters. Arrival time 0745 NPO after midnight explained Instructed to take am meds with sip of water and confirmed blood thinner consistency. Eliquis . Instructed pt need for ride home tomorrow and have responsible adult with them for 24 hrs post procedure.

## 2023-10-10 ENCOUNTER — Ambulatory Visit (HOSPITAL_COMMUNITY)
Admission: RE | Admit: 2023-10-10 | Discharge: 2023-10-10 | Disposition: A | Discharge status: Home / Self Care | Attending: Cardiology | Admitting: Cardiology

## 2023-10-10 DIAGNOSIS — Z538 Procedure and treatment not carried out for other reasons: Secondary | ICD-10-CM | POA: Diagnosis not present

## 2023-10-10 DIAGNOSIS — I4819 Other persistent atrial fibrillation: Secondary | ICD-10-CM | POA: Insufficient documentation

## 2023-10-10 DIAGNOSIS — I480 Paroxysmal atrial fibrillation: Secondary | ICD-10-CM

## 2023-10-10 SURGERY — CARDIOVERSION (CATH LAB)
Anesthesia: Monitor Anesthesia Care

## 2023-10-10 NOTE — Progress Notes (Signed)
 Patient presented in sinus bradycardia, confirmed with ECG. Rates in the upper 40s. Patient asymptomatic. He is on amiodarone  200 mg BID. Will contact afib clinic given his bradycardia to see if they wish to change his amiodarone  dosing.  Shelda Bruckner, MD, PhD, [facility]  [facility]  [place]  [facility] at [facility] [address] [place], [place] [address] [phone]

## 2023-10-24 ENCOUNTER — Other Ambulatory Visit

## 2023-10-26 DIAGNOSIS — K080 Exfoliation of teeth due to systemic causes: Secondary | ICD-10-CM | POA: Diagnosis not present

## 2023-10-27 ENCOUNTER — Ambulatory Visit (HOSPITAL_COMMUNITY): Admitting: Internal Medicine

## 2023-10-31 ENCOUNTER — Other Ambulatory Visit (HOSPITAL_COMMUNITY): Payer: Self-pay | Admitting: Internal Medicine

## 2023-10-31 DIAGNOSIS — E785 Hyperlipidemia, unspecified: Secondary | ICD-10-CM | POA: Diagnosis not present

## 2023-10-31 DIAGNOSIS — Z0189 Encounter for other specified special examinations: Secondary | ICD-10-CM | POA: Diagnosis not present

## 2023-11-02 DIAGNOSIS — Z1339 Encounter for screening examination for other mental health and behavioral disorders: Secondary | ICD-10-CM | POA: Diagnosis not present

## 2023-11-02 DIAGNOSIS — Z23 Encounter for immunization: Secondary | ICD-10-CM | POA: Diagnosis not present

## 2023-11-02 DIAGNOSIS — Z1331 Encounter for screening for depression: Secondary | ICD-10-CM | POA: Diagnosis not present

## 2023-11-02 DIAGNOSIS — Z0000 Encounter for general adult medical examination without abnormal findings: Secondary | ICD-10-CM | POA: Diagnosis not present

## 2023-11-02 DIAGNOSIS — R82998 Other abnormal findings in urine: Secondary | ICD-10-CM | POA: Diagnosis not present

## 2023-11-02 DIAGNOSIS — I4891 Unspecified atrial fibrillation: Secondary | ICD-10-CM | POA: Diagnosis not present

## 2023-11-03 ENCOUNTER — Ambulatory Visit (HOSPITAL_COMMUNITY): Admission: RE | Admit: 2023-11-03 | Source: Ambulatory Visit | Admitting: Internal Medicine

## 2023-11-03 ENCOUNTER — Encounter (HOSPITAL_COMMUNITY): Payer: Self-pay

## 2023-11-03 NOTE — Progress Notes (Incomplete)
 Primary Care Physician: Shepard Ade, MD Referring Physician:Dr. Kelsie   Sean Bowman is a 89 y.o. male with a h/o persistent afib that was diagnosed in December of 2017. He was started on anticoagulation and weeks later had DCCV which was unsuccessful. At time of cardioversion Cardizem  was doubled for better rate control and simvastatin was stopped due to drug drug interaction concerns. He was later started on rosuvastatin .  Echo showed EF of 40-45%. It was decided that he would benefit from restoring SR with tikosyn  with probable TMC. He was admitted for Tikosyn  initiation.He returned to SR and qtc was stable. He was eventually taken off Cardizem  and some ankle edema he had improved and he only takes lasix  as needed. Left on low dose BB. BB was held at one point, and had return of afib.  F/u in afib clinic, 03/16/17, for general  f/u of tikosyn . He is staying in SR. He has had all over joint discomfort including the chest, shoulders and back. He saw a specialist and is thought to have some sort of arthritis and is on prednisone which does help with symptoms. No bleeding issues with eliquis .  F/u in afib clinic 4/22,he is doing well staying in SR with Tikosyn .  He has a HR in the 40's, which is chronic but is not symptomatic. 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 No bleeding issues with eliquis  5 mg bid.  F/u in afib clinic 8/28. He feels well. He again has a HR in the upper 40's but is asymptomatic. He is taking eliquis  on a regular basis. Being compliant with dofetilide  as well.  F/u in afib clinic, 12/14. He appears to be at his baseline. No afib to report just concerned re the slow HR, from  which he does not appear to be symptomatic. We have discussed in the past trying to stop the BB and today, he would like to pursue this. Reports some light nosebleeds.   F/u in afib clinic, 6/9. He feels well. He is staying in SR. No complaints other than very mild dizziness with  position change. He is not currently  on any medication that could contribute to this.   F/u 12/12/18. He reports that he feels well. No issues with afib. Continues on Tikosyn . No issues with anticoagulation.  F/i in afib clinic 03/15/19. He has not noted any afib. Continues on tikosyn  and apixaban  5 mg bid for a CHA2DS2VASc of 3. No issues with bleeding. HR's in the 40's today that has been present in the past. He is not symptomatic nor on rate control.   F/u in afib clinic, 06/13/19.He remains in sinus brady which is  chronic and he tolerates well Has some mild LLE at end of the day that goes away by the next morning. No complaints today. Being compliant with tikosyn  and eliquis . No afib to report.   F/u afib clinic, 09/12/19. No afib to report. Has a resting HR in the 40's but when walked down the hall, the HR picked up appropriately  to the mid 60's. He is not symptomatic with the bradycardia. The bradycardia is symptomatic and he is now on any AV nodal blocking drugs. He feels well. He will soon turn 89. No issues with DOAC, takes Tikosyn  on a regular basis. He has both covid shots.    F/u in afib clinic, 12/17/19. Remains in sinus brady at 51 bpm and reports no afib awareness. He has c/o of dizziness from time to time  but is stating that is more noticeable lately. This is with sitting and standing. He is not on any AV nodal blocking drugs. His BP is elevated today, he does not monitor his HR or BP at home. I did walk him last time he was in the clinic and his HR did pick up appropriately with exercise.  Continues on eliquis , no bleeding issues, CHA2DS2VASc score of 3.   F/u in afib clinic 04/09/20. He saw Dr. Kelsie in November and pt had a low risk stress test. Dr. Kelsie also offered him a PPM, with his low HR's and  intermittent dizziness. Pt declined. He remains in SR with Tikosyn . EKg shows sinus brady at 50 bpm today. He has not noted much dizziness lately. He continues to  work. Qtc stable.   F/u  with afib clinic, 07/24/20. Ekg shows afib with rvr at 134 bpm.  He was unaware. He states that he came down suddenly with fatigue and head congestion 2 days ago. He denies fever, cough, shortness of breath with exertion. PO 97%.  He has been using OC decongestants.  CHA2DS2VASc score of 3. He continues on eliquis . He has not spoken to PCP re recent symptoms.   F/u  in afib clinic, 11/05/20. He did convert to SR with  BB added after last visit in May when he was asymptomatic with afib with RVR. He was sick at the time. After he converted , BB was again made prn, as he is slow in SR. Today EKG shows atrial flutter with RVR at 135 bpm. He is not really aware of being out of rhythm but feels he may have been off for the last several weeks. 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 Denies any recent sickness.he continues on tikosyn  and eliquis .   F/u in afib clinic, 11/12/20, after being found back in afib earlier in week and coming back for EKG's for rhythm check. He initially converted when metoprolol  12.5 mg bid was added. He continues in afib with RVR. I am concerned to push BB as he has a HR in the 40's in the past with low dose BB. He has been asymptomatic with bradycardia in the past and the only issue he has noted recently with the RVR is that he feels anxious in his chest at times. At his advanced age, I am concerned that he could deteriorate with RVR. He was suppose to drive to a wedding in [place]  today but I have advised against this as I am afraid he may become lightheaded behind the wheel. He agrees not to go. He still works running his own business. I discussed with Dr. Kelsie and he feels that tikosyn  should be stopped and amiodarone  started. Pt is in agreement. Dr. Kelsie  would prefer to defer PPM at this point for tachy/brady.   F/u in afib clinic,  11/28/20. He is back in afib clinic  being loaded on amiodarone  for failing Tikosyn  and afib with RVR. He is now on amiodarone  for around 10 days. He appears to be tolerating afib well. Ekg shows afib at 109 bpm. We discussed I will plan to cardiovert in another 2-3 weeks. No missed anticoagulation.  F/u in afib clinic, 12/10/20. He has now been on amiodarone  for one month and will schedule for cardioversion. He is tolerating afib well. He is now on amiodarone  200  mg daily. He states no missed anticoagulation.   F/u in afib clinic 10/27 after successful cardioversion and remains in SR today. He has not noted a lot of improvement but has noted less swelling in his ankles. His energy stayed good when he was in afib with RVR. He is on amiodarone  200 mg daily.   F/u in afib clinic, 11/30, he remains in SR. He feels well.Continues on amiodarone  200 mg daily.   Pt  being seen today ,04/08/21, for his PCP picking up an elevated HR on his visit last week. He did not note anything abnormal. His ekg today shows sinus brady at 45 bpm. He is not symptomatic with this. His BP is elevated today. He has just returned  from a cruise and has had tro bring in food as his wife hurt her leg and has not been able to cook.   F/u in afib clinic, 07/15/21. He is in sinus brady with PAC's. He states that he is not symptomatic.In the past, I have sent to Dr. Kelsie for consideration of a PPM but he felt he was not symptomatic enough to  warrant one. If I reduce amio, he may return to afib with RVR, hence tachy/brady. We discussed placing a monitor today.   F/u in Afib clinic, 02/25/22. He remains in Sinus brady in the 40's but asymptomatic. Labs drawn for amiodarone  use. He feels well. No complaints today. He wore a monitor in June which showed SR with no triggered episodes, no unusual pauses or indication for a PPM. Since he has been in SR with the brady, no recent issues with afib, I will now try to reduce amiodarone   to 100 mg  daily. Pt's wife died in 2023/12/11, complications from a bowel obstruction.No symptoms of afib.   F/u  in afib clinic. He is on amiodarone  100 mg daily and HR 52 bpm. He is not symptomatic with this. He continues to work. No complaints voiced.    F/u in Afib clinic, 10/04/22. He is currently in NSR. Patient's daughter called on 7/10 noting lower extremity swelling without SOB or weight gain noted. Started lasix  20 mg x 5 days and improvement noted so transitioned back to lasix  prn swelling. Daughter noted primarily his left ankle would swell sometimes after a day of walking. He feels well overall. He is currently on amiodarone  100 mg daily. No bleeding issues on Eliquis .   F/u in Afib clinic, 05/27/23. He currently appears to be in atrial flutter with RVR. He believes he felt his heart start racing last week. He takes amiodarone  100 mg daily. No missed doses of Eliquis  5 mg BID.   F/u in Afib clinic, 05/27/23. He is currently still in Afib. He began amiodarone  200 mg BID reload at last office visit. No missed doses of Eliquis  5 mg BID.   Follow up in Afib clinic, 07/13/23. He is currently in Afib. He has had ERAF. S/p successful DCCV on 06/27/23. He is taking amiodarone  200 mg daily. No missed doses of Eliquis .   On follow up 09/12/23, patient is currently in Afib. He would like to try another cardioversion. He feels tired when out of rhythm. No missed doses of Eliquis .   On follow up 11/03/23, patient is currently in ***. He was loaded on amiodarone , presented for cardioversion on 8/4, and procedure canceled due to him arriving in sinus rhythm. His HR was in the 40s and amiodarone  reduced to 200 mg once daily. No missed doses of Eliquis .  Today, he denies symptoms of palpitations,  shortness of breath, orthopnea, PND  dizziness, presyncope, syncope, or neurologic sequela. The patient is tolerating medications without difficulties and is otherwise without complaint today.   Past Medical History:  Diagnosis  Date   Arthralgia    Benign positional vertigo    HTN (hypertension)    Hyperlipidemia    Impaired glucose tolerance    Persistent atrial fibrillation (HCC)    Senile purpura (HCC)    Past Surgical History:  Procedure Laterality Date   CARDIOVERSION N/A 03/29/2016   Procedure: CARDIOVERSION;  Surgeon: Jerel Balding, MD;  Location: [facility];  Service: Cardiovascular;  Laterality: N/A;   CARDIOVERSION N/A 12/22/2020   Procedure: CARDIOVERSION;  Surgeon: Sheena Pugh, DO;  Location: [facility];  Service: Cardiovascular;  Laterality: N/A;   CARDIOVERSION N/A 06/27/2023   Procedure: CARDIOVERSION;  Surgeon: Loni Soyla LABOR, MD;  Location: [facility];  Service: Cardiovascular;  Laterality: N/A;   CATARACT EXTRACTION W/ INTRAOCULAR LENS  IMPLANT, BILATERAL Bilateral 2017   ORIF METACARPAL FRACTURE Left ~ 2016   S/P fall   TONSILLECTOMY      Current Outpatient Medications  Medication Sig Dispense Refill   amiodarone  (PACERONE ) 200 MG tablet Take 1 tablet (200 mg total) by mouth daily. 90 tablet 3   apixaban  (ELIQUIS ) 5 MG TABS tablet Take 1 tablet (5 mg total) by mouth 2 (two) times daily. 180 tablet 3   ascorbic acid (VITAMIN C) 500 MG tablet Take 500 mg by mouth daily.     cholecalciferol (VITAMIN D3) 25 MCG (1000 UNIT) tablet Take 1,000 Units by mouth in the morning and at bedtime.     furosemide  (LASIX ) 20 MG tablet TAKE 1 TABLET (20 MG TOTAL) BY MOUTH DAILY AS NEEDED (SWELLING/FLUID RETENTION). 90 tablet 1   latanoprost (XALATAN) 0.005 % ophthalmic solution Place 1 drop into both eyes at bedtime.     rosuvastatin  (CRESTOR ) 20 MG tablet Take 20 mg by mouth daily.  5   No current facility-administered medications for this visit.    No Known Allergies  ROS- All systems are reviewed and negative except as per the HPI above  Physical Exam: There were no vitals filed for this visit.   Wt Readings from Last 3 Encounters:  09/12/23 73.7 kg  07/13/23 74.8 kg   06/27/23 73 kg    Labs: Lab Results  Component Value Date   NA 140 09/12/2023   K 4.5 09/12/2023   CL 105 09/12/2023   CO2 25 09/12/2023   GLUCOSE 86 09/12/2023   BUN 20 09/12/2023   CREATININE 1.26 09/12/2023   CALCIUM  9.1 09/12/2023   MG 2.2 11/05/2020   GEN- The patient is well appearing, alert and oriented x 3 today.   Neck - no JVD or carotid bruit noted Lungs- Clear to ausculation bilaterally, normal work of breathing Heart- ***Regular rate and rhythm, no murmurs, rubs or gallops, PMI not laterally displaced Extremities- no clubbing, cyanosis, or edema Skin - no rash or ecchymosis noted    EKG-   ***   ECHO 12/06/2016: Study Conclusions   - Left ventricle: The cavity size was normal. Wall thickness was    increased in a pattern of mild LVH. Systolic function was normal.    The estimated ejection fraction was in the range of 50% to 55%.    Doppler parameters are consistent with abnormal left ventricular    relaxation (grade 1 diastolic dysfunction).  - Mitral valve: Moderately calcified annulus. Mildly thickened  leaflets . There was mild regurgitation. Valve area by continuity    equation (using LVOT flow): 1.45 cm^2.    Assessment and Plan: 1. Persistent Afib/flutter  Failed tikosyn , it was stopped and then pt loaded on amiodarone  S/p successful DCCV on 06/27/23.  Patient is currently in ***.   High risk medication monitoring (ICD10: [id number]) Patient requires ongoing monitoring for anti-arrhythmic medication which has the potential to cause life threatening arrhythmias or AV block. Qtc stable. Continue amiodarone  200 mg daily.  2. Secondary hypercoagulable state due to Afib.  Continue OAC.  3. HTN Stable today.    Follow up ***.     Fairy Heinrich, PA-C [facility] [address] [place], [place] [address] [phone]

## 2023-11-23 ENCOUNTER — Other Ambulatory Visit

## 2023-11-29 ENCOUNTER — Ambulatory Visit (HOSPITAL_COMMUNITY)
Admission: RE | Admit: 2023-11-29 | Discharge: 2023-11-29 | Disposition: A | Discharge status: Home / Self Care | Source: Ambulatory Visit | Attending: Internal Medicine | Admitting: Internal Medicine

## 2023-11-29 VITALS — BP 122/76 | HR 76 | Ht 73.0 in | Wt 159.8 lb

## 2023-11-29 DIAGNOSIS — I4891 Unspecified atrial fibrillation: Secondary | ICD-10-CM | POA: Diagnosis not present

## 2023-11-29 DIAGNOSIS — D6869 Other thrombophilia: Secondary | ICD-10-CM

## 2023-11-29 DIAGNOSIS — I4819 Other persistent atrial fibrillation: Secondary | ICD-10-CM

## 2023-11-29 NOTE — Progress Notes (Signed)
 Primary Care Physician: Shepard Ade, MD Referring Physician:Dr. Kelsie   Sean Bowman is a 89 y.o. male with a h/o persistent afib that was diagnosed in December of 2017. He was started on anticoagulation and weeks later had DCCV which was unsuccessful. At time of cardioversion Cardizem  was doubled for better rate control and simvastatin was stopped due to drug drug interaction concerns. He was later started on rosuvastatin .  Echo showed EF of 40-45%. It was decided that he would benefit from restoring SR with tikosyn  with probable TMC. He was admitted for Tikosyn  initiation.He returned to SR and qtc was stable. He was eventually taken off Cardizem  and some ankle edema he had improved and he only takes lasix  as needed. Left on low dose BB. BB was held at one point, and had return of afib.  F/u in afib clinic, 03/16/17, for general  f/u of tikosyn . He is staying in SR. He has had all over joint discomfort including the chest, shoulders and back. He saw a specialist and is thought to have some sort of arthritis and is on prednisone which does help with symptoms. No bleeding issues with eliquis .  F/u in afib clinic 4/22,he is doing well staying in SR with Tikosyn .  He has a HR in the 40's, which is chronic but is not symptomatic. 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 No bleeding issues with eliquis  5 mg bid.  F/u in afib clinic 8/28. He feels well. He again has a HR in the upper 40's but is asymptomatic. He is taking eliquis  on a regular basis. Being compliant with dofetilide  as well.  F/u in afib clinic, 12/14. He appears to be at his baseline. No afib to report just concerned re the slow HR, from  which he does not appear to be symptomatic. We have discussed in the past trying to stop the BB and today, he would like to pursue this. Reports some light nosebleeds.   F/u in afib clinic, 6/9. He feels well. He is staying in SR. No complaints other than very mild dizziness with  position change. He is not currently  on any medication that could contribute to this.   F/u 12/12/18. He reports that he feels well. No issues with afib. Continues on Tikosyn . No issues with anticoagulation.  F/i in afib clinic 03/15/19. He has not noted any afib. Continues on tikosyn  and apixaban  5 mg bid for a CHA2DS2VASc of 3. No issues with bleeding. HR's in the 40's today that has been present in the past. He is not symptomatic nor on rate control.   F/u in afib clinic, 06/13/19.He remains in sinus brady which is  chronic and he tolerates well Has some mild LLE at end of the day that goes away by the next morning. No complaints today. Being compliant with tikosyn  and eliquis . No afib to report.   F/u afib clinic, 09/12/19. No afib to report. Has a resting HR in the 40's but when walked down the hall, the HR picked up appropriately  to the mid 60's. He is not symptomatic with the bradycardia. The bradycardia is symptomatic and he is now on any AV nodal blocking drugs. He feels well. He will soon turn 89. No issues with DOAC, takes Tikosyn  on a regular basis. He has both covid shots.    F/u in afib clinic, 12/17/19. Remains in sinus brady at 51 bpm and reports no afib awareness. He has c/o of dizziness from time to time  but is stating that is more noticeable lately. This is with sitting and standing. He is not on any AV nodal blocking drugs. His BP is elevated today, he does not monitor his HR or BP at home. I did walk him last time he was in the clinic and his HR did pick up appropriately with exercise.  Continues on eliquis , no bleeding issues, CHA2DS2VASc score of 3.   F/u in afib clinic 04/09/20. He saw Dr. Kelsie in November and pt had a low risk stress test. Dr. Kelsie also offered him a PPM, with his low HR's and  intermittent dizziness. Pt declined. He remains in SR with Tikosyn . EKg shows sinus brady at 50 bpm today. He has not noted much dizziness lately. He continues to  work. Qtc stable.   F/u  with afib clinic, 07/24/20. Ekg shows afib with rvr at 134 bpm.  He was unaware. He states that he came down suddenly with fatigue and head congestion 2 days ago. He denies fever, cough, shortness of breath with exertion. PO 97%.  He has been using OC decongestants.  CHA2DS2VASc score of 3. He continues on eliquis . He has not spoken to PCP re recent symptoms.   F/u  in afib clinic, 11/05/20. He did convert to SR with  BB added after last visit in May when he was asymptomatic with afib with RVR. He was sick at the time. After he converted , BB was again made prn, as he is slow in SR. Today EKG shows atrial flutter with RVR at 135 bpm. He is not really aware of being out of rhythm but feels he may have been off for the last several weeks. 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 Denies any recent sickness.he continues on tikosyn  and eliquis .   F/u in afib clinic, 11/12/20, after being found back in afib earlier in week and coming back for EKG's for rhythm check. He initially converted when metoprolol  12.5 mg bid was added. He continues in afib with RVR. I am concerned to push BB as he has a HR in the 40's in the past with low dose BB. He has been asymptomatic with bradycardia in the past and the only issue he has noted recently with the RVR is that he feels anxious in his chest at times. At his advanced age, I am concerned that he could deteriorate with RVR. He was suppose to drive to a wedding in [place]  today but I have advised against this as I am afraid he may become lightheaded behind the wheel. He agrees not to go. He still works running his own business. I discussed with Dr. Kelsie and he feels that tikosyn  should be stopped and amiodarone  started. Pt is in agreement. Dr. Kelsie  would prefer to defer PPM at this point for tachy/brady.   F/u in afib clinic,  11/28/20. He is back in afib clinic  being loaded on amiodarone  for failing Tikosyn  and afib with RVR. He is now on amiodarone  for around 10 days. He appears to be tolerating afib well. Ekg shows afib at 109 bpm. We discussed I will plan to cardiovert in another 2-3 weeks. No missed anticoagulation.  F/u in afib clinic, 12/10/20. He has now been on amiodarone  for one month and will schedule for cardioversion. He is tolerating afib well. He is now on amiodarone  200  mg daily. He states no missed anticoagulation.   F/u in afib clinic 10/27 after successful cardioversion and remains in SR today. He has not noted a lot of improvement but has noted less swelling in his ankles. His energy stayed good when he was in afib with RVR. He is on amiodarone  200 mg daily.   F/u in afib clinic, 11/30, he remains in SR. He feels well.Continues on amiodarone  200 mg daily.   Pt  being seen today ,04/08/21, for his PCP picking up an elevated HR on his visit last week. He did not note anything abnormal. His ekg today shows sinus brady at 45 bpm. He is not symptomatic with this. His BP is elevated today. He has just returned  from a cruise and has had tro bring in food as his wife hurt her leg and has not been able to cook.   F/u in afib clinic, 07/15/21. He is in sinus brady with PAC's. He states that he is not symptomatic.In the past, I have sent to Dr. Kelsie for consideration of a PPM but he felt he was not symptomatic enough to  warrant one. If I reduce amio, he may return to afib with RVR, hence tachy/brady. We discussed placing a monitor today.   F/u in Afib clinic, 02/25/22. He remains in Sinus brady in the 40's but asymptomatic. Labs drawn for amiodarone  use. He feels well. No complaints today. He wore a monitor in June which showed SR with no triggered episodes, no unusual pauses or indication for a PPM. Since he has been in SR with the brady, no recent issues with afib, I will now try to reduce amiodarone   to 100 mg  daily. Pt's wife died in 2024-01-24, complications from a bowel obstruction.No symptoms of afib.   F/u  in afib clinic. He is on amiodarone  100 mg daily and HR 52 bpm. He is not symptomatic with this. He continues to work. No complaints voiced.    F/u in Afib clinic, 10/04/22. He is currently in NSR. Patient's daughter called on 7/10 noting lower extremity swelling without SOB or weight gain noted. Started lasix  20 mg x 5 days and improvement noted so transitioned back to lasix  prn swelling. Daughter noted primarily his left ankle would swell sometimes after a day of walking. He feels well overall. He is currently on amiodarone  100 mg daily. No bleeding issues on Eliquis .   F/u in Afib clinic, 05/27/23. He currently appears to be in atrial flutter with RVR. He believes he felt his heart start racing last week. He takes amiodarone  100 mg daily. No missed doses of Eliquis  5 mg BID.   F/u in Afib clinic, 05/27/23. He is currently still in Afib. He began amiodarone  200 mg BID reload at last office visit. No missed doses of Eliquis  5 mg BID.   Follow up in Afib clinic, 07/13/23. He is currently in Afib. He has had ERAF. S/p successful DCCV on 06/27/23. He is taking amiodarone  200 mg daily. No missed doses of Eliquis .   On follow up 09/12/23, patient is currently in Afib. He would like to try another cardioversion. He feels tired when out of rhythm. No missed doses of Eliquis .   On follow up 11/29/23, patient is currently in Afib. He was loaded on amiodarone , presented for cardioversion on 8/4, and procedure canceled due to him arriving in sinus rhythm. His HR was in the 40s and amiodarone  reduced to 200 mg once daily. No missed doses of Eliquis .  Today, he denies symptoms of palpitations,  shortness of breath, orthopnea, PND  dizziness, presyncope, syncope, or neurologic sequela. The patient is tolerating medications without difficulties and is otherwise without complaint today.   Past Medical History:  Diagnosis  Date   Arthralgia    Benign positional vertigo    HTN (hypertension)    Hyperlipidemia    Impaired glucose tolerance    Persistent atrial fibrillation (HCC)    Senile purpura    Past Surgical History:  Procedure Laterality Date   CARDIOVERSION N/A 03/29/2016   Procedure: CARDIOVERSION;  Surgeon: Jerel Balding, MD;  Location: [facility];  Service: Cardiovascular;  Laterality: N/A;   CARDIOVERSION N/A 12/22/2020   Procedure: CARDIOVERSION;  Surgeon: Sheena Pugh, DO;  Location: [facility];  Service: Cardiovascular;  Laterality: N/A;   CARDIOVERSION N/A 06/27/2023   Procedure: CARDIOVERSION;  Surgeon: Loni Soyla LABOR, MD;  Location: [facility];  Service: Cardiovascular;  Laterality: N/A;   CATARACT EXTRACTION W/ INTRAOCULAR LENS  IMPLANT, BILATERAL Bilateral 2017   ORIF METACARPAL FRACTURE Left ~ 2016   S/P fall   TONSILLECTOMY      Current Outpatient Medications  Medication Sig Dispense Refill   amiodarone  (PACERONE ) 200 MG tablet Take 1 tablet (200 mg total) by mouth daily. 90 tablet 3   apixaban  (ELIQUIS ) 5 MG TABS tablet Take 1 tablet (5 mg total) by mouth 2 (two) times daily. 180 tablet 3   ascorbic acid (VITAMIN C) 500 MG tablet Take 500 mg by mouth daily.     cholecalciferol (VITAMIN D3) 25 MCG (1000 UNIT) tablet Take 1,000 Units by mouth in the morning and at bedtime.     furosemide  (LASIX ) 20 MG tablet TAKE 1 TABLET (20 MG TOTAL) BY MOUTH DAILY AS NEEDED (SWELLING/FLUID RETENTION). 90 tablet 1   latanoprost (XALATAN) 0.005 % ophthalmic solution Place 1 drop into both eyes at bedtime.     rosuvastatin  (CRESTOR ) 20 MG tablet Take 20 mg by mouth daily.  5   No current facility-administered medications for this visit.    No Known Allergies  ROS- All systems are reviewed and negative except as per the HPI above  Physical Exam: There were no vitals filed for this visit.   Wt Readings from Last 3 Encounters:  09/12/23 73.7 kg  07/13/23 74.8 kg  06/27/23 73  kg    Labs: Lab Results  Component Value Date   NA 140 09/12/2023   K 4.5 09/12/2023   CL 105 09/12/2023   CO2 25 09/12/2023   GLUCOSE 86 09/12/2023   BUN 20 09/12/2023   CREATININE 1.26 09/12/2023   CALCIUM  9.1 09/12/2023   MG 2.2 11/05/2020   GEN- The patient is well appearing, alert and oriented x 3 today.   Neck - no JVD or carotid bruit noted Lungs- Clear to ausculation bilaterally, normal work of breathing Heart- Irregular rate and rhythm, no murmurs, rubs or gallops, PMI not laterally displaced Extremities- no clubbing, cyanosis, or edema Skin - no rash or ecchymosis noted   EKG-   Vent. rate 76 BPM PR interval * ms QRS duration 166 ms QT/QTcB 448/504 ms P-R-T axes * -36 12 Atrial fibrillation Left axis deviation Right bundle branch block Abnormal ECG When compared with ECG of 10-Oct-2023 08:03, Atrial fibrillation has replaced Sinus rhythm   ECHO 12/06/2016: Study Conclusions   - Left ventricle: The cavity size was normal. Wall thickness was    increased in a pattern of mild LVH. Systolic function was normal.  The estimated ejection fraction was in the range of 50% to 55%.    Doppler parameters are consistent with abnormal left ventricular    relaxation (grade 1 diastolic dysfunction).  - Mitral valve: Moderately calcified annulus. Mildly thickened    leaflets . There was mild regurgitation. Valve area by continuity    equation (using LVOT flow): 1.45 cm^2.    Assessment and Plan: 1. Persistent Afib/flutter  Failed tikosyn , it was stopped and then pt loaded on amiodarone  S/p successful DCCV on 06/27/23.  Patient is currently in Afib. He had ERAF following amiodarone  reload. I had a discussion with patient as to the persistent of his arrhythmia and benefit/risk of staying on amiodarone . He overall has well controlled heart rate in Afib. We discussed benefit/risk of repeat cardioversion and after discussion will stop amiodarone . Patient will live in rate  controlled Afib, continuing Eliquis  for stroke prevention.   2. Secondary hypercoagulable state due to Afib.  Continue Eliquis  without interruption. Dosage of 5 mg is correct based on weight > 60 kg and creatinine < 1.5.  3. HTN Stable today.    Follow up 1 year Afib clinic.     Fairy Heinrich, PA-C Afib Clinic [facility] [address] [place], [place] [address] [phone]

## 2023-11-29 NOTE — Patient Instructions (Signed)
 Stop Amiodarone

## 2024-01-10 DIAGNOSIS — H401131 Primary open-angle glaucoma, bilateral, mild stage: Secondary | ICD-10-CM | POA: Diagnosis not present

## 2024-01-10 DIAGNOSIS — H04123 Dry eye syndrome of bilateral lacrimal glands: Secondary | ICD-10-CM | POA: Diagnosis not present

## 2024-01-10 DIAGNOSIS — Z961 Presence of intraocular lens: Secondary | ICD-10-CM | POA: Diagnosis not present

## 2024-01-10 DIAGNOSIS — H18413 Arcus senilis, bilateral: Secondary | ICD-10-CM | POA: Diagnosis not present

## 2024-01-17 DIAGNOSIS — D485 Neoplasm of uncertain behavior of skin: Secondary | ICD-10-CM | POA: Diagnosis not present

## 2024-01-17 DIAGNOSIS — D0439 Carcinoma in situ of skin of other parts of face: Secondary | ICD-10-CM | POA: Diagnosis not present

## 2024-01-17 DIAGNOSIS — D044 Carcinoma in situ of skin of scalp and neck: Secondary | ICD-10-CM | POA: Diagnosis not present

## 2024-03-23 ENCOUNTER — Ambulatory Visit (HOSPITAL_COMMUNITY)
Admission: RE | Admit: 2024-03-23 | Discharge: 2024-03-23 | Disposition: A | Discharge status: Home / Self Care | Source: Ambulatory Visit | Attending: Physician Assistant | Admitting: Physician Assistant

## 2024-03-23 VITALS — BP 116/80 | HR 130 | Ht 73.0 in | Wt 164.6 lb

## 2024-03-23 DIAGNOSIS — D6869 Other thrombophilia: Secondary | ICD-10-CM | POA: Diagnosis not present

## 2024-03-23 DIAGNOSIS — I4821 Permanent atrial fibrillation: Secondary | ICD-10-CM | POA: Diagnosis not present

## 2024-03-23 DIAGNOSIS — I4891 Unspecified atrial fibrillation: Secondary | ICD-10-CM

## 2024-03-23 MED ORDER — METOPROLOL SUCCINATE ER 25 MG PO TB24: 25.0000 mg | ORAL_TABLET | Freq: Every day | ORAL | 4 refills | Status: DC

## 2024-03-23 NOTE — Patient Instructions (Addendum)
Start metoprolol 25mg once a day 

## 2024-03-23 NOTE — Progress Notes (Signed)
 "  Primary Care Physician: Shepard Ade, MD Referring Physician:Dr. Kelsie   Sean Bowman is a 89 y.o. male with a h/o persistent afib that was diagnosed in December of 2017. He was started on anticoagulation and weeks later had DCCV which was unsuccessful. At time of cardioversion Cardizem  was doubled for better rate control and simvastatin was stopped due to drug drug interaction concerns. He was later started on rosuvastatin .  Echo showed EF of 40-45%. It was decided that he would benefit from restoring SR with tikosyn  with probable TMC. He was admitted for Tikosyn  initiation.He returned to SR and qtc was stable. He was eventually taken off Cardizem  and some ankle edema he had improved and he only takes lasix  as needed. Left on low dose BB. BB was held at one point, and had return of afib.  F/u in afib clinic, 03/16/17, for general  f/u of tikosyn . He is staying in SR. He has had all over joint discomfort including the chest, shoulders and back. He saw a specialist and is thought to have some sort of arthritis and is on prednisone which does help with symptoms. No bleeding issues with eliquis .  F/u in afib clinic 4/22,he is doing well staying in SR with Tikosyn .  He has a HR in the 40's, which is chronic but is not symptomatic. 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 No bleeding issues with eliquis  5 mg bid.  F/u in afib clinic 8/28. He feels well. He again has a HR in the upper 40's but is asymptomatic. He is taking eliquis  on a regular basis. Being compliant with dofetilide  as well.  F/u in afib clinic, 12/14. He appears to be at his baseline. No afib to report just concerned re the slow HR, from  which he does not appear to be symptomatic. We have discussed in the past trying to stop the BB and today, he would like to pursue this. Reports some light nosebleeds.   F/u in afib clinic, 6/9. He feels well. He is staying in SR. No complaints other than very mild dizziness with  position change. He is not currently  on any medication that could contribute to this.   F/u 12/12/18. He reports that he feels well. No issues with afib. Continues on Tikosyn . No issues with anticoagulation.  F/i in afib clinic 03/15/19. He has not noted any afib. Continues on tikosyn  and apixaban  5 mg bid for a CHA2DS2VASc of 3. No issues with bleeding. HR's in the 40's today that has been present in the past. He is not symptomatic nor on rate control.   F/u in afib clinic, 06/13/19.He remains in sinus brady which is  chronic and he tolerates well Has some mild LLE at end of the day that goes away by the next morning. No complaints today. Being compliant with tikosyn  and eliquis . No afib to report.   F/u afib clinic, 09/12/19. No afib to report. Has a resting HR in the 40's but when walked down the hall, the HR picked up appropriately  to the mid 60's. He is not symptomatic with the bradycardia. The bradycardia is symptomatic and he is now on any AV nodal blocking drugs. He feels well. He will soon turn 89. No issues with DOAC, takes Tikosyn  on a regular basis. He has both covid shots.    F/u in afib clinic, 12/17/19. Remains in sinus brady at 51 bpm and reports no afib awareness. He has c/o of dizziness from time to  time but is stating that is more noticeable lately. This is with sitting and standing. He is not on any AV nodal blocking drugs. His BP is elevated today, he does not monitor his HR or BP at home. I did walk him last time he was in the clinic and his HR did pick up appropriately with exercise.  Continues on eliquis , no bleeding issues, CHA2DS2VASc score of 3.   F/u in afib clinic 04/09/20. He saw Dr. Kelsie in November and pt had a low risk stress test. Dr. Kelsie also offered him a PPM, with his low HR's and  intermittent dizziness. Pt declined. He remains in SR with Tikosyn . EKg shows sinus brady at 50 bpm today. He has not noted much dizziness lately. He continues to  work. Qtc stable.   F/u  with afib clinic, 07/24/20. Ekg shows afib with rvr at 134 bpm.  He was unaware. He states that he came down suddenly with fatigue and head congestion 2 days ago. He denies fever, cough, shortness of breath with exertion. PO 97%.  He has been using OC decongestants.  CHA2DS2VASc score of 3. He continues on eliquis . He has not spoken to PCP re recent symptoms.   F/u  in afib clinic, 11/05/20. He did convert to SR with  BB added after last visit in May when he was asymptomatic with afib with RVR. He was sick at the time. After he converted , BB was again made prn, as he is slow in SR. Today EKG shows atrial flutter with RVR at 135 bpm. He is not really aware of being out of rhythm but feels he may have been off for the last several weeks. 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 Denies any recent sickness.he continues on tikosyn  and eliquis .   F/u in afib clinic, 11/12/20, after being found back in afib earlier in week and coming back for EKG's for rhythm check. He initially converted when metoprolol  12.5 mg bid was added. He continues in afib with RVR. I am concerned to push BB as he has a HR in the 40's in the past with low dose BB. He has been asymptomatic with bradycardia in the past and the only issue he has noted recently with the RVR is that he feels anxious in his chest at times. At his advanced age, I am concerned that he could deteriorate with RVR. He was suppose to drive to a wedding in [place]  today but I have advised against this as I am afraid he may become lightheaded behind the wheel. He agrees not to go. He still works running his own business. I discussed with Dr. Kelsie and he feels that tikosyn  should be stopped and amiodarone  started. Pt is in agreement. Dr. Kelsie  would prefer to defer PPM at this point for tachy/brady.   F/u in afib clinic,  11/28/20. He is back in afib clinic  being loaded on amiodarone  for failing Tikosyn  and afib with RVR. He is now on amiodarone  for around 10 days. He appears to be tolerating afib well. Ekg shows afib at 109 bpm. We discussed I will plan to cardiovert in another 2-3 weeks. No missed anticoagulation.  F/u in afib clinic, 12/10/20. He has now been on amiodarone  for one month and will schedule for cardioversion. He is tolerating afib well. He is now on amiodarone   200 mg daily. He states no missed anticoagulation.   F/u in afib clinic 10/27 after successful cardioversion and remains in SR today. He has not noted a lot of improvement but has noted less swelling in his ankles. His energy stayed good when he was in afib with RVR. He is on amiodarone  200 mg daily.   F/u in afib clinic, 11/30, he remains in SR. He feels well.Continues on amiodarone  200 mg daily.   Pt  being seen today ,04/08/21, for his PCP picking up an elevated HR on his visit last week. He did not note anything abnormal. His ekg today shows sinus brady at 45 bpm. He is not symptomatic with this. His BP is elevated today. He has just returned  from a cruise and has had tro bring in food as his wife hurt her leg and has not been able to cook.   F/u in afib clinic, 07/15/21. He is in sinus brady with PAC's. He states that he is not symptomatic.In the past, I have sent to Dr. Kelsie for consideration of a PPM but he felt he was not symptomatic enough to  warrant one. If I reduce amio, he may return to afib with RVR, hence tachy/brady. We discussed placing a monitor today.   F/u in Afib clinic, 02/25/22. He remains in Sinus brady in the 40's but asymptomatic. Labs drawn for amiodarone  use. He feels well. No complaints today. He wore a monitor in June which showed SR with no triggered episodes, no unusual pauses or indication for a PPM. Since he has been in SR with the brady, no recent issues with afib, I will now try to reduce amiodarone   to 100 mg  daily. Pt's wife died in 01-06-2025, complications from a bowel obstruction.No symptoms of afib.   F/u  in afib clinic. He is on amiodarone  100 mg daily and HR 52 bpm. He is not symptomatic with this. He continues to work. No complaints voiced.    F/u in Afib clinic, 10/04/22. He is currently in NSR. Patient's daughter called on 7/10 noting lower extremity swelling without SOB or weight gain noted. Started lasix  20 mg x 5 days and improvement noted so transitioned back to lasix  prn swelling. Daughter noted primarily his left ankle would swell sometimes after a day of walking. He feels well overall. He is currently on amiodarone  100 mg daily. No bleeding issues on Eliquis .   F/u in Afib clinic, 05/27/23. He currently appears to be in atrial flutter with RVR. He believes he felt his heart start racing last week. He takes amiodarone  100 mg daily. No missed doses of Eliquis  5 mg BID.   F/u in Afib clinic, 05/27/23. He is currently still in Afib. He began amiodarone  200 mg BID reload at last office visit. No missed doses of Eliquis  5 mg BID.   Follow up in Afib clinic, 07/13/23. He is currently in Afib. He has had ERAF. S/p successful DCCV on 06/27/23. He is taking amiodarone  200 mg daily. No missed doses of Eliquis .   On follow up 09/12/23, patient is currently in Afib. He would like to try another cardioversion. He feels tired when out of rhythm. No missed doses of Eliquis .   On follow up 11/29/23, patient is currently in Afib. He was loaded on amiodarone , presented for cardioversion on 8/4, and procedure canceled due to him arriving in sinus rhythm. His HR was in the 40s and amiodarone  reduced to 200 mg once daily. No missed doses of Eliquis .  Follow up 03/23/24. Patient returns for follow up for atrial fibrillation. Patient called the clinic today reporting elevated heart rates. Amiodarone  was discontinued at his previous visit in favor of a rate control strategy. He feels like his heart is running a race. He  has had these symptoms for about one month. No bleeding issues on anticoagulation.   Today, he  denies symptoms of chest pain, shortness of breath, orthopnea, PND, lower extremity edema, dizziness, presyncope, syncope, snoring, daytime somnolence, bleeding, or neurologic sequela. The patient is tolerating medications without difficulties and is otherwise without complaint today.    Past Medical History:  Diagnosis Date   Arthralgia    Benign positional vertigo    HTN (hypertension)    Hyperlipidemia    Impaired glucose tolerance    Persistent atrial fibrillation (HCC)    Senile purpura    Past Surgical History:  Procedure Laterality Date   CARDIOVERSION N/A 03/29/2016   Procedure: CARDIOVERSION;  Surgeon: Jerel Balding, MD;  Location: [facility];  Service: Cardiovascular;  Laterality: N/A;   CARDIOVERSION N/A 12/22/2020   Procedure: CARDIOVERSION;  Surgeon: Sheena Pugh, DO;  Location: [facility];  Service: Cardiovascular;  Laterality: N/A;   CARDIOVERSION N/A 06/27/2023   Procedure: CARDIOVERSION;  Surgeon: Loni Soyla LABOR, MD;  Location: [facility];  Service: Cardiovascular;  Laterality: N/A;   CATARACT EXTRACTION W/ INTRAOCULAR LENS  IMPLANT, BILATERAL Bilateral 2017   ORIF METACARPAL FRACTURE Left ~ 2016   S/P fall   TONSILLECTOMY      Current Outpatient Medications  Medication Sig Dispense Refill   apixaban  (ELIQUIS ) 5 MG TABS tablet Take 1 tablet (5 mg total) by mouth 2 (two) times daily. 180 tablet 3   ascorbic acid (VITAMIN C) 500 MG tablet Take 500 mg by mouth daily.     cholecalciferol (VITAMIN D3) 25 MCG (1000 UNIT) tablet Take 1,000 Units by mouth in the morning and at bedtime.     furosemide  (LASIX ) 20 MG tablet TAKE 1 TABLET (20 MG TOTAL) BY MOUTH DAILY AS NEEDED (SWELLING/FLUID RETENTION). (Patient taking differently: Take 20 mg by mouth as needed (swelling/fluid retention).) 90 tablet 1   metoprolol  succinate (TOPROL  XL) 25 MG 24 hr tablet Take 1  tablet (25 mg total) by mouth daily. 30 tablet 4   rosuvastatin  (CRESTOR ) 20 MG tablet Take 20 mg by mouth daily.  5   No current facility-administered medications for this encounter.     ROS- All systems are reviewed and negative except as per the HPI above  Physical Exam: Vitals:   03/23/24 1324  BP: 116/80  Pulse: (!) 130  Weight: 74.7 kg  Height: 6' 1 (1.854 m)     Wt Readings from Last 3 Encounters:  03/23/24 74.7 kg  11/29/23 72.5 kg  09/12/23 73.7 kg    GEN: Well nourished, well developed in no acute distress CARDIAC: Irregularly irregular rate and rhythm, no murmurs, rubs, gallops RESPIRATORY:  Clear to auscultation without rales, wheezing or rhonchi  ABDOMEN: Soft, non-tender, non-distended EXTREMITIES:  No edema; No deformity    EKG Interpretation Date/Time:  Friday March 23 2024 13:35:43 EST Ventricular Rate:  130 PR Interval:    QRS Duration:  152 QT Interval:  372 QTC Calculation: 547 R Axis:   12  Text Interpretation: Atrial fibrillation with rapid ventricular response with premature ventricular or aberrantly conducted complexes Right bundle branch block Abnormal ECG When compared with ECG of 29-Nov-2023 10:39, Vent. rate has increased Confirmed by Anayi Bricco ([id number]) on 03/23/2024  1:48:29 PM    ECHO 12/06/2016: Study Conclusions   - Left ventricle: The cavity size was normal. Wall thickness was    increased in a pattern of mild LVH. Systolic function was normal.    The estimated ejection fraction was in the range of 50% to 55%.    Doppler parameters are consistent with abnormal left ventricular    relaxation (grade 1 diastolic dysfunction).  - Mitral valve: Moderately calcified annulus. Mildly thickened    leaflets . There was mild regurgitation. Valve area by continuity    equation (using LVOT flow): 1.45 cm^2.    CHA2DS2-VASc Score = 3  The patient's score is based upon: CHF History: 0 HTN History: 1 Diabetes History: 0 Stroke History:  0 Vascular Disease History: 0 Age Score: 2 Gender Score: 0       ASSESSMENT AND PLAN: Permanent Atrial Fibrillation (ICD10:  I48.11) The patient's CHA2DS2-VASc score is 3, indicating a 3.2% annual risk of stroke.   Patient in afib with elevated rates. Suspect rates have increased as amiodarone  washed out of his symptoms.  Will resume Toprol  25 mg daily to help with rate control.  Continue Eliquis  5 mg BID  Secondary Hypercoagulable State (ICD10:  D68.69) The patient is at significant risk for stroke/thromboembolism based upon his CHA2DS2-VASc Score of 3.  Continue Apixaban  (Eliquis ). No bleeding issues.   HTN Stable on current regimen   Follow up in the AF clinic in 1-2 weeks with Thom Heinrich.     Daril Kicks PA-C [facility] [address] [place], [place] [address] [phone] "

## 2024-04-02 ENCOUNTER — Ambulatory Visit (HOSPITAL_COMMUNITY): Admitting: Internal Medicine

## 2024-04-04 ENCOUNTER — Ambulatory Visit (HOSPITAL_COMMUNITY)
Admission: RE | Admit: 2024-04-04 | Discharge: 2024-04-04 | Disposition: A | Discharge status: Home / Self Care | Source: Ambulatory Visit | Attending: Internal Medicine | Admitting: Internal Medicine

## 2024-04-04 ENCOUNTER — Encounter (HOSPITAL_COMMUNITY): Payer: Self-pay | Admitting: Internal Medicine

## 2024-04-04 VITALS — BP 136/84 | HR 91 | Ht 73.0 in | Wt 162.4 lb

## 2024-04-04 DIAGNOSIS — D6869 Other thrombophilia: Secondary | ICD-10-CM

## 2024-04-04 DIAGNOSIS — I4821 Permanent atrial fibrillation: Secondary | ICD-10-CM

## 2024-04-04 DIAGNOSIS — I4819 Other persistent atrial fibrillation: Secondary | ICD-10-CM

## 2024-04-04 MED ORDER — METOPROLOL SUCCINATE ER 50 MG PO TB24: 50.0000 mg | ORAL_TABLET | Freq: Every day | ORAL | 4 refills | Status: AC

## 2024-04-04 NOTE — Progress Notes (Signed)
 "  Primary Care Physician: Shepard Ade, MD Referring Physician:Dr. Kelsie   Sean Bowman is a 89 y.o. male with a h/o persistent afib that was diagnosed in December of 2017. He was started on anticoagulation and weeks later had DCCV which was unsuccessful. At time of cardioversion Cardizem  was doubled for better rate control and simvastatin was stopped due to drug drug interaction concerns. He was later started on rosuvastatin .  Echo showed EF of 40-45%. It was decided that he would benefit from restoring SR with tikosyn  with probable TMC. He was admitted for Tikosyn  initiation.He returned to SR and qtc was stable. He was eventually taken off Cardizem  and some ankle edema he had improved and he only takes lasix  as needed. Left on low dose BB. BB was held at one point, and had return of afib.  F/u in afib clinic, 03/16/17, for general  f/u of tikosyn . He is staying in SR. He has had all over joint discomfort including the chest, shoulders and back. He saw a specialist and is thought to have some sort of arthritis and is on prednisone which does help with symptoms. No bleeding issues with eliquis .  F/u in afib clinic 4/22,he is doing well staying in SR with Tikosyn .  He has a HR in the 40's, which is chronic but is not symptomatic. 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 No bleeding issues with eliquis  5 mg bid.  F/u in afib clinic 8/28. He feels well. He again has a HR in the upper 40's but is asymptomatic. He is taking eliquis  on a regular basis. Being compliant with dofetilide  as well.  F/u in afib clinic, 12/14. He appears to be at his baseline. No afib to report just concerned re the slow HR, from  which he does not appear to be symptomatic. We have discussed in the past trying to stop the BB and today, he would like to pursue this. Reports some light nosebleeds.   F/u in afib clinic, 6/9. He feels well. He is staying in SR. No complaints other than very mild dizziness with  position change. He is not currently  on any medication that could contribute to this.   F/u 12/12/18. He reports that he feels well. No issues with afib. Continues on Tikosyn . No issues with anticoagulation.  F/i in afib clinic 03/15/19. He has not noted any afib. Continues on tikosyn  and apixaban  5 mg bid for a CHA2DS2VASc of 3. No issues with bleeding. HR's in the 40's today that has been present in the past. He is not symptomatic nor on rate control.   F/u in afib clinic, 06/13/19.He remains in sinus brady which is  chronic and he tolerates well Has some mild LLE at end of the day that goes away by the next morning. No complaints today. Being compliant with tikosyn  and eliquis . No afib to report.   F/u afib clinic, 09/12/19. No afib to report. Has a resting HR in the 40's but when walked down the hall, the HR picked up appropriately  to the mid 60's. He is not symptomatic with the bradycardia. The bradycardia is symptomatic and he is now on any AV nodal blocking drugs. He feels well. He will soon turn 89. No issues with DOAC, takes Tikosyn  on a regular basis. He has both covid shots.    F/u in afib clinic, 12/17/19. Remains in sinus brady at 51 bpm and reports no afib awareness. He has c/o of dizziness from time to  time but is stating that is more noticeable lately. This is with sitting and standing. He is not on any AV nodal blocking drugs. His BP is elevated today, he does not monitor his HR or BP at home. I did walk him last time he was in the clinic and his HR did pick up appropriately with exercise.  Continues on eliquis , no bleeding issues, CHA2DS2VASc score of 3.   F/u in afib clinic 04/09/20. He saw Dr. Kelsie in November and pt had a low risk stress test. Dr. Kelsie also offered him a PPM, with his low HR's and  intermittent dizziness. Pt declined. He remains in SR with Tikosyn . EKg shows sinus brady at 50 bpm today. He has not noted much dizziness lately. He continues to  work. Qtc stable.   F/u  with afib clinic, 07/24/20. Ekg shows afib with rvr at 134 bpm.  He was unaware. He states that he came down suddenly with fatigue and head congestion 2 days ago. He denies fever, cough, shortness of breath with exertion. PO 97%.  He has been using OC decongestants.  CHA2DS2VASc score of 3. He continues on eliquis . He has not spoken to PCP re recent symptoms.   F/u  in afib clinic, 11/05/20. He did convert to SR with  BB added after last visit in May when he was asymptomatic with afib with RVR. He was sick at the time. After he converted , BB was again made prn, as he is slow in SR. Today EKG shows atrial flutter with RVR at 135 bpm. He is not really aware of being out of rhythm but feels he may have been off for the last several weeks. 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 Denies any recent sickness.he continues on tikosyn  and eliquis .   F/u in afib clinic, 11/12/20, after being found back in afib earlier in week and coming back for EKG's for rhythm check. He initially converted when metoprolol  12.5 mg bid was added. He continues in afib with RVR. I am concerned to push BB as he has a HR in the 40's in the past with low dose BB. He has been asymptomatic with bradycardia in the past and the only issue he has noted recently with the RVR is that he feels anxious in his chest at times. At his advanced age, I am concerned that he could deteriorate with RVR. He was suppose to drive to a wedding in [place]  today but I have advised against this as I am afraid he may become lightheaded behind the wheel. He agrees not to go. He still works running his own business. I discussed with Dr. Kelsie and he feels that tikosyn  should be stopped and amiodarone  started. Pt is in agreement. Dr. Kelsie  would prefer to defer PPM at this point for tachy/brady.   F/u in afib clinic,  11/28/20. He is back in afib clinic  being loaded on amiodarone  for failing Tikosyn  and afib with RVR. He is now on amiodarone  for around 10 days. He appears to be tolerating afib well. Ekg shows afib at 109 bpm. We discussed I will plan to cardiovert in another 2-3 weeks. No missed anticoagulation.  F/u in afib clinic, 12/10/20. He has now been on amiodarone  for one month and will schedule for cardioversion. He is tolerating afib well. He is now on amiodarone   200 mg daily. He states no missed anticoagulation.   F/u in afib clinic 10/27 after successful cardioversion and remains in SR today. He has not noted a lot of improvement but has noted less swelling in his ankles. His energy stayed good when he was in afib with RVR. He is on amiodarone  200 mg daily.   F/u in afib clinic, 11/30, he remains in SR. He feels well.Continues on amiodarone  200 mg daily.   Pt  being seen today ,04/08/21, for his PCP picking up an elevated HR on his visit last week. He did not note anything abnormal. His ekg today shows sinus brady at 45 bpm. He is not symptomatic with this. His BP is elevated today. He has just returned  from a cruise and has had tro bring in food as his wife hurt her leg and has not been able to cook.   F/u in afib clinic, 07/15/21. He is in sinus brady with PAC's. He states that he is not symptomatic.In the past, I have sent to Dr. Kelsie for consideration of a PPM but he felt he was not symptomatic enough to  warrant one. If I reduce amio, he may return to afib with RVR, hence tachy/brady. We discussed placing a monitor today.   F/u in Afib clinic, 02/25/22. He remains in Sinus brady in the 40's but asymptomatic. Labs drawn for amiodarone  use. He feels well. No complaints today. He wore a monitor in June which showed SR with no triggered episodes, no unusual pauses or indication for a PPM. Since he has been in SR with the brady, no recent issues with afib, I will now try to reduce amiodarone   to 100 mg  daily. Pt's wife died in 2024-12-15, complications from a bowel obstruction.No symptoms of afib.   F/u  in afib clinic. He is on amiodarone  100 mg daily and HR 52 bpm. He is not symptomatic with this. He continues to work. No complaints voiced.    F/u in Afib clinic, 10/04/22. He is currently in NSR. Patient's daughter called on 7/10 noting lower extremity swelling without SOB or weight gain noted. Started lasix  20 mg x 5 days and improvement noted so transitioned back to lasix  prn swelling. Daughter noted primarily his left ankle would swell sometimes after a day of walking. He feels well overall. He is currently on amiodarone  100 mg daily. No bleeding issues on Eliquis .   F/u in Afib clinic, 05/27/23. He currently appears to be in atrial flutter with RVR. He believes he felt his heart start racing last week. He takes amiodarone  100 mg daily. No missed doses of Eliquis  5 mg BID.   F/u in Afib clinic, 05/27/23. He is currently still in Afib. He began amiodarone  200 mg BID reload at last office visit. No missed doses of Eliquis  5 mg BID.   Follow up in Afib clinic, 07/13/23. He is currently in Afib. He has had ERAF. S/p successful DCCV on 06/27/23. He is taking amiodarone  200 mg daily. No missed doses of Eliquis .   On follow up 09/12/23, patient is currently in Afib. He would like to try another cardioversion. He feels tired when out of rhythm. No missed doses of Eliquis .   On follow up 11/29/23, patient is currently in Afib. He was loaded on amiodarone , presented for cardioversion on 8/4, and procedure canceled due to him arriving in sinus rhythm. His HR was in the 40s and amiodarone  reduced to 200 mg once daily. No missed doses of Eliquis .  Follow up 03/23/24. Patient returns for follow up for atrial fibrillation. Patient called the clinic today reporting elevated heart rates. Amiodarone  was discontinued at his previous visit in favor of a rate control strategy. He feels like his heart is running a race. He  has had these symptoms for about one month. No bleeding issues on anticoagulation.   Follow-up 04/04/2024.  Patient is currently in A-fib.  He was seen at last visit for RVR and restarted Toprol  25 mg daily.  Patient still notes intermittent heart racing since starting Toprol , although it is less frequent.  No bleeding issues on Eliquis .  Today, he  denies symptoms of chest pain, shortness of breath, orthopnea, PND, lower extremity edema, dizziness, presyncope, syncope, snoring, daytime somnolence, bleeding, or neurologic sequela. The patient is tolerating medications without difficulties and is otherwise without complaint today.    Past Medical History:  Diagnosis Date   Arthralgia    Benign positional vertigo    HTN (hypertension)    Hyperlipidemia    Impaired glucose tolerance    Persistent atrial fibrillation (HCC)    Senile purpura    Past Surgical History:  Procedure Laterality Date   CARDIOVERSION N/A 03/29/2016   Procedure: CARDIOVERSION;  Surgeon: Jerel Balding, MD;  Location: [facility];  Service: Cardiovascular;  Laterality: N/A;   CARDIOVERSION N/A 12/22/2020   Procedure: CARDIOVERSION;  Surgeon: Sheena Pugh, DO;  Location: [facility];  Service: Cardiovascular;  Laterality: N/A;   CARDIOVERSION N/A 06/27/2023   Procedure: CARDIOVERSION;  Surgeon: Loni Soyla LABOR, MD;  Location: [facility]AS[facility] CV [facility];  Service: Cardiovascular;  Laterality: N/A;   CATARACT EXTRACTION W/ INTRAOCULAR LENS  IMPLANT, BILATERAL Bilateral 2017   ORIF METACARPAL FRACTURE Left ~ 2016   S/P fall   TONSILLECTOMY      Current Outpatient Medications  Medication Sig Dispense Refill   apixaban  (ELIQUIS ) 5 MG TABS tablet Take 1 tablet (5 mg total) by mouth 2 (two) times daily. 180 tablet 3   ascorbic acid (VITAMIN C) 500 MG tablet Take 500 mg by mouth daily.     cholecalciferol (VITAMIN D3) 25 MCG (1000 UNIT) tablet Take 1,000 Units by mouth in the morning and at bedtime.     furosemide  (LASIX ) 20  MG tablet TAKE 1 TABLET (20 MG TOTAL) BY MOUTH DAILY AS NEEDED (SWELLING/FLUID RETENTION). 90 tablet 1   rosuvastatin  (CRESTOR ) 20 MG tablet Take 20 mg by mouth daily.  5   metoprolol  succinate (TOPROL  XL) 50 MG 24 hr tablet Take 1 tablet (50 mg total) by mouth daily. 30 tablet 4   No current facility-administered medications for this encounter.     ROS- All systems are reviewed and negative except as per the HPI above  Physical Exam: Vitals:   04/04/24 1538  BP: 136/84  Pulse: 91  Weight: 73.7 kg  Height: 6' 1 (1.854 m)      Wt Readings from Last 3 Encounters:  04/04/24 73.7 kg  03/23/24 74.7 kg  11/29/23 72.5 kg    GEN- The patient is well appearing, alert and oriented x 3 today.   Neck - no JVD or carotid bruit noted Lungs- Clear to ausculation bilaterally, normal work of breathing Heart- Irregular rate and rhythm, no murmurs, rubs or gallops, PMI not laterally displaced Extremities- no clubbing, cyanosis, or edema Skin - no rash or ecchymosis noted   ECG: EKG Interpretation Date/Time:  Wednesday April 04 2024 15:41:24 EST Ventricular Rate:  91 PR Interval:    QRS Duration:  106 QT  Interval:  380 QTC Calculation: 467 R Axis:   17  Text Interpretation: Atrial fibrillation Incomplete right bundle branch block Abnormal ECG When compared with ECG of 23-Mar-2024 13:35, PREVIOUS ECG IS PRESENT Confirmed by Bowman Pac [phone]) on 04/04/2024 3:58:05 PM    ECHO 12/06/2016: Study Conclusions   - Left ventricle: The cavity size was normal. Wall thickness was    increased in a pattern of mild LVH. Systolic function was normal.    The estimated ejection fraction was in the range of 50% to 55%.    Doppler parameters are consistent with abnormal left ventricular    relaxation (grade 1 diastolic dysfunction).  - Mitral valve: Moderately calcified annulus. Mildly thickened    leaflets . There was mild regurgitation. Valve area by continuity    equation (using LVOT flow):  1.45 cm^2.    CHA2DS2-VASc Score = 3  The patient's score is based upon: CHF History: 0 HTN History: 1 Diabetes History: 0 Stroke History: 0 Vascular Disease History: 0 Age Score: 2 Gender Score: 0       ASSESSMENT AND PLAN: Permanent Atrial Fibrillation (ICD10:  I48.11) The patient's CHA2DS2-VASc score is 3, indicating a 3.2% annual risk of stroke.    Patient is currently in Afib with rate control strategy having failed amiodarone . He was seen on 1/16 for elevated HR. He resumed Toprol  25 mg daily for assistance with elevated HR.  His heart rates appear to be better controlled but he still notes intermittent heart racing.  After discussion, we will increase Toprol  to 50 mg daily.  Patient advised to contact clinic next week for update if he is still noting intermittent heart racing.  Secondary Hypercoagulable State (ICD10:  D68.69) The patient is at significant risk for stroke/thromboembolism based upon his CHA2DS2-VASc Score of 3.  Continue Apixaban  (Eliquis ).  No bleeding issues.  Continue Eliquis  5 mg twice daily.  We revisited the emphasis on compliance with this medication to avoid stroke secondary to A-fib.  Dosage is correct based on weight greater than 60 kg and creatinine less than 1.5.   HTN Stable today.    Follow up 3 months A-fib clinic.    Sean Terra, PA-C Afib Clinic [facility] [address] [place], [place] [address] [phone] "

## 2024-04-04 NOTE — Patient Instructions (Signed)
 Increase metoprolol  50mg  once a day - call with update of heart racing [phone]

## 2024-04-13 ENCOUNTER — Ambulatory Visit (HOSPITAL_COMMUNITY): Admitting: Internal Medicine

## 2024-07-04 ENCOUNTER — Ambulatory Visit (HOSPITAL_COMMUNITY): Admitting: Internal Medicine
